# Patient Record
Sex: Female | Born: 1970 | Race: Black or African American | Hispanic: No | Smoking: Never smoker
Health system: Southern US, Community
[De-identification: ages and names within clinical notes are randomized; demographics above are authoritative.]

## PROBLEM LIST (undated history)

## (undated) DIAGNOSIS — E611 Iron deficiency: Secondary | ICD-10-CM

## (undated) DIAGNOSIS — J189 Pneumonia, unspecified organism: Secondary | ICD-10-CM

## (undated) DIAGNOSIS — M3320 Polymyositis, organ involvement unspecified: Secondary | ICD-10-CM

## (undated) DIAGNOSIS — R7989 Other specified abnormal findings of blood chemistry: Secondary | ICD-10-CM

## (undated) DIAGNOSIS — D649 Anemia, unspecified: Secondary | ICD-10-CM

## (undated) DIAGNOSIS — Z8719 Personal history of other diseases of the digestive system: Secondary | ICD-10-CM

## (undated) DIAGNOSIS — M1990 Unspecified osteoarthritis, unspecified site: Secondary | ICD-10-CM

## (undated) DIAGNOSIS — K219 Gastro-esophageal reflux disease without esophagitis: Secondary | ICD-10-CM

## (undated) DIAGNOSIS — N189 Chronic kidney disease, unspecified: Secondary | ICD-10-CM

## (undated) DIAGNOSIS — M3500 Sicca syndrome, unspecified: Secondary | ICD-10-CM

## (undated) HISTORY — DX: Anemia, unspecified: D64.9

## (undated) HISTORY — DX: Other specified abnormal findings of blood chemistry: R79.89

## (undated) HISTORY — PX: KNEE SURGERY: SHX244

## (undated) HISTORY — PX: BUNIONECTOMY: SHX129

## (undated) HISTORY — PX: MUSCLE BIOPSY: SHX716

## (undated) HISTORY — DX: Sjogren syndrome, unspecified: M35.00

## (undated) HISTORY — PX: PLANTAR FASCIA SURGERY: SHX746

## (undated) HISTORY — PX: SHOULDER ARTHROSCOPY: SHX128

## (undated) HISTORY — DX: Iron deficiency: E61.1

---

## 1997-09-06 ENCOUNTER — Emergency Department (HOSPITAL_COMMUNITY): Admission: EM | Admit: 1997-09-06 | Discharge: 1997-09-06 | Payer: Self-pay | Admitting: Emergency Medicine

## 1997-12-22 ENCOUNTER — Inpatient Hospital Stay (HOSPITAL_COMMUNITY): Admission: AD | Admit: 1997-12-22 | Discharge: 1997-12-22 | Payer: Self-pay | Admitting: Obstetrics and Gynecology

## 1997-12-23 ENCOUNTER — Encounter: Payer: Self-pay | Admitting: Obstetrics and Gynecology

## 1997-12-24 ENCOUNTER — Ambulatory Visit (HOSPITAL_COMMUNITY): Admission: RE | Admit: 1997-12-24 | Discharge: 1997-12-24 | Payer: Self-pay | Admitting: Obstetrics and Gynecology

## 1997-12-31 ENCOUNTER — Ambulatory Visit (HOSPITAL_COMMUNITY): Admission: RE | Admit: 1997-12-31 | Discharge: 1997-12-31 | Payer: Self-pay | Admitting: Obstetrics and Gynecology

## 1998-07-02 ENCOUNTER — Other Ambulatory Visit: Admission: RE | Admit: 1998-07-02 | Discharge: 1998-07-02 | Payer: Self-pay | Admitting: Obstetrics and Gynecology

## 1998-08-21 ENCOUNTER — Encounter: Payer: Self-pay | Admitting: Obstetrics and Gynecology

## 1998-08-21 ENCOUNTER — Ambulatory Visit (HOSPITAL_COMMUNITY): Admission: RE | Admit: 1998-08-21 | Discharge: 1998-08-21 | Payer: Self-pay | Admitting: Obstetrics and Gynecology

## 1998-11-11 ENCOUNTER — Encounter: Payer: Self-pay | Admitting: Obstetrics and Gynecology

## 1998-11-11 ENCOUNTER — Ambulatory Visit (HOSPITAL_COMMUNITY): Admission: RE | Admit: 1998-11-11 | Discharge: 1998-11-11 | Payer: Self-pay | Admitting: Obstetrics and Gynecology

## 1999-01-07 ENCOUNTER — Inpatient Hospital Stay (HOSPITAL_COMMUNITY): Admission: AD | Admit: 1999-01-07 | Discharge: 1999-01-10 | Payer: Self-pay | Admitting: Obstetrics and Gynecology

## 1999-01-07 ENCOUNTER — Encounter (INDEPENDENT_AMBULATORY_CARE_PROVIDER_SITE_OTHER): Payer: Self-pay | Admitting: Specialist

## 1999-06-23 ENCOUNTER — Other Ambulatory Visit: Admission: RE | Admit: 1999-06-23 | Discharge: 1999-06-23 | Payer: Self-pay | Admitting: Obstetrics and Gynecology

## 1999-07-01 ENCOUNTER — Encounter: Payer: Self-pay | Admitting: Gastroenterology

## 1999-07-01 ENCOUNTER — Ambulatory Visit (HOSPITAL_COMMUNITY): Admission: RE | Admit: 1999-07-01 | Discharge: 1999-07-01 | Payer: Self-pay | Admitting: Gastroenterology

## 1999-10-13 ENCOUNTER — Encounter: Payer: Self-pay | Admitting: Gastroenterology

## 1999-10-13 ENCOUNTER — Ambulatory Visit (HOSPITAL_COMMUNITY): Admission: RE | Admit: 1999-10-13 | Discharge: 1999-10-13 | Payer: Self-pay | Admitting: Gastroenterology

## 1999-10-23 ENCOUNTER — Ambulatory Visit (HOSPITAL_COMMUNITY): Admission: RE | Admit: 1999-10-23 | Discharge: 1999-10-23 | Payer: Self-pay | Admitting: Gastroenterology

## 2000-11-25 ENCOUNTER — Encounter: Payer: Self-pay | Admitting: Emergency Medicine

## 2000-11-25 ENCOUNTER — Emergency Department (HOSPITAL_COMMUNITY): Admission: EM | Admit: 2000-11-25 | Discharge: 2000-11-25 | Payer: Self-pay | Admitting: Emergency Medicine

## 2000-12-27 ENCOUNTER — Other Ambulatory Visit: Admission: RE | Admit: 2000-12-27 | Discharge: 2000-12-27 | Payer: Self-pay | Admitting: Obstetrics and Gynecology

## 2001-06-16 ENCOUNTER — Ambulatory Visit (HOSPITAL_COMMUNITY): Admission: RE | Admit: 2001-06-16 | Discharge: 2001-06-16 | Payer: Self-pay | Admitting: Obstetrics and Gynecology

## 2001-06-16 ENCOUNTER — Encounter: Payer: Self-pay | Admitting: Obstetrics and Gynecology

## 2001-07-08 ENCOUNTER — Ambulatory Visit (HOSPITAL_COMMUNITY): Admission: RE | Admit: 2001-07-08 | Discharge: 2001-07-08 | Payer: Self-pay | Admitting: Obstetrics and Gynecology

## 2002-01-23 ENCOUNTER — Other Ambulatory Visit: Admission: RE | Admit: 2002-01-23 | Discharge: 2002-01-23 | Payer: Self-pay | Admitting: Obstetrics and Gynecology

## 2003-01-29 ENCOUNTER — Other Ambulatory Visit: Admission: RE | Admit: 2003-01-29 | Discharge: 2003-01-29 | Payer: Self-pay | Admitting: Obstetrics and Gynecology

## 2005-05-29 ENCOUNTER — Emergency Department (HOSPITAL_COMMUNITY): Admission: EM | Admit: 2005-05-29 | Discharge: 2005-05-29 | Payer: Self-pay | Admitting: Emergency Medicine

## 2007-04-22 ENCOUNTER — Emergency Department (HOSPITAL_COMMUNITY): Admission: EM | Admit: 2007-04-22 | Discharge: 2007-04-22 | Payer: Self-pay | Admitting: Emergency Medicine

## 2007-05-10 ENCOUNTER — Emergency Department (HOSPITAL_COMMUNITY): Admission: EM | Admit: 2007-05-10 | Discharge: 2007-05-11 | Payer: Self-pay

## 2009-10-27 ENCOUNTER — Emergency Department (HOSPITAL_COMMUNITY): Admission: EM | Admit: 2009-10-27 | Discharge: 2009-10-27 | Payer: Self-pay | Admitting: Emergency Medicine

## 2009-11-04 ENCOUNTER — Encounter: Admission: RE | Admit: 2009-11-04 | Discharge: 2009-11-04 | Payer: Self-pay | Admitting: Family Medicine

## 2010-07-04 NOTE — Op Note (Signed)
[facility]  Patient:    Connie Brady, Connie Brady Visit Number: [id number] MRN: [id number]          Service Type: DSU Location: DAY Attending Physician:  Michaele Offer Dictated by:   Zenaida Niece, M.D. Proc. Date: 07/08/01 Admit Date:  07/08/2001 Discharge Date: 07/08/2001                             Operative Report  PREOPERATIVE DIAGNOSIS:  Pelvic pain.  POSTOPERATIVE DIAGNOSIS:  Pelvic adhesions.  PROCEDURE:  Laparoscopy with lysis of adhesions.  SURGEON:  Zenaida Niece, M.D.  ANESTHESIA:  General endotracheal tube.  ESTIMATED BLOOD LOSS:  Less than 50 cc.  FINDINGS:  Slightly enlarged uterus with evidence of prior tubal ligation. There was a small adhesion on the right tube to the ovary.  The lower portion of the anterior uterus on the left was adherent to the anterior abdominal wall.  There were normal ovaries and a normal appendix, liver edge, and gallbladder.  DESCRIPTION OF PROCEDURE:  The patient was taken to the operating room and placed in the dorsal supine position.  General anesthesia was induced and she was placed in mobile stirrups.  The abdomen was prepped and draped in the usual sterile fashion.  The bladder drained with a red rubber catheter and a Hulka tenaculum applied to her cervix for uterine manipulation.  Upon pulling the uterus inferiorly, it did appear that there was an adhesion of the uterus to the anterior abdominal wall.  Her infraumbilical skin was then infiltrated with 0.25% Marcaine.  The Veress needle was inserted to the peritoneal cavity and placement confirmed by the water drop test and an opening pressure of 5 mmHg.  Approximately 3 L of CO2 gas were insufflated and the Veress needle was removed.  The tail of the disposable trocar was then introduced and placement confirmed by the laparoscope.  A 5 mm port was then placed under direct visualization in the low midline using 0.25% Marcaine.   Inspection revealed the above mentioned findings.  The uterus is enlarged and slightly globular, possibly consistent with adenomyosis.  The only adhesions on the right side were one small adhesion of the right tube to the right ovary and this was taken down sharply.  The more significant adhesion was below anterior left uterus adherent to the anterior abdominal wall.  I attempted to use monopolar scissors, but could not get these to coagulate.  I then used bipolar cautery for hemostasis and used scissors to cut this adhesion.  This was taken down to free the uterus anteriorly.  A small adhesion around the left round ligament was also taken down sharply and bleeding controlled with bipolar cautery.  I did not lyse the entire adhesion of the lower left anterior uterus due to the fact that I could not 100% guarantee that the bladder was not very close by.  I got down as low as I could.  Again, bipolar cautery was used to maintain hemostasis.  This did achieve adequate mobility of the uterus.  All sites were inspected and found to be hemostatic.  The 5 mm trocar was then removed under direct visualization.  All gas was allowed to deflate from the abdomen and the 10-11 disposable trocar was removed.  The skin incisions were closed with interrupted subcuticular sutures of 4-0 Vicryl followed by Steri-Strips and band-aids.  The Hulka tenaculum was then removed.  The patient was awakened in the  operating room, tolerated the procedure well, and was taken to the recovery room in stable condition. Dictated by:   Zenaida Niece, M.D. Attending Physician:  Michaele Offer DD:  07/08/01 TD:  07/11/01 Job: [phone] [id number]

## 2010-07-04 NOTE — Op Note (Signed)
[facility]  Patient:    Connie Brady                       MRN: [id number] Proc. Date: 01/08/99 Adm. Date:  [id number] Attending:  Oliver Pila                           Operative Report  PREOPERATIVE DIAGNOSIS:  Intrauterine pregnancy at 38 weeks, previous cesarean section, desires surgical sterility.  POSTOPERATIVE DIAGNOSIS:  Intrauterine pregnancy at 38 weeks, previous cesarean  section, desires surgical sterility.  Meconium stained amniotic fluid.  OPERATION:  Repeat low transverse cesarean section with bilateral partial salpingectomy.  SURGEON:  Zenaida Niece, M.D.  ANESTHESIA:  Spinal.  ESTIMATED BLOOD LOSS:  800 cc.  ____________ PROPHYLAXIS:  Ancef 1 gm after cord clamp.  FINDINGS:  Normal female anatomy and delivery of a viable female infant with Apgars of 8 and 9 that weighed 8 pounds 4 ounces.  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  COUNTS:  Correct.  CONDITION:  Stable.  DESCRIPTION OF PROCEDURE:  After appropriate informed consent was obtained, the  patient was taken to the operating room and placed in a sitting position. Ellison Hughs., M.D. instilled spinal anesthesia and she was placed n dorsal supine position with left lateral tilt.  Her abdomen was prepped and draped in the usual sterile fashion and a Foley catheter inserted.  The level of her anesthesia was found to be adequate and her abdomen was entered via her previous Pfannenstiel scar.  The vesicouterine peritoneum was incised and the bladder flap c created digitally and the bladder blade was placed to retract the bladder.  A 4 cm transverse incision was made in the lower uterine segment and the incision was extended bilaterally digitally.  An Allis clamp was used to rupture membranes which revealed moderate to thick meconium stained fluid.  The fetal vertex was grasped and delivered through  the incision atraumatically.  The mouth and nares were suctioned with DeLee suction with return of a fair amount of moderate meconium.  The remainder of the infant then delivered atraumatically.  The cord was doubly  clamped and cut and the infant handed to the awaiting pediatric team.  Cord blood and a segment of cord for gas were obtained and the placenta delivered spontaneously.  The uterus was then wiped dry with a clean lap pad and all clots and debris removed and the uterus palpated normal.  The incision was inspected nd found to be free of ____________ .  It was then closed in one layer being running locking layer of #1 chromic.  Bleeding from serosal edges was controlled with electrocautery to achieve adequate hemostasis.  Both fallopian tubes were then identified and traced to their fimbriated ends.  A knuckle of tube on each side was ligated with 0 and plain gut suture and the knuckle of tube removed sharply. On both sides, the tubal ostia were identified and the stumps were hemostatic. The uterine incision was then again inspected and found to be hemostatic.  The subfascial space was then irrigated and remained hemostatic with electrocautery. The fascia was closed in a running fashion starting at both ends and meeting in the middle with 0 Vicryl.  The subcutaneous tissues were then irrigated and made hemostatic with electrocautery and the skin was closed with staples followed by a sterile  dressing.  The patient tolerated the procedure well and was taken to the recovery room in stable condition. DD:  01/08/99 TD:  01/09/99 Job: [id number]

## 2010-07-04 NOTE — Procedures (Signed)
[facility]  Patient:    Connie Brady, Connie Brady                      MRN: [id number] Proc. Date: 10/23/99 Adm. Date:  [id number] Attending:  Nelda Marseille CC:         Zenaida Niece, M.D.   Procedure Report  PROCEDURE:  Colonoscopy.  INDICATION:  Chronic lower abdominal pain requesting GI work-up prior to laparoscopy with some occasional periodic bleeding.  INFORMED CONSENT:  Consent was signed after risk, benefits, methods and options were thoroughly discussed in the office.  MEDICINES USED:  Demerol 70 mg, Versed 7 mg.  DESCRIPTION OF PROCEDURE:  Rectal inspection is pertinent for small external hemorrhoids.  Digital examination was negative.  The video colonoscope was inserted, easily advanced around the colon to the cecum.  This did require some left lower quadrant pressure but no position changes.  The cecum was identified by the appendiceal orifice and the ileocecal valve.  No significant abnormality was seen on insertion.  The scope was inserted short ways in the ways in the terminal ileus which was normal.  Further augmentation was obtained.  The scope was slowly withdrawn.  The prep was adequate.  There was some liquid stool and that required washing and suctioning, but on slow withdrawal through the colon no abnormalities were seen.  Once back in the rectum the scope was in retroflex, pertinent for some internal hemorrhoids. Anal and rectal pull-through confirmed the above.  The scope was reinserted a short way up the sigmoid.  Air was suctioned and scope removed.  The patient tolerated the procedure well.  There was no obvious immediate complications.  ENDOSCOPIC DIAGNOSIS: 1. Internal and external hemorrhoids. 2. Otherwise negative exam to the terminal ileum.  PLAN:  GI followup p.r.n. or in three months, recheck symptoms.  Otherwise followup with Dr. Jackelyn Knife to continue work-up with either a laparoscope or CT scan versus just  following clinically for now. DD:  10/23/99 TD:  10/25/99 Job: [id number]

## 2010-07-04 NOTE — H&P (Signed)
[facility]  Patient:    Connie Brady, Connie Brady Visit Number: [id number] MRN: [id number]          Service Type: Attending:  Zenaida Niece, M.D. Dictated by:   Zenaida Niece, M.D. Adm. Date:  07/08/01                           History and Physical  CHIEF COMPLAINT:  Pelvic pain.  HISTORY OF PRESENT ILLNESS:  This is a 40 year old black female, para 2-0-1-2, whom I saw for an annual exam in November of 2002.  She is having regular menses.  At that time she had no other complaints.  However, she called in April of 2003 complaining of mid pelvic pain for two years off and on.  The pain is low in the midline, unpredictable, and not associated with menses, urination, bowel movements, or coitus.  The pain is at least once a week and lasts minutes to hours and rates 4-5/10 on a pain scale.  No associated nausea, vomiting, fever, or chills.  She has normal bowel movements and normal urination.  Sometimes she feels like it is getting worse and other times she feels like it is not getting worse.  She has rarely missed work due to this pain.  She has had no relief with over-the-counter medications, including Aleve, ibuprofen, and Tylenol.  Exam at that time revealed slightly tender uterus, slight right adnexal tenderness, and suprapubic tenderness.  She had a pelvic ultrasound performed which was normal.  The patient wishes to proceed with diagnostic/operative laparoscopy for evaluation of this pain.  PAST OBSTETRICAL HISTORY:  Significant for two low transverse cesarean sections and a tubal ligation with her last cesarean section.  She has had one spontaneous abortion.  PAST GYNECOLOGICAL HISTORY:  History of ascus Pap smear with spontaneous resolution.  PAST MEDICAL HISTORY:  Significant for abdominal pain with a normal GI work-up.  PAST SURGICAL HISTORY:  Significant for the two cesarean sections and tubal ligation.  ALLERGIES:  None known.  CURRENT  MEDICATIONS:  None.  SOCIAL HISTORY:  The patient is married and denies alcohol, tobacco, or drug use.  REVIEW OF SYSTEMS:  Otherwise negative.  FAMILY HISTORY:  No GYN or colon cancer.  PHYSICAL EXAMINATION:  GENERAL APPEARANCE:  This is a well-developed, well-nourished, black female who is in no acute distress.  WEIGHT:  The last weight was 177 pounds.  LUNGS:  Clear to auscultation.  HEART:  Regular rate and rhythm without murmur.  NECK:  Supple without lymphadenopathy or thyromegaly.  ABDOMEN:  Soft and nondistended without a palpable mass.  She does have a transverse scar and mild suprapubic tenderness.  PELVIC:  External genitalia normal.  On bimanual exam, she has an anteverted uterus which is upper limits of normal size, slightly tenderness, and mild right adnexal tenderness without palpable mass.  ASSESSMENT:  Pelvic pain.  This pain is in the midline and I feel is probably due to adhesions from her prior cesarean section.  The patient wishes to proceed with surgical evaluation and treatment.  The risks of laparoscopy have been discussed with the patient, including bleeding, infection, and damage to the surrounding organs.  PLAN:  Admit the patient for diagnostic laparoscopy with possible adhesiolysis. Dictated by:   Zenaida Niece, M.D. Attending:  Zenaida Niece, M.D. DD:  07/05/01 TD:  07/05/01 Job: [id number]

## 2011-04-22 ENCOUNTER — Emergency Department (HOSPITAL_COMMUNITY)
Admission: EM | Admit: 2011-04-22 | Discharge: 2011-04-22 | Disposition: A | Discharge status: Home / Self Care | Payer: BC Managed Care – PPO | Attending: Emergency Medicine | Admitting: Emergency Medicine

## 2011-04-22 ENCOUNTER — Emergency Department (HOSPITAL_COMMUNITY): Payer: BC Managed Care – PPO

## 2011-04-22 ENCOUNTER — Other Ambulatory Visit: Payer: Self-pay

## 2011-04-22 ENCOUNTER — Encounter (HOSPITAL_COMMUNITY): Payer: Self-pay | Admitting: *Deleted

## 2011-04-22 DIAGNOSIS — R079 Chest pain, unspecified: Secondary | ICD-10-CM | POA: Insufficient documentation

## 2011-04-22 DIAGNOSIS — R51 Headache: Secondary | ICD-10-CM | POA: Insufficient documentation

## 2011-04-22 MED ORDER — IBUPROFEN 800 MG PO TABS: 800.0000 mg | ORAL_TABLET | Freq: Three times a day (TID) | ORAL | Status: AC

## 2011-04-22 MED ORDER — IBUPROFEN 800 MG PO TABS
800.0000 mg | ORAL_TABLET | Freq: Once | ORAL | Status: AC
  Administered 2011-04-22: 800 mg via ORAL
  Filled 2011-04-22: qty 1

## 2011-04-22 NOTE — Discharge Instructions (Signed)
A specific cause of your chest pain is demonstrated through today's emergency department evaluation.  Your presentation, her vital signs, your electrocardiogram, and your chest x-ray are all most consistent with either muscle strain or costochondritis. If he develops any new, or concerning changes in your condition, please return the emergency department immediately for additional evaluation.

## 2011-04-22 NOTE — ED Notes (Signed)
Pt states "the chest pain started about 0730 this morning, the headache started after, sometimes the pain feels like it goes into my back, feel like pressure"

## 2011-04-22 NOTE — ED Provider Notes (Signed)
History     CSN: [id number]  Arrival date & time 04/22/11  1028   First MD Initiated Contact with Patient 04/22/11 1036      Chief Complaint  Patient presents with  . Headache  . Chest Pain     HPI Patient presents with chest pain and headache.  She notes one similar prior episode approximately 3 weeks ago, the last several days.  She has otherwise been generally in her usual state of health.  Today, approximately 5 hours ago, she noticed pain in her right chest.  The patient is focally about her right superior chest with mild radiation towards midline.  The pain is no clear alleviating or exacerbating factors.  The pain is sharp, pressure-like.  The patient has not attempted any relief with analgesics.  She denies any pleuritic or exertional worsening.  She denies any confusion, visual changes, nausea, vomiting, fevers, chills. She is a nonsmoker, she does not take birth control, last menstrual period was one week ago, no recent travel. History reviewed. No pertinent past medical history.  Past Surgical History  Procedure Date  . Knee surgery     right    No family history on file.  History  Substance Use Topics  . Smoking status: Never Smoker   . Smokeless tobacco: Not on file  . Alcohol Use: No    OB History    Grav Para Term Preterm Abortions TAB SAB Ect Mult Living                  Review of Systems  Constitutional:       HPI  HENT:       HPI otherwise negative  Eyes: Negative.   Respiratory:       HPI, otherwise negative  Cardiovascular:       HPI, otherwise nmegative  Gastrointestinal: Negative for vomiting.  Genitourinary:       HPI, otherwise negative  Musculoskeletal:       HPI, otherwise negative  Skin: Negative.   Neurological: Negative for syncope.    Allergies  Review of patient's allergies indicates not on file.  Home Medications  No current outpatient prescriptions on file.  BP 114/78  Pulse 64  Temp(Src) 97.7 F (36.5 C) (Oral)   Resp 13  Wt 210 lb (95.255 kg)  SpO2 100%  LMP 04/14/2011  Physical Exam  Nursing note and vitals reviewed. Constitutional: She is oriented to person, place, and time. She appears well-developed and well-nourished. No distress.  HENT:  Head: Normocephalic and atraumatic.  Eyes: Conjunctivae and EOM are normal.  Cardiovascular: Normal rate and regular rhythm.   Pulmonary/Chest: Effort normal and breath sounds normal. No stridor. No respiratory distress.    Abdominal: She exhibits no distension.  Musculoskeletal: She exhibits no edema.  Neurological: She is alert and oriented to person, place, and time. No cranial nerve deficit.  Skin: Skin is warm and dry.  Psychiatric: She has a normal mood and affect.    ED Course  Procedures (including critical care time)  Labs Reviewed - No data to display No results found.   No diagnosis found.  Cardiac monitor 61 sinus rhythm normal Pulse oximetry 100% room air normal Chest x-ray reviewed by me   Date: 04/22/2011  Rate: 63  Rhythm: normal sinus rhythm  QRS Axis: normal  Intervals: normal  ST/T Wave abnormalities: normal  Conduction Disutrbances:none  Narrative Interpretation:   Old EKG Reviewed: unchanged from prior EARLY R/S Progression Borderline ECG   MDM  This generally well female presents with several hours of right chest pain.  Patient's unremarkable vital signs, her tenderness to palpation of the area of complaint, her chest x-ray and ECG are all reassuring, and suggestive of costochondritis or muscle strain is the etiology of her complaint.  The patient has no notable risk factors for ACS, nor does she have any risk factors for PE.  The patient was discharged in stable condition with instructions to continue a course of anti-inflammatories and follow up with her primary care physician.        Gerhard Munch, MD 04/22/11 1130

## 2011-11-26 ENCOUNTER — Encounter (INDEPENDENT_AMBULATORY_CARE_PROVIDER_SITE_OTHER): Payer: Self-pay

## 2011-12-01 ENCOUNTER — Ambulatory Visit (INDEPENDENT_AMBULATORY_CARE_PROVIDER_SITE_OTHER): Payer: BC Managed Care – PPO | Admitting: General Surgery

## 2011-12-01 ENCOUNTER — Encounter (INDEPENDENT_AMBULATORY_CARE_PROVIDER_SITE_OTHER): Payer: Self-pay | Admitting: General Surgery

## 2011-12-01 ENCOUNTER — Telehealth (INDEPENDENT_AMBULATORY_CARE_PROVIDER_SITE_OTHER): Payer: Self-pay

## 2011-12-01 VITALS — BP 124/68 | HR 72 | Temp 97.4°F | Resp 16 | Ht 68.0 in | Wt 216.0 lb

## 2011-12-01 DIAGNOSIS — M791 Myalgia, unspecified site: Secondary | ICD-10-CM

## 2011-12-01 DIAGNOSIS — IMO0001 Reserved for inherently not codable concepts without codable children: Secondary | ICD-10-CM

## 2011-12-01 NOTE — Telephone Encounter (Signed)
Called [place] [facility] [phone] (Dr. Corliss Skains) medical records request.

## 2011-12-01 NOTE — Telephone Encounter (Signed)
Medical records rec'd from [facility] (Dr. Corliss Skains)

## 2011-12-01 NOTE — Progress Notes (Signed)
Patient ID: Connie Brady, female   DOB: 1970/11/27, 41 y.o.   MRN: [id number]  Chief Complaint  Patient presents with  . Pre-op Exam    eval for muscle Bx    HPI Connie Brady is a 41 y.o. female Referred by Dr. Drusilla Brady for muscle biopsy. 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 Patient appears normal myositis. Chills were complains of a history of plantar fasciitis as well as extended muscle healing. 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 HPI  Past Medical History  Diagnosis Date  . Anemia     Past Surgical History  Procedure Date  . Knee surgery     right    History reviewed. No pertinent family history.  Social History History  Substance Use Topics  . Smoking status: Never Smoker   . Smokeless tobacco: Not on file  . Alcohol Use: No    Allergies  Allergen Reactions  . Percocet (Oxycodone-Acetaminophen) Rash and Other (See Comments)    Hallucinations (does not know if it is hydrocodone or apap)    Current Outpatient Prescriptions  Medication Sig Dispense Refill  . AMRIX 15 MG 24 hr capsule       . gabapentin (NEURONTIN) 300 MG capsule         Review of Systems Review of Systems  Blood pressure 124/68, pulse 72, temperature 97.4 F (36.3 C), temperature source Temporal, resp. rate 16, height 5\' 8"  (1.727 m), weight 216 lb (97.977 kg).  Physical Exam Physical Exam  Data Reviewed CCK 777  Assessment    The patient is a 41 year old female with elevated muscle enzymes. Patient is referred for muscle biopsy by Dr. Corliss Brady.      Plan    1. With the patient for a right quadriceps muscle biopsy.  2. All risks benefits first point of the patient to include infection bleeding and damage to surrounding structures. This was understood by the patient and wishes to proceed.       Connie Brady., Connie Brady 12/01/2011, 2:34 PM

## 2011-12-08 ENCOUNTER — Other Ambulatory Visit (INDEPENDENT_AMBULATORY_CARE_PROVIDER_SITE_OTHER): Payer: Self-pay | Admitting: General Surgery

## 2011-12-08 DIAGNOSIS — M6281 Muscle weakness (generalized): Secondary | ICD-10-CM

## 2011-12-23 ENCOUNTER — Encounter (INDEPENDENT_AMBULATORY_CARE_PROVIDER_SITE_OTHER): Payer: Self-pay | Admitting: General Surgery

## 2011-12-23 ENCOUNTER — Ambulatory Visit (INDEPENDENT_AMBULATORY_CARE_PROVIDER_SITE_OTHER): Payer: BC Managed Care – PPO | Admitting: General Surgery

## 2011-12-23 VITALS — BP 116/74 | HR 72 | Temp 98.0°F | Resp 16 | Ht 68.0 in | Wt 213.0 lb

## 2011-12-23 DIAGNOSIS — Z9889 Other specified postprocedural states: Secondary | ICD-10-CM

## 2011-12-23 NOTE — Progress Notes (Signed)
Patient ID: Connie Brady, female   DOB: [birth year], 41 y.o.   MRN: [id number] The patient is a 41 year old female status post right quadricep muscle biopsy for elevated CKs. The patient was doing well aside from some soreness. He has had no other problems at this time  On exam:  Wounds clean dry intact approximately percent healed there is a small amount of scar tissue below the skin was fractured towards the next several weeks  Assessment and plan:  Status post muscle biopsy patient's follow up with Dr. Drusilla Kanner for discussion of pathology results. Patient following a when necessary

## 2012-01-27 ENCOUNTER — Encounter (HOSPITAL_COMMUNITY): Payer: Self-pay | Admitting: Emergency Medicine

## 2012-01-27 ENCOUNTER — Emergency Department (HOSPITAL_COMMUNITY)
Admission: EM | Admit: 2012-01-27 | Discharge: 2012-01-27 | Disposition: A | Discharge status: Home / Self Care | Payer: No Typology Code available for payment source | Attending: Emergency Medicine | Admitting: Emergency Medicine

## 2012-01-27 ENCOUNTER — Emergency Department (HOSPITAL_COMMUNITY): Payer: No Typology Code available for payment source

## 2012-01-27 DIAGNOSIS — M25519 Pain in unspecified shoulder: Secondary | ICD-10-CM | POA: Insufficient documentation

## 2012-01-27 DIAGNOSIS — S161XXA Strain of muscle, fascia and tendon at neck level, initial encounter: Secondary | ICD-10-CM

## 2012-01-27 DIAGNOSIS — D649 Anemia, unspecified: Secondary | ICD-10-CM | POA: Insufficient documentation

## 2012-01-27 DIAGNOSIS — Y92410 Unspecified street and highway as the place of occurrence of the external cause: Secondary | ICD-10-CM | POA: Insufficient documentation

## 2012-01-27 DIAGNOSIS — Y939 Activity, unspecified: Secondary | ICD-10-CM | POA: Insufficient documentation

## 2012-01-27 DIAGNOSIS — Z79899 Other long term (current) drug therapy: Secondary | ICD-10-CM | POA: Insufficient documentation

## 2012-01-27 DIAGNOSIS — M542 Cervicalgia: Secondary | ICD-10-CM | POA: Insufficient documentation

## 2012-01-27 HISTORY — DX: Polymyositis, organ involvement unspecified: M33.20

## 2012-01-27 MED ORDER — HYDROCODONE-ACETAMINOPHEN 5-325 MG PO TABS
1.0000 | ORAL_TABLET | Freq: Once | ORAL | Status: AC
  Administered 2012-01-27: 1 via ORAL
  Filled 2012-01-27: qty 1

## 2012-01-27 MED ORDER — IBUPROFEN 800 MG PO TABS: 800.0000 mg | ORAL_TABLET | Freq: Three times a day (TID) | ORAL | Status: DC | PRN

## 2012-01-27 MED ORDER — HYDROCODONE-ACETAMINOPHEN 5-325 MG PO TABS: 1.0000 | ORAL_TABLET | Freq: Four times a day (QID) | ORAL | Status: DC | PRN

## 2012-01-27 NOTE — ED Notes (Signed)
Patient transported to X-ray 
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
## 2012-02-04 NOTE — ED Provider Notes (Deleted)
Medical screening examination/treatment/procedure(s) were performed by non-physician practitioner and as supervising physician I was immediately available for consultation/collaboration.   Joya Gaskins, MD 02/04/12 [phone]

## 2012-03-15 ENCOUNTER — Encounter (INDEPENDENT_AMBULATORY_CARE_PROVIDER_SITE_OTHER): Payer: Self-pay

## 2012-05-24 ENCOUNTER — Encounter (INDEPENDENT_AMBULATORY_CARE_PROVIDER_SITE_OTHER): Payer: Self-pay

## 2012-11-30 ENCOUNTER — Ambulatory Visit (INDEPENDENT_AMBULATORY_CARE_PROVIDER_SITE_OTHER): Payer: BC Managed Care – PPO | Admitting: Podiatry

## 2012-11-30 ENCOUNTER — Encounter: Payer: Self-pay | Admitting: Podiatry

## 2012-11-30 ENCOUNTER — Ambulatory Visit (INDEPENDENT_AMBULATORY_CARE_PROVIDER_SITE_OTHER): Payer: BC Managed Care – PPO

## 2012-11-30 VITALS — BP 130/80 | HR 88 | Resp 17 | Wt 230.0 lb

## 2012-11-30 DIAGNOSIS — M79609 Pain in unspecified limb: Secondary | ICD-10-CM

## 2012-11-30 DIAGNOSIS — S93609A Unspecified sprain of unspecified foot, initial encounter: Secondary | ICD-10-CM

## 2012-11-30 DIAGNOSIS — G8929 Other chronic pain: Secondary | ICD-10-CM

## 2012-11-30 DIAGNOSIS — M722 Plantar fascial fibromatosis: Secondary | ICD-10-CM

## 2012-11-30 DIAGNOSIS — M3500 Sicca syndrome, unspecified: Secondary | ICD-10-CM | POA: Insufficient documentation

## 2012-11-30 MED ORDER — MELOXICAM 15 MG PO TABS: 15.0000 mg | ORAL_TABLET | Freq: Every day | ORAL | Status: DC

## 2012-11-30 NOTE — Progress Notes (Signed)
  Subjective:    Patient ID: Connie Brady, female    DOB: [birth year], 42 y.o.   MRN: [id number]  HPI N FOOT INJURY        L L MEDIAL PLANTAR ARCH        D 11/29/2012         O SUDDEN         C THROBBING PAIN THAT RADIATES UP SHIN        A NO STIMULUS NEEDED        T ICE    Review of Systems  Constitutional: Negative.   Eyes:       DRY EYES AND MOUTH  Respiratory: Negative.   Cardiovascular: Positive for chest pain.  Genitourinary: Negative.   Musculoskeletal: Positive for myalgias.  Skin: Negative.   Allergic/Immunologic: Negative.   Neurological: Negative.   Hematological: Negative.   Psychiatric/Behavioral: Negative.        Objective:   Physical Exam        Assessment & Plan:
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
## 2012-12-06 ENCOUNTER — Ambulatory Visit (INDEPENDENT_AMBULATORY_CARE_PROVIDER_SITE_OTHER): Payer: BC Managed Care – PPO | Admitting: Podiatry

## 2012-12-06 ENCOUNTER — Encounter: Payer: Self-pay | Admitting: Podiatry

## 2012-12-06 VITALS — BP 107/64 | HR 96 | Resp 12

## 2012-12-06 DIAGNOSIS — M722 Plantar fascial fibromatosis: Secondary | ICD-10-CM
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
## 2012-12-06 NOTE — Patient Instructions (Signed)
Plantar Fasciitis (Heel Spur Syndrome) with Rehab The plantar fascia is a fibrous, ligament-like, soft-tissue structure that spans the bottom of the foot. Plantar fasciitis is a condition that causes pain in the foot due to inflammation of the tissue. SYMPTOMS   Pain and tenderness on the underneath side of the foot.  Pain that worsens with standing or walking. CAUSES  Plantar fasciitis is caused by irritation and injury to the plantar fascia on the underneath side of the foot. Common mechanisms of injury include:  Direct trauma to bottom of the foot.  Damage to a small nerve that runs under the foot where the main fascia attaches to the heel bone.  Stress placed on the plantar fascia due to bone spurs. RISK INCREASES WITH:   Activities that place stress on the plantar fascia (running, jumping, pivoting, or cutting).  Poor strength and flexibility.  Improperly fitted shoes.  Tight calf muscles.  Flat feet.  Failure to warm-up properly before activity.  Obesity. PREVENTION  Warm up and stretch properly before activity.  Allow for adequate recovery between workouts.  Maintain physical fitness:  Strength, flexibility, and endurance.  Cardiovascular fitness.  Maintain a health body weight.  Avoid stress on the plantar fascia.  Wear properly fitted shoes, including arch supports for individuals who have flat feet. PROGNOSIS  If treated properly, then the symptoms of plantar fasciitis usually resolve without surgery. However, occasionally surgery is necessary. RELATED COMPLICATIONS   Recurrent symptoms that may result in a chronic condition.  Problems of the lower back that are caused by compensating for the injury, such as limping.  Pain or weakness of the foot during push-off following surgery.  Chronic inflammation, scarring, and partial or complete fascia tear, occurring more often from repeated injections. TREATMENT  Treatment initially involves the use of  ice and medication to help reduce pain and inflammation. The use of strengthening and stretching exercises may help reduce pain with activity, especially stretches of the Achilles tendon. These exercises may be performed at home or with a therapist. Your caregiver may recommend that you use heel cups of arch supports to help reduce stress on the plantar fascia. Occasionally, corticosteroid injections are given to reduce inflammation. If symptoms persist for greater than 6 months despite non-surgical (conservative), then surgery may be recommended.  MEDICATION   If pain medication is necessary, then nonsteroidal anti-inflammatory medications, such as aspirin and ibuprofen, or other minor pain relievers, such as acetaminophen, are often recommended.  Do not take pain medication within 7 days before surgery.  Prescription pain relievers may be given if deemed necessary by your caregiver. Use only as directed and only as much as you need.  Corticosteroid injections may be given by your caregiver. These injections should be reserved for the most serious cases, because they may only be given a certain number of times. HEAT AND COLD  Cold treatment (icing) relieves pain and reduces inflammation. Cold treatment should be applied for 10 to 15 minutes every 2 to 3 hours for inflammation and pain and immediately after any activity that aggravates your symptoms. Use ice packs or massage the area with a piece of ice (ice massage).  Heat treatment may be used prior to performing the stretching and strengthening activities prescribed by your caregiver, physical therapist, or athletic trainer. Use a heat pack or soak the injury in warm water. SEEK IMMEDIATE MEDICAL CARE IF:  Treatment seems to offer no benefit, or the condition worsens.  Any medications produce adverse side effects. EXERCISES RANGE   OF MOTION (ROM) AND STRETCHING EXERCISES - Plantar Fasciitis (Heel Spur Syndrome) These exercises may help you  when beginning to rehabilitate your injury. Your symptoms may resolve with or without further involvement from your physician, physical therapist or athletic trainer. While completing these exercises, remember:   Restoring tissue flexibility helps normal motion to return to the joints. This allows healthier, less painful movement and activity.  An effective stretch should be held for at least 30 seconds.  A stretch should never be painful. You should only feel a gentle lengthening or release in the stretched tissue. RANGE OF MOTION - Toe Extension, Flexion  Sit with your right / left leg crossed over your opposite knee.  Grasp your toes and gently pull them back toward the top of your foot. You should feel a stretch on the bottom of your toes and/or foot.  Hold this stretch for __________ seconds.  Now, gently pull your toes toward the bottom of your foot. You should feel a stretch on the top of your toes and or foot.  Hold this stretch for __________ seconds. Repeat __________ times. Complete this stretch __________ times per day.  RANGE OF MOTION - Ankle Dorsiflexion, Active Assisted  Remove shoes and sit on a chair that is preferably not on a carpeted surface.  Place right / left foot under knee. Extend your opposite leg for support.  Keeping your heel down, slide your right / left foot back toward the chair until you feel a stretch at your ankle or calf. If you do not feel a stretch, slide your bottom forward to the edge of the chair, while still keeping your heel down.  Hold this stretch for __________ seconds. Repeat __________ times. Complete this stretch __________ times per day.  STRETCH  Gastroc, Standing  Place hands on wall.  Extend right / left leg, keeping the front knee somewhat bent.  Slightly point your toes inward on your back foot.  Keeping your right / left heel on the floor and your knee straight, shift your weight toward the wall, not allowing your back to  arch.  You should feel a gentle stretch in the right / left calf. Hold this position for __________ seconds. Repeat __________ times. Complete this stretch __________ times per day. STRETCH  Soleus, Standing  Place hands on wall.  Extend right / left leg, keeping the other knee somewhat bent.  Slightly point your toes inward on your back foot.  Keep your right / left heel on the floor, bend your back knee, and slightly shift your weight over the back leg so that you feel a gentle stretch deep in your back calf.  Hold this position for __________ seconds. Repeat __________ times. Complete this stretch __________ times per day. STRETCH  Gastrocsoleus, Standing  Note: This exercise can place a lot of stress on your foot and ankle. Please complete this exercise only if specifically instructed by your caregiver.   Place the ball of your right / left foot on a step, keeping your other foot firmly on the same step.  Hold on to the wall or a rail for balance.  Slowly lift your other foot, allowing your body weight to press your heel down over the edge of the step.  You should feel a stretch in your right / left calf.  Hold this position for __________ seconds.  Repeat this exercise with a slight bend in your right / left knee. Repeat __________ times. Complete this stretch __________ times per day.    STRENGTHENING EXERCISES - Plantar Fasciitis (Heel Spur Syndrome)  These exercises may help you when beginning to rehabilitate your injury. They may resolve your symptoms with or without further involvement from your physician, physical therapist or athletic trainer. While completing these exercises, remember:   Muscles can gain both the endurance and the strength needed for everyday activities through controlled exercises.  Complete these exercises as instructed by your physician, physical therapist or athletic trainer. Progress the resistance and repetitions only as guided. STRENGTH - Towel  Curls  Sit in a chair positioned on a non-carpeted surface.  Place your foot on a towel, keeping your heel on the floor.  Pull the towel toward your heel by only curling your toes. Keep your heel on the floor.  If instructed by your physician, physical therapist or athletic trainer, add ____________________ at the end of the towel. Repeat __________ times. Complete this exercise __________ times per day. STRENGTH - Ankle Inversion  Secure one end of a rubber exercise band/tubing to a fixed object (table, pole). Loop the other end around your foot just before your toes.  Place your fists between your knees. This will focus your strengthening at your ankle.  Slowly, pull your big toe up and in, making sure the band/tubing is positioned to resist the entire motion.  Hold this position for __________ seconds.  Have your muscles resist the band/tubing as it slowly pulls your foot back to the starting position. Repeat __________ times. Complete this exercises __________ times per day.  Document Released: 02/02/2005 Document Revised: 04/27/2011 Document Reviewed: 05/17/2008 [employer] Patient Information 2014 [employer]. Plantar Fasciitis Plantar fasciitis is a common condition that causes foot pain. It is soreness (inflammation) of the band of tough fibrous tissue on the bottom of the foot that runs from the heel bone (calcaneus) to the ball of the foot. The cause of this soreness may be from excessive standing, poor fitting shoes, running on hard surfaces, being overweight, having an abnormal walk, or overuse (this is common in runners) of the painful foot or feet. It is also common in aerobic exercise dancers and ballet dancers. SYMPTOMS  Most people with plantar fasciitis complain of:  Severe pain in the morning on the bottom of their foot especially when taking the first steps out of bed. This pain recedes after a few minutes of walking.  Severe pain is experienced also during walking  following a long period of inactivity.  Pain is worse when walking barefoot or up stairs DIAGNOSIS   Your caregiver will diagnose this condition by examining and feeling your foot.  Special tests such as X-rays of your foot, are usually not needed. PREVENTION   Consult a sports medicine professional before beginning a new exercise program.  Walking programs offer a good workout. With walking there is a lower chance of overuse injuries common to runners. There is less impact and less jarring of the joints.  Begin all new exercise programs slowly. If problems or pain develop, decrease the amount of time or distance until you are at a comfortable level.  Wear good shoes and replace them regularly.  Stretch your foot and the heel cords at the back of the ankle (Achilles tendon) both before and after exercise.  Run or exercise on even surfaces that are not hard. For example, asphalt is better than pavement.  Do not run barefoot on hard surfaces.  If using a treadmill, vary the incline.  Do not continue to workout if you have foot or joint   problems. Seek professional help if they do not improve. HOME CARE INSTRUCTIONS   Avoid activities that cause you pain until you recover.  Use ice or cold packs on the problem or painful areas after working out.  Only take over-the-counter or prescription medicines for pain, discomfort, or fever as directed by your caregiver.  Soft shoe inserts or athletic shoes with air or gel sole cushions may be helpful.  If problems continue or become more severe, consult a sports medicine caregiver or your own health care provider. Cortisone is a potent anti-inflammatory medication that may be injected into the painful area. You can discuss this treatment with your caregiver. MAKE SURE YOU:   Understand these instructions.  Will watch your condition.  Will get help right away if you are not doing well or get worse. Document Released: 10/28/2000 Document  Revised: 04/27/2011 Document Reviewed: 12/28/2007 [employer] Patient Information 2014 [employer].  

## 2013-01-05 ENCOUNTER — Ambulatory Visit (INDEPENDENT_AMBULATORY_CARE_PROVIDER_SITE_OTHER): Payer: BC Managed Care – PPO | Admitting: Podiatry

## 2013-01-05 ENCOUNTER — Encounter: Payer: Self-pay | Admitting: Podiatry

## 2013-01-05 VITALS — BP 110/73 | HR 76 | Resp 16 | Ht 68.0 in | Wt 227.0 lb

## 2013-01-05 DIAGNOSIS — M722 Plantar fascial fibromatosis: Secondary | ICD-10-CM

## 2013-01-05 NOTE — Progress Notes (Signed)
Connie Brady presents today for followup of her plantar fasciitis left heel. She states this proximally 70% better. She continues conservative therapies.  Objective: Vital signs are stable she is alert and oriented x3. She is no calf pain. She has some tenderness on palpation of the medial calcaneal tubercle at the point of maximal tenderness where the plantar fascia me to the calcaneus. There is no erythema edema associated with this. No ecchymosis.  Assessment: Resolving plantar fasciitis. Continue all conservative therapies.  Plan: Continue all conservative therapies. Followup with her on a when necessary basis.

## 2013-01-26 ENCOUNTER — Encounter: Payer: Self-pay | Admitting: *Deleted

## 2013-01-26 ENCOUNTER — Ambulatory Visit (INDEPENDENT_AMBULATORY_CARE_PROVIDER_SITE_OTHER): Payer: BC Managed Care – PPO | Admitting: Cardiology

## 2013-01-26 ENCOUNTER — Encounter: Payer: Self-pay | Admitting: Cardiology

## 2013-01-26 VITALS — BP 128/64 | HR 72 | Ht 68.0 in | Wt 220.0 lb

## 2013-01-26 DIAGNOSIS — M3320 Polymyositis, organ involvement unspecified: Secondary | ICD-10-CM

## 2013-01-26 DIAGNOSIS — M3500 Sicca syndrome, unspecified: Secondary | ICD-10-CM

## 2013-01-26 DIAGNOSIS — R079 Chest pain, unspecified: Secondary | ICD-10-CM | POA: Insufficient documentation

## 2013-01-26 MED ORDER — NITROGLYCERIN 0.4 MG SL SUBL: 0.4000 mg | SUBLINGUAL_TABLET | SUBLINGUAL | Status: DC | PRN

## 2013-01-26 NOTE — Addendum Note (Signed)
**Note De-Identified Talyssa Gibas Obfuscation** Addended by: Demetrios Loll on: 01/26/2013 11:58 AM   Modules accepted: Orders

## 2013-01-26 NOTE — Patient Instructions (Signed)
**Note De-Identified  Obfuscation** Your physician has requested that you have cardiac CT. Cardiac computed tomography (CT) is a painless test that uses an x-ray machine to take clear, detailed pictures of your heart. For further information please visit [email or web address] Please follow instruction sheet as given. Please schedule for next week with Dr Delton See or Dr Eden Emms.  Your physician recommends that you schedule a follow-up appointment in: after test

## 2013-01-26 NOTE — Progress Notes (Signed)
Patient ID: Connie Brady, female   DOB: 12-27-1970, 42 y.o.   MRN: [id number]     Patient Name: Connie Brady Date of Encounter: 01/26/2013  Primary Care Provider:  Astrid Divine, MD Primary Cardiologist:  Tobias Alexander, H   Problem List   Past Medical History  Diagnosis Date  . Anemia   . Polymyositis   . Sjoegren syndrome    Past Surgical History  Procedure Laterality Date  . Knee surgery      right  . Muscle biopsy      r/thigh    Allergies  Allergies  Allergen Reactions  . Percocet [Oxycodone-Acetaminophen] Rash and Other (See Comments)    Hallucinations (does not know if it is hydrocodone or apap)    HPI  42 year old female with history of polymyositis, Sjogren's syndrome, GERD, who is coming for concerns of dyspnea on exertion and chest pain. First encompassed heart rate couple years ago but are increasing in frequency. She was seen in the ER a year ago ACS was ruled out and it was attributed it to musculoskeletal pain. The patient describes her pain in the upper retrosternal and right chest area, it is not related to exertion, and can last up to hours. Her last episode of chest pain started last night and 10 PM while she was ironing clothes still persistent. She doesn't have any associated symptoms like palpitations or shortness of breath. She works as a Mudlogger and states that lately she hasn't been very active as she gets short of breath with minimal exertion. The patient has been on chronic therapy with immunosuppressants and on prednisone for approximately a year.  Home Medications  Prior to Admission medications   Medication Sig Start Date End Date Taking? Authorizing Provider  calcium carbonate (TUMS - DOSED IN MG ELEMENTAL CALCIUM) 500 MG chewable tablet Chew 1 tablet by mouth daily.   Yes Historical Provider, MD  esomeprazole (NEXIUM) 20 MG capsule Take 20 mg by mouth daily at 12 noon.   Yes Historical Provider, MD  FLUoxetine  (PROZAC) 10 MG capsule Take 10 mg by mouth daily.   Yes Historical Provider, MD  gabapentin (NEURONTIN) 300 MG capsule Take 300 mg by mouth at bedtime.  11/19/11  Yes Historical Provider, MD  Multiple Vitamins-Minerals (MULTIVITAMIN WITH MINERALS) tablet Take 1 tablet by mouth daily.   Yes Historical Provider, MD  pilocarpine (SALAGEN) 5 MG tablet Take 5 mg by mouth as needed.   Yes Historical Provider, MD  predniSONE (DELTASONE) 5 MG tablet Take 20 mg by mouth daily. For 1 week then taper to 30 mg for 1 week   Yes Historical Provider, MD    Family History  Family History  Problem Relation Age of Onset  . Diabetes Mother   . Hypertension Mother   . Diabetes Father   . Hypertension Father     Social History  History   Social History  . Marital Status: Legally Separated    Spouse Name: N/A    Number of Children: N/A  . Years of Education: N/A   Occupational History  . Not on file.   Social History Main Topics  . Smoking status: Never Smoker   . Smokeless tobacco: Not on file  . Alcohol Use: No  . Drug Use: No  . Sexual Activity: Not on file   Other Topics Concern  . Not on file   Social History Narrative  . No narrative on file     Review of  Systems, as per HPI, otherwise negative General:  No chills, fever, night sweats or weight changes.  Cardiovascular:  No chest pain, dyspnea on exertion, edema, orthopnea, palpitations, paroxysmal nocturnal dyspnea. Dermatological: No rash, lesions/masses Respiratory: No cough, dyspnea Urologic: No hematuria, dysuria Abdominal:   No nausea, vomiting, diarrhea, bright red blood per rectum, melena, or hematemesis Neurologic:  No visual changes, wkns, changes in mental status. All other systems reviewed and are otherwise negative except as noted above.  Physical Exam  Blood pressure 128/64, pulse 72, height 5\' 8"  (1.727 m), weight 220 lb (99.791 kg).  General: Pleasant, NAD Psych: Normal affect. Neuro: Alert and oriented X 3.  Moves all extremities spontaneously. HEENT: Normal  Neck: Supple without bruits or JVD. Lungs:  Resp regular and unlabored, CTA. Heart: RRR no s3, s4, or murmurs. Abdomen: Soft, non-tender, non-distended, BS + x 4.  Extremities: No clubbing, cyanosis or edema. DP/PT/Radials 2+ and equal bilaterally.  Labs:  No results found for this basename: CKTOTAL, CKMB, TROPONINI,  in the last 72 hours No results found for this basename: WBC, HGB, HCT, MCV, PLT   No results found for this basename: NA, K, CL, CO2, BUN, CREATININE, CALCIUM, LABALBU, PROT, BILITOT, ALKPHOS, ALT, AST, GLUCOSE,  in the last 168 hours No results found for this basename: CHOL, HDL, LDLCALC, TRIG   No results found for this basename: DDIMER   No components found with this basename: POCBNP,   Accessory Clinical Findings  echocardiogram  ECG - sinus rhythm, 60 beats per minutes, normal EKG   Assessment & Plan  42 year old female  1. chest pain, it is rather atypical however she also has significant dyspnea on exertion and had streaks factors that include chronic steroid therapy, autoimmune diseases, and obesity. A coronary CT to evaluate for degree of atherosclerotic burden and her prognosis.  2. blood pressure - well controlled despite prednisone therapy we'll monitor her.  3. lipid profile - followed by her primary care physician the last values from October 2014 showed triglycerides 137, HDL 47, LDL 84, HDL should be higher, ideally by increasing exercise level, we will encourage once she we rule out significant stenosis.  Coronary CT the next week, followed by office visit. Meanwhile give sl NTG 0.4 mg PRN.    Tobias Alexander, Rexene Edison, MD, [facility] 01/26/2013, 11:22 AM

## 2013-01-31 ENCOUNTER — Ambulatory Visit (HOSPITAL_COMMUNITY): Payer: BC Managed Care – PPO

## 2013-01-31 NOTE — Progress Notes (Signed)
Patient ID: Connie Brady, female   DOB: [birth year], 41 y.o.   MRN: [id number]  42 year old female with several risk factors for CAD including hyperlipidemia and most concerning is h/o autoimmune disease that are known to cause premature atherosclerosis. In addition she is on chronic steroid therapy. I am almost certain that this patient has some degree of CAD. The question is how severe it is and how aggressively we have to approach it in the future. This study is not only for the diagnosis of flow limiting stenosis but also for risk stratification that will help [place] to guide her further therapy.  Thank you,  Tobias Alexander, H 01/31/2013   1. chest pain, it is rather atypical however she also has significant dyspnea on exertion and had streaks factors that include chronic steroid therapy, autoimmune diseases, and obesity. A coronary CT to evaluate for degree of atherosclerotic burden and her prognosis.  2. blood pressure - well controlled despite prednisone therapy we'll monitor her.  3. lipid profile - followed by her primary care physician the last values from October 2014 showed triglycerides 137, HDL 47, LDL 84, HDL should be higher, ideally by increasing exercise level, we will encourage once she we rule out significant stenosis.  Coronary CT the next week, followed by office visit. Meanwhile give sl NTG 0.4 mg PRN.

## 2013-02-02 ENCOUNTER — Ambulatory Visit: Payer: BC Managed Care – PPO | Admitting: Cardiology

## 2013-02-14 ENCOUNTER — Encounter: Payer: Self-pay | Admitting: Cardiology

## 2013-02-15 ENCOUNTER — Telehealth: Payer: Self-pay

## 2013-02-15 DIAGNOSIS — R079 Chest pain, unspecified: Secondary | ICD-10-CM

## 2013-02-15 NOTE — Telephone Encounter (Signed)
Patient called received message from Dr.Nelson to schedule exercise myoview.Schedulers will call back to schedule.

## 2013-03-13 ENCOUNTER — Encounter (HOSPITAL_COMMUNITY): Payer: BC Managed Care – PPO

## 2013-03-14 ENCOUNTER — Other Ambulatory Visit (HOSPITAL_COMMUNITY): Payer: BC Managed Care – PPO

## 2013-03-21 ENCOUNTER — Other Ambulatory Visit (HOSPITAL_COMMUNITY): Payer: BC Managed Care – PPO

## 2013-03-22 ENCOUNTER — Telehealth: Payer: Self-pay

## 2013-03-22 DIAGNOSIS — R079 Chest pain, unspecified: Secondary | ICD-10-CM

## 2013-03-22 NOTE — Telephone Encounter (Signed)
**Note De-Identified  Obfuscation** The pt is advised that her insurance will not cover a Nuclear Myoview so Per Dr Meda Coffee this test will be changed to a GXT. The pt verbalized understanding and is in agreement. She is aware that someone from this office will call her to schedule the GXT, she verbalized understanding.

## 2013-04-04 ENCOUNTER — Encounter: Payer: BC Managed Care – PPO | Admitting: Cardiology

## 2013-04-11 ENCOUNTER — Other Ambulatory Visit (HOSPITAL_COMMUNITY): Payer: BC Managed Care – PPO

## 2013-04-14 ENCOUNTER — Ambulatory Visit (INDEPENDENT_AMBULATORY_CARE_PROVIDER_SITE_OTHER): Payer: BC Managed Care – PPO | Admitting: Physician Assistant

## 2013-04-14 DIAGNOSIS — R079 Chest pain, unspecified: Secondary | ICD-10-CM

## 2013-04-14 NOTE — Progress Notes (Signed)
Exercise Treadmill Test  Pre-Exercise Testing Evaluation Rhythm: normal sinus  Rate: 79     Test  Exercise Tolerance Test Ordering MD: Ena Dawley, MD  Interpreting MD: Richardson Dopp, PA-C  Unique Test No: 1  Treadmill:  1  Indication for ETT: chest pain - rule out ischemia  Contraindication to ETT: No   Stress Modality: exercise - treadmill  Cardiac Imaging Performed: non   Protocol: standard Bruce - maximal  Max BP:  172/64  Max MPHR (bpm): 176 85% MPR (bpm):  150  MPHR obtained (bpm):  160 % MPHR obtained:  91  Reached 85% MPHR (min:sec):  4:11 Total Exercise Time (min-sec):  7:00  Workload in METS:  8.4 Borg Scale: 17  Reason ETT Terminated:  desired heart rate attained    ST Segment Analysis At Rest: normal ST segments - no evidence of significant ST depression With Exercise: non-specific ST changes  Other Information Arrhythmia:  No Angina during ETT:  absent (0) Quality of ETT:  diagnostic  ETT Interpretation:  normal - no evidence of ischemia by ST analysis  Comments: Good exercise capacity. No chest pain. Normal BP response to exercise. No significant ST changes to suggest ischemia.   Recommendations: F/u with Dr. Ena Dawley as directed. Signed,  Richardson Dopp, PA-C   04/14/2013 10:45 AM

## 2013-04-26 ENCOUNTER — Other Ambulatory Visit (HOSPITAL_COMMUNITY): Payer: BC Managed Care – PPO

## 2013-05-03 ENCOUNTER — Ambulatory Visit (INDEPENDENT_AMBULATORY_CARE_PROVIDER_SITE_OTHER): Payer: BC Managed Care – PPO

## 2013-05-03 ENCOUNTER — Encounter: Payer: Self-pay | Admitting: Podiatry

## 2013-05-03 ENCOUNTER — Ambulatory Visit (INDEPENDENT_AMBULATORY_CARE_PROVIDER_SITE_OTHER): Payer: BC Managed Care – PPO | Admitting: Podiatry

## 2013-05-03 VITALS — BP 110/72 | HR 94 | Resp 16

## 2013-05-03 DIAGNOSIS — M79609 Pain in unspecified limb: Secondary | ICD-10-CM

## 2013-05-03 DIAGNOSIS — M7750 Other enthesopathy of unspecified foot: Secondary | ICD-10-CM

## 2013-05-03 DIAGNOSIS — M79673 Pain in unspecified foot: Secondary | ICD-10-CM

## 2013-05-04 NOTE — Progress Notes (Signed)
Subjective:     Patient ID: Connie Brady, female   DOB: 15-Jun-1970, 44 y.o.   MRN: [id number]  HPI patient presents stating that her left foot is bothering her no longer the heel but the top and side of the foot has become tender over the last month   Review of Systems     Objective:   Physical Exam Neurovascular status intact with normal muscle strength range of motion with discomfort of the dorsal and lateral aspect of the left foot with no appearance trigger point. Heel is mildly tender but improved    Assessment:     Appears to be tendinitis of the left foot with inflammation noted    Plan:     H&P and x-ray reviewed. Placed in air fracture walker to immobilize and advised on ice therapy and anti-inflammatories. Reappoint her recheck in 4 weeks

## 2013-05-09 ENCOUNTER — Other Ambulatory Visit (HOSPITAL_COMMUNITY): Payer: Self-pay | Admitting: Cardiology

## 2013-05-09 ENCOUNTER — Other Ambulatory Visit: Payer: Self-pay

## 2013-05-09 ENCOUNTER — Ambulatory Visit (HOSPITAL_COMMUNITY): Payer: BC Managed Care – PPO | Attending: Cardiology | Admitting: Cardiology

## 2013-05-09 DIAGNOSIS — R079 Chest pain, unspecified: Secondary | ICD-10-CM | POA: Insufficient documentation

## 2013-05-09 NOTE — Progress Notes (Signed)
Echo performed. 

## 2013-05-12 NOTE — Progress Notes (Signed)
Quick Note:  Pt notified. Recall in system for 1 yr as pt would like to continue follow up. ______

## 2013-08-10 ENCOUNTER — Other Ambulatory Visit (HOSPITAL_COMMUNITY): Payer: Self-pay | Admitting: Rheumatology

## 2013-08-10 DIAGNOSIS — M25569 Pain in unspecified knee: Secondary | ICD-10-CM

## 2013-08-11 ENCOUNTER — Ambulatory Visit (HOSPITAL_COMMUNITY)
Admission: RE | Admit: 2013-08-11 | Discharge: 2013-08-11 | Disposition: A | Discharge status: Home / Self Care | Payer: BC Managed Care – PPO | Source: Ambulatory Visit | Attending: Interventional Radiology | Admitting: Interventional Radiology

## 2013-08-11 DIAGNOSIS — M25569 Pain in unspecified knee: Secondary | ICD-10-CM

## 2013-08-11 DIAGNOSIS — M79609 Pain in unspecified limb: Secondary | ICD-10-CM

## 2013-08-11 DIAGNOSIS — R079 Chest pain, unspecified: Secondary | ICD-10-CM

## 2013-08-11 NOTE — Progress Notes (Signed)
Bilateral lower extremity venous duplex:  No evidence of DVT, superficial thrombosis, or Baker's Cyst.   

## 2015-10-31 ENCOUNTER — Telehealth: Payer: Self-pay | Admitting: Cardiology

## 2015-10-31 NOTE — Telephone Encounter (Signed)
Received call transferred directly from operator and spoke with pt. Pt reports she is currently having chest pain.  Pt was last seen here for GXT in 2/15 and last office visit was with Dr. Meda Coffee in 12/14.  I instructed pt she should go to ED as she was currently experiencing chest pain.

## 2015-10-31 NOTE — Telephone Encounter (Signed)
Pt c/o of Chest Pain: 1. Are you having CP right now? Yes   2. Are you experiencing any other symptoms (ex. SOB, nausea, vomiting, sweating)? no 3. How long have you been experiencing CP?2wks 4. Is your CP continuous or coming and going?continuous 5. Have you taken Nitroglycerin? no

## 2015-12-09 ENCOUNTER — Other Ambulatory Visit: Payer: Self-pay | Admitting: Radiology

## 2015-12-09 MED ORDER — PILOCARPINE HCL 5 MG PO TABS: 5.0000 mg | ORAL_TABLET | Freq: Three times a day (TID) | ORAL | 5 refills | Status: DC | PRN

## 2015-12-09 NOTE — Telephone Encounter (Signed)
Last visit 08/13/15 Next visit 01/13/16 Ok to refill Pilocarpine per SD

## 2016-01-10 DIAGNOSIS — M170 Bilateral primary osteoarthritis of knee: Secondary | ICD-10-CM | POA: Insufficient documentation

## 2016-01-10 DIAGNOSIS — M722 Plantar fascial fibromatosis: Secondary | ICD-10-CM | POA: Insufficient documentation

## 2016-01-10 DIAGNOSIS — Z79899 Other long term (current) drug therapy: Secondary | ICD-10-CM | POA: Insufficient documentation

## 2016-01-10 DIAGNOSIS — M19072 Primary osteoarthritis, left ankle and foot: Secondary | ICD-10-CM | POA: Insufficient documentation

## 2016-01-10 DIAGNOSIS — M19071 Primary osteoarthritis, right ankle and foot: Secondary | ICD-10-CM | POA: Insufficient documentation

## 2016-01-10 DIAGNOSIS — M160 Bilateral primary osteoarthritis of hip: Secondary | ICD-10-CM | POA: Insufficient documentation

## 2016-01-10 NOTE — Progress Notes (Signed)
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 female with history of Sjogren's and polymyositis. She states she started having pain in her right hamstring and right trochanteric area in the last 1 month. She was seen by her PCP and was referred to a physical therapist. She states she has sneezing and some stretching in that area. The right trochanteric pain improved with the hamstring pain persist. Recently the trochanteric pain has recurred also. She does have chronic lower back pain and thoracic pain. She states she went to [facility] where she had x-rays and was told that they were normal and was given prescription for Lidoderm patches which she's been using. She continues to have sicca symptoms.  Activities of Daily Living:  Patient reports morning stiffness for 30 minutes.   Patient Reports nocturnal pain.  Difficulty dressing/grooming: Denies Difficulty climbing stairs: Denies Difficulty getting out of chair: Denies Difficulty using hands for taps, buttons, cutlery, and/or writing: Denies   Review of Systems  Constitutional: Positive for fatigue. Negative for night sweats, weight gain, weight loss and weakness.  HENT: Positive for mouth dryness. Negative for mouth sores, trouble swallowing, trouble swallowing and nose dryness.   Eyes: Positive for dryness. Negative for pain, redness and visual disturbance.  Respiratory: Negative for cough, shortness of breath and difficulty breathing.   Cardiovascular: Negative for chest pain, palpitations, hypertension, irregular heartbeat and swelling in legs/feet.  Gastrointestinal: Negative for blood in stool, constipation and  diarrhea.  Endocrine: Negative for increased urination.  Genitourinary: Negative for vaginal dryness.  Musculoskeletal: Positive for arthralgias, joint pain, morning stiffness and muscle tenderness. Negative for joint swelling, myalgias, muscle weakness and myalgias.  Skin: Negative for color change, rash, hair loss, skin tightness, ulcers and sensitivity to sunlight.  Allergic/Immunologic: Negative for susceptible to infections.  Neurological: Negative for dizziness, memory loss and night sweats.  Hematological: Negative for swollen glands.  Psychiatric/Behavioral: Negative for depressed mood and sleep disturbance. The patient is not nervous/anxious.     PMFS History:  Patient Active Problem List   Diagnosis Date Noted  . High risk medication use 01/10/2016  . Primary osteoarthritis of both hips 01/10/2016  . Primary osteoarthritis of both knees 01/10/2016  . Primary osteoarthritis of both feet 01/10/2016  . Bilateral plantar fasciitis 01/10/2016  . Chest pain 01/26/2013  . Sjogren's syndrome ([place]) 01/26/2013  . Polymyositis ([place]) 01/26/2013  . Sjoegren syndrome [facility])     Past Medical History:  Diagnosis Date  . Anemia   . Polymyositis ([place])   . Sjoegren syndrome ([place])     Family History  Problem Relation Age of Onset  . Hypertension Sister   . Hypertension Brother   . Diabetes Brother   . Hypertension Sister   . Hypertension Sister   . Hypertension Brother    Past Surgical History:  Procedure Laterality Date  . BUNIONECTOMY Bilateral   . KNEE SURGERY     right  . MUSCLE BIOPSY     r/thigh  . PLANTAR FASCIA SURGERY Right    Social History   Social  History Narrative  . No narrative on file     Objective: Vital Signs: BP (!) 103/58 (BP Location: Right Arm, Patient Position: Sitting, Cuff Size: Large)   Pulse (!) 103   Resp 12   Ht 5\' 8"  (1.727 m)   Wt 209 lb (94.8 kg)   LMP 12/23/2015   BMI 31.78 kg/m    Physical Exam  Constitutional: She is oriented  to person, place, and time. She appears well-developed and well-nourished.  HENT:  Head: Normocephalic and atraumatic.  Eyes: Conjunctivae and EOM are normal.  Neck: Normal range of motion.  Cardiovascular: Normal rate, regular rhythm, normal heart sounds and intact distal pulses.   Pulmonary/Chest: Effort normal and breath sounds normal.  Abdominal: Soft. Bowel sounds are normal.  Lymphadenopathy:    She has no cervical adenopathy.  Neurological: She is alert and oriented to person, place, and time.  Skin: Skin is warm and dry. Capillary refill takes less than 2 seconds.  Psychiatric: She has a normal mood and affect. Her behavior is normal.  Nursing note and vitals reviewed.    Musculoskeletal Exam: C-spine, thoracic spine, lumbar spine good range of motion. She has good range of motion of her shoulders elbows wrists joint MCPs PIPs DIPs with no synovitis.She has limited range of motion in her hip joints She has good range of motion of her joints knee joints ankles MTPs PIPs with no swelling. She has tenderness over right piriformis area and also over the right hamstring area she has mild tenderness over right trochanteric bursa area.  CDAI Exam: No CDAI exam completed.    Investigation: Findings:  December 2013 PPD negative, pneumococcal vaccine 2014, 11/25/2015 CMP creatinine 1.33 GFR 55    Imaging: No results found.  Speciality Comments: No specialty comments available.    Procedures:  Trigger Point Inj Date/Time: 01/14/2016 11:18 AM Performed by: Bo Merino Authorized by: Bo Merino   Consent Given by:  Patient Site marked: the procedure site was marked   Timeout: prior to procedure the correct patient, procedure, and site was verified   Indications:  Muscle spasm Total # of Trigger Points:  1 Location: hip   Needle Size:  27 G Approach:  Dorsal Medications #1:  1 mL lidocaine 1 %; 40 mg triamcinolone acetonide 40 MG/ML Patient tolerance:  Patient  tolerated the procedure well with no immediate complications   Allergies: Lyrica [pregabalin] and Percocet [oxycodone-acetaminophen]   Assessment / Plan:     Visit Diagnoses: Polymyositis ([place]) - Elevated CK, normal aldolase, normal myositis panel. Muscle biopsy October 2013 consistent with polymyositis, Initial CK January 2012 777 -she does not appear to have any muscle weakness or tenderness on examination. Her last CK in June was within normal limits. She is on Imuran and prednisone and we will continue to monitor her labs and CK. Plan: CK in December and every 3 months.  Sjogren's syndrome - ANA 1:40 centromere, positive Ro, on pilocarpine. She continues to have sicca symptoms for which over-the-counter products were discussed.  High risk medication use - Imuran 50 mg daily, prednisone 7 mg daily - Plan: CBC with Differential/Platelet, COMPLETE METABOLIC PANEL WITH GFR in December and every 3 months.    Low back pain - she appears to have some leg length discrepancy. She has tenderness over her right piriformis area and right trochanteric area. After different treatment options were discussed as she is not responding to physical therapy I decided to give her a trigger point injection in the right piriformis  area. We'll see response to that. If she continues to have problems have advised her to see her orthopedic surgeon. Plan: XR Lumbar Spine 2-3 Views, XR Pelvis 1-2 Views today were unremarkable except for mild facet joint arthropathy and mild osteoarthritis in the hip joints.  Primary osteoarthritis of both hips - Bilateral mild with some limitation of motion  Primary osteoarthritis of both knees - A status post right knee joint arthroscopic surgery Dr. Theda Sers  Primary osteoarthritis of both feet - Bilateral bunionectomy: Doing well  Bilateral plantar fasciitis - Is status post right plantar fascial release June 2016 Dr. Su Hoff: No recent flare  Chronic kidney disease - Followed up by  Dr. Moshe Cipro Baseline creatinine 1.12    Orders: Orders Placed This Encounter  Procedures  . Trigger Point Injection  . XR Lumbar Spine 2-3 Views  . XR Pelvis 1-2 Views  . CBC with Differential/Platelet  . COMPLETE METABOLIC PANEL WITH GFR  . CK   No orders of the defined types were placed in this encounter.   Face-to-face time spent with patient was 68minutes. 50% of time was spent in counseling and coordination of care.  Follow-Up Instructions: Return in about 4 months (around 05/13/2016) for Polymyositis.   Bo Merino, MD

## 2016-01-14 ENCOUNTER — Ambulatory Visit (INDEPENDENT_AMBULATORY_CARE_PROVIDER_SITE_OTHER): Payer: Self-pay

## 2016-01-14 ENCOUNTER — Encounter: Payer: Self-pay | Admitting: Rheumatology

## 2016-01-14 ENCOUNTER — Ambulatory Visit (INDEPENDENT_AMBULATORY_CARE_PROVIDER_SITE_OTHER): Payer: BC Managed Care – PPO | Admitting: Rheumatology

## 2016-01-14 VITALS — BP 103/58 | HR 103 | Resp 12 | Ht 68.0 in | Wt 209.0 lb

## 2016-01-14 DIAGNOSIS — M3501 Sicca syndrome with keratoconjunctivitis: Secondary | ICD-10-CM | POA: Diagnosis not present

## 2016-01-14 DIAGNOSIS — N189 Chronic kidney disease, unspecified: Secondary | ICD-10-CM

## 2016-01-14 DIAGNOSIS — M19071 Primary osteoarthritis, right ankle and foot: Secondary | ICD-10-CM | POA: Diagnosis not present

## 2016-01-14 DIAGNOSIS — M722 Plantar fascial fibromatosis: Secondary | ICD-10-CM

## 2016-01-14 DIAGNOSIS — G8929 Other chronic pain: Secondary | ICD-10-CM

## 2016-01-14 DIAGNOSIS — M19072 Primary osteoarthritis, left ankle and foot: Secondary | ICD-10-CM | POA: Diagnosis not present

## 2016-01-14 DIAGNOSIS — M545 Low back pain: Secondary | ICD-10-CM | POA: Diagnosis not present

## 2016-01-14 DIAGNOSIS — M170 Bilateral primary osteoarthritis of knee: Secondary | ICD-10-CM | POA: Diagnosis not present

## 2016-01-14 DIAGNOSIS — Z79899 Other long term (current) drug therapy: Secondary | ICD-10-CM

## 2016-01-14 DIAGNOSIS — M160 Bilateral primary osteoarthritis of hip: Secondary | ICD-10-CM

## 2016-01-14 DIAGNOSIS — M3320 Polymyositis, organ involvement unspecified: Secondary | ICD-10-CM | POA: Diagnosis not present

## 2016-01-14 LAB — CK: CK TOTAL: 462 U/L — AB (ref 7–177)

## 2016-01-14 MED ORDER — TRIAMCINOLONE ACETONIDE 40 MG/ML IJ SUSP
40.0000 mg | INTRAMUSCULAR | Status: AC | PRN
  Administered 2016-01-14: 40 mg via INTRAMUSCULAR

## 2016-01-14 MED ORDER — LIDOCAINE HCL 1 % IJ SOLN
1.0000 mL | INTRAMUSCULAR | Status: AC | PRN
  Administered 2016-01-14: 1 mL

## 2016-01-14 NOTE — Patient Instructions (Signed)
Standing Labs We placed an order today for your standing lab work.    Please come back and get your standing labs in December and every 3 months which include CBC, CMP with GFR and CK  We have open lab Monday through Friday from 8:30-11:30 AM and 1-4 PM at the office of Dr. Tresa Moore, PA.   The office is located at [address] [place], [place], [place] [address] No appointment is necessary.   Labs are drawn by [facility].  You may receive a bill from [facility] for your lab work.     If right hip pain and lower back pain symptoms persist please follow-up with the orthopedic surgeon

## 2016-01-16 NOTE — Progress Notes (Signed)
She had a cortisone injection the same day. Her CKs is higher than usual. If her symptoms do not improve we should repeat her CK and aldolase in one month.

## 2016-01-17 ENCOUNTER — Telehealth: Payer: Self-pay | Admitting: Radiology

## 2016-01-17 DIAGNOSIS — R748 Abnormal levels of other serum enzymes: Secondary | ICD-10-CM

## 2016-01-17 NOTE — Telephone Encounter (Signed)
Thank you I have discussed with her, and she will return for labs in one month.

## 2016-01-17 NOTE — Telephone Encounter (Signed)
-----   Message from Bo Merino, MD sent at 01/16/2016  4:18 PM EST ----- She had a cortisone injection the same day. Her CKs is higher than usual. If her symptoms do not improve we should repeat her CK and aldolase in one month.

## 2016-01-27 ENCOUNTER — Telehealth: Payer: Self-pay | Admitting: Rheumatology

## 2016-01-27 NOTE — Telephone Encounter (Signed)
Patient would like to speak to someone about the right leg pain she is having and she is also requesting that an MRI be ordered for her right leg

## 2016-01-27 NOTE — Telephone Encounter (Signed)
Attempted to contact the patient and left message for patient to call the office.  

## 2016-01-31 NOTE — Telephone Encounter (Signed)
Patient advised of recommendations. Patient advised to increase Prednisone to 15 mg per day for 1 month and then to come in for repeat blood work. Patient verbalized understanding.

## 2016-01-31 NOTE — Telephone Encounter (Signed)
Her CK was elevated lately. We will increase her prednisone to 15 mg a day for 1 month then repeat CBC, CMP and CK and sedimentation rate as to that

## 2016-01-31 NOTE — Telephone Encounter (Signed)
Patient is having trouble with her hamstring and she is receiving PT. Patient states she is also having knee pain. Patient wants to know how she can tell if the polymyositis is getting worse or not.

## 2016-02-06 ENCOUNTER — Other Ambulatory Visit: Payer: Self-pay | Admitting: Rheumatology

## 2016-02-07 NOTE — Telephone Encounter (Signed)
ok 

## 2016-02-07 NOTE — Telephone Encounter (Signed)
Last Visit: 01/14/16 Next Visit: 05/13/16 Labs: 11/26/15 decreased kidney function  Okay to refill Imuran?

## 2016-02-17 HISTORY — PX: KNEE ARTHROPLASTY: SHX992

## 2016-02-24 ENCOUNTER — Telehealth: Payer: Self-pay | Admitting: Rheumatology

## 2016-02-24 NOTE — Telephone Encounter (Signed)
Patient is requesting refill of prednisone but would like the dosage decreased back to 7mg . Patient uses [facility] on [address]. Patient says if she doesn't answer to please leave a detailed message.

## 2016-02-24 NOTE — Telephone Encounter (Signed)
Attempted to contact the patient and left message for patient to call the office.  

## 2016-02-25 MED ORDER — PREDNISONE 1 MG PO TABS: 2.0000 mg | ORAL_TABLET | Freq: Every day | ORAL | 1 refills | Status: DC

## 2016-02-25 MED ORDER — PREDNISONE 5 MG PO TABS: 5.0000 mg | ORAL_TABLET | Freq: Every day | ORAL | 1 refills | Status: DC

## 2016-02-25 NOTE — Telephone Encounter (Addendum)
Last Visit: 01/14/16 Next visit: 05/13/16  Okay to refill Prednisone?

## 2016-02-25 NOTE — Telephone Encounter (Signed)
Left message to advise prescription has been sent to the pharmacy.

## 2016-02-25 NOTE — Telephone Encounter (Signed)
Okay 

## 2016-03-13 ENCOUNTER — Other Ambulatory Visit: Payer: Self-pay | Admitting: *Deleted

## 2016-03-13 DIAGNOSIS — Z79899 Other long term (current) drug therapy: Secondary | ICD-10-CM

## 2016-03-13 DIAGNOSIS — R748 Abnormal levels of other serum enzymes: Secondary | ICD-10-CM

## 2016-03-13 LAB — CBC WITH DIFFERENTIAL/PLATELET
BASOS ABS: 0 {cells}/uL (ref 0–200)
Basophils Relative: 0 %
Eosinophils Absolute: 0 cells/uL — ABNORMAL LOW (ref 15–500)
Eosinophils Relative: 0 %
HEMATOCRIT: 40.4 % (ref 35.0–45.0)
HEMOGLOBIN: 13.4 g/dL (ref 11.7–15.5)
LYMPHS PCT: 14 %
Lymphs Abs: 812 cells/uL — ABNORMAL LOW (ref 850–3900)
MCH: 28.3 pg (ref 27.0–33.0)
MCHC: 33.2 g/dL (ref 32.0–36.0)
MCV: 85.4 fL (ref 80.0–100.0)
MONO ABS: 290 {cells}/uL (ref 200–950)
MPV: 9.6 fL (ref 7.5–12.5)
Monocytes Relative: 5 %
NEUTROS PCT: 81 %
Neutro Abs: 4698 cells/uL (ref 1500–7800)
Platelets: 222 10*3/uL (ref 140–400)
RBC: 4.73 MIL/uL (ref 3.80–5.10)
RDW: 14 % (ref 11.0–15.0)
WBC: 5.8 10*3/uL (ref 3.8–10.8)

## 2016-03-14 LAB — COMPLETE METABOLIC PANEL WITH GFR
ALBUMIN: 3.8 g/dL (ref 3.6–5.1)
ALK PHOS: 53 U/L (ref 33–115)
ALT: 9 U/L (ref 6–29)
AST: 13 U/L (ref 10–35)
BUN: 14 mg/dL (ref 7–25)
CALCIUM: 9.2 mg/dL (ref 8.6–10.2)
CHLORIDE: 107 mmol/L (ref 98–110)
CO2: 24 mmol/L (ref 20–31)
Creat: 1.36 mg/dL — ABNORMAL HIGH (ref 0.50–1.10)
GFR, EST AFRICAN AMERICAN: 53 mL/min — AB (ref >=60)
GFR, EST NON AFRICAN AMERICAN: 46 mL/min — AB (ref >=60)
Glucose, Bld: 112 mg/dL — ABNORMAL HIGH (ref 65–99)
POTASSIUM: 4 mmol/L (ref 3.5–5.3)
Sodium: 142 mmol/L (ref 135–146)
Total Bilirubin: 0.4 mg/dL (ref 0.2–1.2)
Total Protein: 6.7 g/dL (ref 6.1–8.1)

## 2016-03-14 LAB — CK: CK TOTAL: 75 U/L (ref 7–177)

## 2016-03-16 LAB — ALDOLASE: Aldolase: 2.8 U/L (ref <=8.1)

## 2016-03-17 ENCOUNTER — Telehealth: Payer: Self-pay | Admitting: Radiology

## 2016-03-17 NOTE — Progress Notes (Signed)
Please compare to prior creatinine values

## 2016-03-17 NOTE — Telephone Encounter (Signed)
-----   Message from Bo Merino, MD sent at 03/17/2016  1:22 PM EST ----- Please compare to prior creatinine values

## 2016-03-30 ENCOUNTER — Telehealth: Payer: Self-pay | Admitting: *Deleted

## 2016-03-30 DIAGNOSIS — N183 Chronic kidney disease, stage 3 unspecified: Secondary | ICD-10-CM | POA: Insufficient documentation

## 2016-03-30 NOTE — Telephone Encounter (Signed)
Patient was seen by [facility]. Patient had CKD stage 2 now it's CKD stage 3. Patient to follow up in 6 months.

## 2016-04-23 ENCOUNTER — Other Ambulatory Visit: Payer: Self-pay | Admitting: *Deleted

## 2016-04-23 DIAGNOSIS — Z79899 Other long term (current) drug therapy: Secondary | ICD-10-CM

## 2016-04-23 LAB — CBC WITH DIFFERENTIAL/PLATELET
BASOS PCT: 0 %
Basophils Absolute: 0 cells/uL (ref 0–200)
EOS ABS: 0 {cells}/uL — AB (ref 15–500)
EOS PCT: 0 %
HCT: 38.6 % (ref 35.0–45.0)
Hemoglobin: 12.5 g/dL (ref 11.7–15.5)
LYMPHS PCT: 25 %
Lymphs Abs: 2025 cells/uL (ref 850–3900)
MCH: 27.7 pg (ref 27.0–33.0)
MCHC: 32.4 g/dL (ref 32.0–36.0)
MCV: 85.4 fL (ref 80.0–100.0)
MONO ABS: 729 {cells}/uL (ref 200–950)
MPV: 9.4 fL (ref 7.5–12.5)
Monocytes Relative: 9 %
Neutro Abs: 5346 cells/uL (ref 1500–7800)
Neutrophils Relative %: 66 %
PLATELETS: 231 10*3/uL (ref 140–400)
RBC: 4.52 MIL/uL (ref 3.80–5.10)
RDW: 13.8 % (ref 11.0–15.0)
WBC: 8.1 10*3/uL (ref 3.8–10.8)

## 2016-04-24 LAB — COMPLETE METABOLIC PANEL WITH GFR
ALT: 10 U/L (ref 6–29)
AST: 16 U/L (ref 10–35)
Albumin: 3.6 g/dL (ref 3.6–5.1)
Alkaline Phosphatase: 51 U/L (ref 33–115)
BUN: 12 mg/dL (ref 7–25)
CO2: 25 mmol/L (ref 20–31)
Calcium: 8.9 mg/dL (ref 8.6–10.2)
Chloride: 103 mmol/L (ref 98–110)
Creat: 1.37 mg/dL — ABNORMAL HIGH (ref 0.50–1.10)
GFR, Est African American: 53 mL/min — ABNORMAL LOW (ref >=60)
GFR, Est Non African American: 46 mL/min — ABNORMAL LOW (ref >=60)
GLUCOSE: 54 mg/dL — AB (ref 65–99)
POTASSIUM: 5.1 mmol/L (ref 3.5–5.3)
SODIUM: 138 mmol/L (ref 135–146)
TOTAL PROTEIN: 6.3 g/dL (ref 6.1–8.1)
Total Bilirubin: 0.5 mg/dL (ref 0.2–1.2)

## 2016-04-24 NOTE — Progress Notes (Signed)
Labs stable

## 2016-05-07 DIAGNOSIS — Z87448 Personal history of other diseases of urinary system: Secondary | ICD-10-CM | POA: Insufficient documentation

## 2016-05-07 NOTE — Progress Notes (Signed)
Office Visit Note  Patient: Connie Brady             Date of Birth: August 30, 1970           MRN: [id number]             PCP: Osborne Casco, MD Referring: Kelton Pillar, MD Visit Date: 05/19/2016 Occupation: '@GUAROCC' @    Subjective:  Right hip pain   History of Present Illness: Connie Brady is a 46 y.o. female with history of polymyositis and Sjogren's. She states she's been having pain and discomfort in the right trochanteric area. She has been going to physical therapy and had dry needling without much results. She feels some tightness in her calves bilaterally area . She's experiencing muscle weakness in the right lower extremity. She feels that her bilateral lower extremities are swollen. She continues to have pain and discomfort in her bilateral knee joints and feet. She continues to have discomfort in plantar fascia.  Activities of Daily Living:  Patient reports morning stiffness for 1 hour.   Patient Denies nocturnal pain.  Difficulty dressing/grooming: Denies Difficulty climbing stairs: Reports Difficulty getting out of chair: Reports Difficulty using hands for taps, buttons, cutlery, and/or writing: Denies   Review of Systems  Constitutional: Positive for fatigue. Negative for night sweats, weight gain, weight loss and weakness.  HENT: Negative for mouth sores, trouble swallowing, trouble swallowing, mouth dryness and nose dryness.   Eyes: Negative for pain, redness, visual disturbance and dryness.  Respiratory: Negative for cough, shortness of breath and difficulty breathing.   Cardiovascular: Negative for chest pain, palpitations, hypertension, irregular heartbeat and swelling in legs/feet.  Gastrointestinal: Negative for blood in stool, constipation and diarrhea.  Endocrine: Negative for increased urination.  Genitourinary: Negative for vaginal dryness.  Musculoskeletal: Positive for arthralgias, joint pain, myalgias, muscle weakness and myalgias.  Negative for joint swelling, morning stiffness and muscle tenderness.  Skin: Negative for color change, rash, hair loss, skin tightness, ulcers and sensitivity to sunlight.  Allergic/Immunologic: Negative for susceptible to infections.  Neurological: Negative for dizziness, memory loss and night sweats.  Hematological: Negative for swollen glands.  Psychiatric/Behavioral: Positive for depressed mood and sleep disturbance. The patient is nervous/anxious.     PMFS History:  Patient Active Problem List   Diagnosis Date Noted  . History of chronic kidney disease 05/07/2016  . Chronic kidney disease (CKD), stage III (moderate) 03/30/2016  . High risk medication use 01/10/2016  . Primary osteoarthritis of both hips 01/10/2016  . Primary osteoarthritis of both knees 01/10/2016  . Primary osteoarthritis of both feet 01/10/2016  . Bilateral plantar fasciitis 01/10/2016  . Chest pain 01/26/2013  . Polymyositis ([place]) 01/26/2013  . Sjoegren syndrome [facility])     Past Medical History:  Diagnosis Date  . Anemia   . Polymyositis ([place])   . Sjoegren syndrome ([place])     Family History  Problem Relation Age of Onset  . Hypertension Sister   . Hypertension Brother   . Diabetes Brother   . Hypertension Sister   . Hypertension Sister   . Hypertension Brother    Past Surgical History:  Procedure Laterality Date  . BUNIONECTOMY Bilateral   . KNEE SURGERY     right  . MUSCLE BIOPSY     r/thigh  . PLANTAR FASCIA SURGERY Right    Social History   Social History Narrative  . No narrative on file     Objective: Vital Signs: BP 113/74   Pulse (!) 101  Resp 16   Ht '5\' 8"'  (1.727 m)   Wt 193 lb (87.5 kg)   BMI 29.35 kg/m    Physical Exam  Constitutional: She is oriented to person, place, and time. She appears well-developed and well-nourished.  HENT:  Head: Normocephalic and atraumatic.  Eyes: Conjunctivae and EOM are normal.  Neck: Normal range of motion.  Cardiovascular: Normal  rate, regular rhythm, normal heart sounds and intact distal pulses.   Pulmonary/Chest: Effort normal and breath sounds normal.  Abdominal: Soft. Bowel sounds are normal.  Lymphadenopathy:    She has no cervical adenopathy.  Neurological: She is alert and oriented to person, place, and time.  Skin: Skin is warm and dry. Capillary refill takes less than 2 seconds.  Psychiatric: She has a normal mood and affect. Her behavior is normal.  Nursing note and vitals reviewed.    Musculoskeletal Exam: C-spine and thoracic lumbar spine good range of motion. Shoulder joints elbow joints wrist joint MCPs PIPs DIPs with good range of motion with no synovitis hip joints knee joints ankles MTPs PIPs DIPs with good range of motion with no synovitis she has tenderness on palpation over right trochanteric bursa. She also has tenderness across the mid shaft of her right femur. Her muscle strength was 4+ over 5 in all extremities.  CDAI Exam: No CDAI exam completed.    Investigation: No additional findings.  Admission on 05/14/2016, Discharged on 05/14/2016  Component Date Value Ref Range Status  . Total hemoglobin 05/14/2016 11.5* 12.0 - 16.0 g/dL Final  . O2 Saturation 05/14/2016 97.3  % Final  . Carboxyhemoglobin 05/14/2016 0.7  0.5 - 1.5 % Final  . Methemoglobin 05/14/2016 1.0  0.0 - 1.5 % Final  Orders Only on 04/23/2016  Component Date Value Ref Range Status  . WBC 04/23/2016 8.1  3.8 - 10.8 K/uL Final  . RBC 04/23/2016 4.52  3.80 - 5.10 MIL/uL Final  . Hemoglobin 04/23/2016 12.5  11.7 - 15.5 g/dL Final  . HCT 04/23/2016 38.6  35.0 - 45.0 % Final  . MCV 04/23/2016 85.4  80.0 - 100.0 fL Final  . MCH 04/23/2016 27.7  27.0 - 33.0 pg Final  . MCHC 04/23/2016 32.4  32.0 - 36.0 g/dL Final  . RDW 04/23/2016 13.8  11.0 - 15.0 % Final  . Platelets 04/23/2016 231  140 - 400 K/uL Final  . MPV 04/23/2016 9.4  7.5 - 12.5 fL Final  . Neutro Abs 04/23/2016 5346  1,500 - 7,800 cells/uL Final  . Lymphs Abs  04/23/2016 2025  850 - 3,900 cells/uL Final  . Monocytes Absolute 04/23/2016 729  200 - 950 cells/uL Final  . Eosinophils Absolute 04/23/2016 0* 15 - 500 cells/uL Final  . Basophils Absolute 04/23/2016 0  0 - 200 cells/uL Final  . Neutrophils Relative % 04/23/2016 66  % Final  . Lymphocytes Relative 04/23/2016 25  % Final  . Monocytes Relative 04/23/2016 9  % Final  . Eosinophils Relative 04/23/2016 0  % Final  . Basophils Relative 04/23/2016 0  % Final  . Smear Review 04/23/2016 Criteria for review not met   Final  . Sodium 04/23/2016 138  135 - 146 mmol/L Final   Comment: Patient serum had prolonged contact with red cells. Integrity of results may be affected.   . Potassium 04/23/2016 5.1  3.5 - 5.3 mmol/L Final  . Chloride 04/23/2016 103  98 - 110 mmol/L Final  . CO2 04/23/2016 25  20 - 31 mmol/L Final  . Glucose,  Bld 04/23/2016 54* 65 - 99 mg/dL Final  . BUN 04/23/2016 12  7 - 25 mg/dL Final  . Creat 04/23/2016 1.37* 0.50 - 1.10 mg/dL Final  . Total Bilirubin 04/23/2016 0.5  0.2 - 1.2 mg/dL Final  . Alkaline Phosphatase 04/23/2016 51  33 - 115 U/L Final  . AST 04/23/2016 16  10 - 35 U/L Final  . ALT 04/23/2016 10  6 - 29 U/L Final  . Total Protein 04/23/2016 6.3  6.1 - 8.1 g/dL Final  . Albumin 04/23/2016 3.6  3.6 - 5.1 g/dL Final  . Calcium 04/23/2016 8.9  8.6 - 10.2 mg/dL Final  . GFR, Est African American 04/23/2016 53* >=60 mL/min Final  . GFR, Est Non African American 04/23/2016 46* >=60 mL/min Final  Orders Only on 03/13/2016  Component Date Value Ref Range Status  . WBC 03/13/2016 5.8  3.8 - 10.8 K/uL Final  . RBC 03/13/2016 4.73  3.80 - 5.10 MIL/uL Final  . Hemoglobin 03/13/2016 13.4  11.7 - 15.5 g/dL Final  . HCT 03/13/2016 40.4  35.0 - 45.0 % Final  . MCV 03/13/2016 85.4  80.0 - 100.0 fL Final  . MCH 03/13/2016 28.3  27.0 - 33.0 pg Final  . MCHC 03/13/2016 33.2  32.0 - 36.0 g/dL Final  . RDW 03/13/2016 14.0  11.0 - 15.0 % Final  . Platelets 03/13/2016 222  140 -  400 K/uL Final  . MPV 03/13/2016 9.6  7.5 - 12.5 fL Final  . Neutro Abs 03/13/2016 4698  1,500 - 7,800 cells/uL Final  . Lymphs Abs 03/13/2016 812* 850 - 3,900 cells/uL Final  . Monocytes Absolute 03/13/2016 290  200 - 950 cells/uL Final  . Eosinophils Absolute 03/13/2016 0* 15 - 500 cells/uL Final  . Basophils Absolute 03/13/2016 0  0 - 200 cells/uL Final  . Neutrophils Relative % 03/13/2016 81  % Final  . Lymphocytes Relative 03/13/2016 14  % Final  . Monocytes Relative 03/13/2016 5  % Final  . Eosinophils Relative 03/13/2016 0  % Final  . Basophils Relative 03/13/2016 0  % Final  . Smear Review 03/13/2016 Criteria for review not met   Final  . Sodium 03/13/2016 142  135 - 146 mmol/L Final  . Potassium 03/13/2016 4.0  3.5 - 5.3 mmol/L Final  . Chloride 03/13/2016 107  98 - 110 mmol/L Final  . CO2 03/13/2016 24  20 - 31 mmol/L Final  . Glucose, Bld 03/13/2016 112* 65 - 99 mg/dL Final  . BUN 03/13/2016 14  7 - 25 mg/dL Final  . Creat 03/13/2016 1.36* 0.50 - 1.10 mg/dL Final  . Total Bilirubin 03/13/2016 0.4  0.2 - 1.2 mg/dL Final  . Alkaline Phosphatase 03/13/2016 53  33 - 115 U/L Final  . AST 03/13/2016 13  10 - 35 U/L Final  . ALT 03/13/2016 9  6 - 29 U/L Final  . Total Protein 03/13/2016 6.7  6.1 - 8.1 g/dL Final  . Albumin 03/13/2016 3.8  3.6 - 5.1 g/dL Final  . Calcium 03/13/2016 9.2  8.6 - 10.2 mg/dL Final  . GFR, Est African American 03/13/2016 53* >=60 mL/min Final  . GFR, Est Non African American 03/13/2016 46* >=60 mL/min Final  . Total CK 03/13/2016 75  7 - 177 U/L Final  . Aldolase 03/13/2016 2.8  <=8.1 U/L Final    Imaging: Xr Femur, Min 2 Views Right  Result Date: 05/19/2016 2 views of proximal and distal femur were obtained which did not show any  obvious fracture or lesion. There is a possible feeder vessel on the medial aspect of the proximal femur.   Speciality Comments: No specialty comments available.    Procedures:  No procedures performed Allergies:  Lyrica [pregabalin] and Percocet [oxycodone-acetaminophen]   Assessment / Plan:     Visit Diagnoses: Sjogren's syndrome with keratoconjunctivitis sicca (HCC) - ANA 1:40 centromere, on Pilocarpine. Her sicca symptoms are tolerable.  Polymyositis ([place]) - History of elevated CK, positive muscle biopsy, myositis panel negative, initial CK January 2777 -she complains of increased muscle pain and weakness. I'll check her CK level today her CKs have been stable so far. I believe some of her symptoms are coming from trochanteric bursitis and chronic pain. We will obtain following labs today and will refer to physical therapy. Plan: Sedimentation rate, CK, Serum protein electrophoresis with reflex, Ambulatory referral to Physical Therapy  High risk medication use -she is on low-dose Imuran and has not tapered her prednisone yet. I would like to taper her prednisone by 1 mg every 2 months if need be the Imuran dose could be increased. Imuran 50 mg by mouth daily, prednisone 7 mg by mouth daily - Plan: CBC with Differential/Platelet, COMPLETE METABOLIC PANEL WITH GFR, CK  Pain in right femur - Plan: XR FEMUR, MIN 2 VIEWS RIGHT. The right femur x-ray was unremarkable. She's been having severe discomfort and difficulty walking and describes pain in proximal femur. We will schedule CT scan of her femur to evaluate this further.  Trochanteric bursitis, right hip - severe pain and discomfort walking. She sits for prolonged periods at her job I believe that can't trapezius some of her symptoms. Plan: Ambulatory referral to Physical Therapy  Primary osteoarthritis of both hips: Her old x-rays were reviewed which showed mild osteoarthritic changes.  Primary osteoarthritis of both knees: She continues to have some chronic discomfort in her knee joints. She had some intentional weight loss which should be beneficial.  Primary osteoarthritis of both feet: Proper fitting shoes were discussed.  Bilateral plantar  fasciitis  History of chronic kidney disease - Followed up by Dr. Moshe Cipro    Orders: Orders Placed This Encounter  Procedures  . XR FEMUR, MIN 2 VIEWS RIGHT  . CT FEMUR RIGHT WO CONTRAST  . Sedimentation rate  . CK  . Serum protein electrophoresis with reflex  . CBC with Differential/Platelet  . COMPLETE METABOLIC PANEL WITH GFR  . CK  . Ambulatory referral to Physical Therapy   No orders of the defined types were placed in this encounter.   Face-to-face time spent with patient was 30 minutes. 50% of time was spent in counseling and coordination of care.  Follow-Up Instructions: Return in about 3 months (around 08/18/2016) for Polymyositis, Osteoarthritis.   Bo Merino, MD  Note - This record has been created using Editor, commissioning.  Chart creation errors have been sought, but may not always  have been located. Such creation errors do not reflect on  the standard of medical care.

## 2016-05-13 ENCOUNTER — Ambulatory Visit: Payer: BC Managed Care – PPO | Admitting: Rheumatology

## 2016-05-14 ENCOUNTER — Emergency Department (HOSPITAL_COMMUNITY)
Admission: EM | Admit: 2016-05-14 | Discharge: 2016-05-14 | Disposition: A | Discharge status: Home / Self Care | Payer: BC Managed Care – PPO | Attending: Emergency Medicine | Admitting: Emergency Medicine

## 2016-05-14 ENCOUNTER — Encounter (HOSPITAL_COMMUNITY): Payer: Self-pay | Admitting: *Deleted

## 2016-05-14 DIAGNOSIS — N183 Chronic kidney disease, stage 3 (moderate): Secondary | ICD-10-CM | POA: Insufficient documentation

## 2016-05-14 DIAGNOSIS — H578 Other specified disorders of eye and adnexa: Secondary | ICD-10-CM | POA: Diagnosis present

## 2016-05-14 DIAGNOSIS — H5789 Other specified disorders of eye and adnexa: Secondary | ICD-10-CM

## 2016-05-14 LAB — COOXEMETRY PANEL
Carboxyhemoglobin: 0.7 % (ref 0.5–1.5)
Methemoglobin: 1 % (ref 0.0–1.5)
O2 Saturation: 97.3 %
TOTAL HEMOGLOBIN: 11.5 g/dL — AB (ref 12.0–16.0)

## 2016-05-14 NOTE — ED Triage Notes (Signed)
Pt complains of burning in her eyes for the past 3 weeks, which has gotten worse. Pt states fumes are coming from her car that are irritating her eyes.

## 2016-05-14 NOTE — ED Provider Notes (Signed)
[facility] DEPT Provider Note   CSN: [id number] Arrival date & time: 05/14/16  1939     History   Chief Complaint Chief Complaint  Patient presents with  . Eye Problem    HPI Connie Brady is a 46 y.o. female who presents with 3 weeks of bilateral eye irritation. She reports no aggravating or alleviating factors. She has not tried any medications.  Patient states that she has been having some fumes come out of her car which is causing the irritation. She reports she came to the ED tonight because she is very concerned about getting carbon monoxide poisoning from the fumes. She denies any blurry vision or double vision but does note that she has had to focus more to be able to read things up close. She does not wear glasses or contacts. She denies any itching, foreign body sensation, discharge, redness. She does report some difficulty breathing since this began. She denies any exposure to chemicals, new detergents, soaps, or perfumes.   The history is provided by the patient.    Past Medical History:  Diagnosis Date  . Anemia   . Polymyositis ([place])   . Sjoegren syndrome [facility])     Patient Active Problem List   Diagnosis Date Noted  . History of chronic kidney disease 05/07/2016  . Chronic kidney disease (CKD), stage III (moderate) 03/30/2016  . High risk medication use 01/10/2016  . Primary osteoarthritis of both hips 01/10/2016  . Primary osteoarthritis of both knees 01/10/2016  . Primary osteoarthritis of both feet 01/10/2016  . Bilateral plantar fasciitis 01/10/2016  . Chest pain 01/26/2013  . Polymyositis ([place]) 01/26/2013  . Sjoegren syndrome [facility])     Past Surgical History:  Procedure Laterality Date  . BUNIONECTOMY Bilateral   . KNEE SURGERY     right  . MUSCLE BIOPSY     r/thigh  . PLANTAR FASCIA SURGERY Right     OB History    No data available       Home Medications    Prior to Admission medications   Medication Sig Start Date End Date  Taking? Authorizing Provider  azaTHIOprine (IMURAN) 50 MG tablet TAKE 1 TABLET BY MOUTH DAILY 02/07/16   Bo Merino, MD  calcium carbonate (TUMS - DOSED IN MG ELEMENTAL CALCIUM) 500 MG chewable tablet Chew 1 tablet by mouth daily.    Historical Provider, MD  DULoxetine (CYMBALTA) 20 MG capsule Take 40 mg by mouth daily.  01/06/16   Historical Provider, MD  esomeprazole (NEXIUM) 20 MG capsule Take 20 mg by mouth daily at 12 noon.    Historical Provider, MD  FLUoxetine (PROZAC) 10 MG capsule Take 10 mg by mouth daily.    Historical Provider, MD  gabapentin (NEURONTIN) 300 MG capsule Take 300 mg by mouth at bedtime.  11/19/11   Historical Provider, MD  lidocaine (XYLOCAINE) 5 % ointment  12/11/15   Historical Provider, MD  Multiple Vitamins-Minerals (MULTIVITAMIN WITH MINERALS) tablet Take 1 tablet by mouth daily.    Historical Provider, MD  nitroGLYCERIN (NITROSTAT) 0.4 MG SL tablet Place 1 tablet (0.4 mg total) under the tongue every 5 (five) minutes as needed for chest pain. 01/26/13   Dorothy Spark, MD  phentermine 37.5 MG capsule Take 37.5 mg by mouth every morning.    Historical Provider, MD  pilocarpine (SALAGEN) 5 MG tablet Take 1 tablet (5 mg total) by mouth 3 (three) times daily as needed. 12/09/15   Bo Merino, MD  predniSONE (DELTASONE) 1 MG  tablet Take 2 tablets (2 mg total) by mouth daily with breakfast. 02/25/16   Bo Merino, MD  predniSONE (DELTASONE) 5 MG tablet Take 1 tablet (5 mg total) by mouth daily. For 1 week then taper to 30 mg for 1 week 02/25/16   Bo Merino, MD  traZODone (DESYREL) 100 MG tablet Take 200 mg by mouth at bedtime.  12/01/15   Historical Provider, MD  Vitamin D, Ergocalciferol, (DRISDOL) 50000 units CAPS capsule Take 50,000 Units by mouth every 7 (seven) days.  11/28/15   Historical Provider, MD  zolpidem (AMBIEN) 10 MG tablet Take 10 mg by mouth at bedtime as needed.  12/02/15   Historical Provider, MD    Family History Family History    Problem Relation Age of Onset  . Hypertension Sister   . Hypertension Brother   . Diabetes Brother   . Hypertension Sister   . Hypertension Sister   . Hypertension Brother     Social History Social History  Substance Use Topics  . Smoking status: Never Smoker  . Smokeless tobacco: Never Used  . Alcohol use No     Allergies   Lyrica [pregabalin] and Percocet [oxycodone-acetaminophen]   Review of Systems Review of Systems  All other systems reviewed and are negative.    Physical Exam Updated Vital Signs BP 120/72 (BP Location: Right Arm)   Pulse 80   Temp 97.8 F (36.6 C) (Oral)   Resp 18   LMP 04/11/2016   SpO2 97%   Physical Exam  Constitutional: She appears well-developed and well-nourished.  HENT:  Head: Normocephalic and atraumatic.  Eyes: Conjunctivae, EOM and lids are normal. Pupils are equal, round, and reactive to light. Right eye exhibits no discharge. Left eye exhibits no discharge. Right conjunctiva is not injected. Left conjunctiva is not injected. No scleral icterus.  Cardiovascular: Normal rate, regular rhythm and intact distal pulses.   Pulmonary/Chest: Effort normal and breath sounds normal.  Musculoskeletal: She exhibits no deformity.  Neurological: She is alert.  Skin: Skin is warm and dry.  Psychiatric: She has a normal mood and affect. Her speech is normal and behavior is normal.     ED Treatments / Results  Labs (all labs ordered are listed, but only abnormal results are displayed) Labs Reviewed  COOXEMETRY PANEL - Abnormal; Notable for the following:       Result Value   Total hemoglobin 11.5 (*)    All other components within normal limits    EKG  EKG Interpretation None       Radiology No results found.  Procedures Procedures (including critical care time)  Medications Ordered in ED Medications - No data to display   Initial Impression / Assessment and Plan / ED Course  I have reviewed the triage vital signs and  the nursing notes.  Pertinent labs & imaging results that were available during my care of the patient were reviewed by me and considered in my medical decision making (see chart for details).    46 yo F with h/o Sjogrens with 3 weeks of persistent eye irritation. No redness, blurry vision, pain. Exposed to car fumes daily while driving and is very concerned about carbon monoxide poisoning. Physical exam with no conjunctival injection, no discharge. No pain with EOMI. No foreign body sensation. Low concern for any infectious pathology. Consider worsening of Sjogrens or allergic irritation. Patient is on Pilocarpine and Imuran and is compliant. Discussed with patient that fluorescein staining and [place] lamp examination is not warranted at  this time given history/physical. Patient agrees.   Discussed at length with patient that carbon monoxide poisoning is unlikely given mechanism, but patient is concerned because she has been feeling off. Will eval carbon monoxide level via ABG. Respiratory contacted and will collect.   Carboxy hemoglobin levels evaluated and within normal limits. Discussed with patient. Will plan to discharge her with some eye drops to help with symptomatic relief. Instructed her to follow-up with an eye doctor in the next week. Return precautions discussed. Patient expresses understanding and agreement to plan.   Final Clinical Impressions(s) / ED Diagnoses   Final diagnoses:  Eye irritation    New Prescriptions Discharge Medication List as of 05/14/2016  9:44 PM       Orson Aloe, [place] 05/14/16 [place], MD 05/15/16 [phone]

## 2016-05-14 NOTE — Discharge Instructions (Addendum)
Make an appointment with an eye doctor to have your eyesight evaluated.   You can use over the counter eye drops for relief of symptoms. Consider using artificial tears or Systane.   Return to the Emergency Dept if you have any vision changes, eye redness, eye pain or any other worsening or concerning symptoms.

## 2016-05-19 ENCOUNTER — Ambulatory Visit (INDEPENDENT_AMBULATORY_CARE_PROVIDER_SITE_OTHER): Payer: Self-pay

## 2016-05-19 ENCOUNTER — Encounter: Payer: Self-pay | Admitting: Rheumatology

## 2016-05-19 ENCOUNTER — Ambulatory Visit (INDEPENDENT_AMBULATORY_CARE_PROVIDER_SITE_OTHER): Payer: BC Managed Care – PPO | Admitting: Rheumatology

## 2016-05-19 VITALS — BP 113/74 | HR 101 | Resp 16 | Ht 68.0 in | Wt 193.0 lb

## 2016-05-19 DIAGNOSIS — M7061 Trochanteric bursitis, right hip: Secondary | ICD-10-CM | POA: Diagnosis not present

## 2016-05-19 DIAGNOSIS — M160 Bilateral primary osteoarthritis of hip: Secondary | ICD-10-CM

## 2016-05-19 DIAGNOSIS — Z79899 Other long term (current) drug therapy: Secondary | ICD-10-CM | POA: Diagnosis not present

## 2016-05-19 DIAGNOSIS — M722 Plantar fascial fibromatosis: Secondary | ICD-10-CM

## 2016-05-19 DIAGNOSIS — Z87448 Personal history of other diseases of urinary system: Secondary | ICD-10-CM

## 2016-05-19 DIAGNOSIS — M19071 Primary osteoarthritis, right ankle and foot: Secondary | ICD-10-CM

## 2016-05-19 DIAGNOSIS — M898X5 Other specified disorders of bone, thigh: Secondary | ICD-10-CM

## 2016-05-19 DIAGNOSIS — M3501 Sicca syndrome with keratoconjunctivitis: Secondary | ICD-10-CM

## 2016-05-19 DIAGNOSIS — M3320 Polymyositis, organ involvement unspecified: Secondary | ICD-10-CM | POA: Diagnosis not present

## 2016-05-19 DIAGNOSIS — M19072 Primary osteoarthritis, left ankle and foot: Secondary | ICD-10-CM

## 2016-05-19 DIAGNOSIS — M170 Bilateral primary osteoarthritis of knee: Secondary | ICD-10-CM

## 2016-05-19 NOTE — Patient Instructions (Signed)
Labs due July CBC,CMP, CK Decrease prednisone by 1 mg every 2 months

## 2016-05-20 LAB — SEDIMENTATION RATE: Sed Rate: 16 mm/hr (ref 0–20)

## 2016-05-20 LAB — CK: CK TOTAL: 74 U/L (ref 7–177)

## 2016-05-22 LAB — PROTEIN ELECTROPHORESIS, SERUM, WITH REFLEX
Albumin ELP: 3.6 g/dL — ABNORMAL LOW (ref 3.8–4.8)
Alpha-1-Globulin: 0.3 g/dL (ref 0.2–0.3)
Alpha-2-Globulin: 0.8 g/dL (ref 0.5–0.9)
Beta 2: 0.5 g/dL (ref 0.2–0.5)
Beta Globulin: 0.4 g/dL (ref 0.4–0.6)
Gamma Globulin: 1.2 g/dL (ref 0.8–1.7)
Total Protein, Serum Electrophoresis: 6.8 g/dL (ref 6.1–8.1)

## 2016-05-22 NOTE — Progress Notes (Signed)
Labs are stable. CK normal

## 2016-05-26 ENCOUNTER — Ambulatory Visit (HOSPITAL_COMMUNITY)
Admission: RE | Admit: 2016-05-26 | Discharge: 2016-05-26 | Disposition: A | Discharge status: Home / Self Care | Payer: BC Managed Care – PPO | Source: Ambulatory Visit | Attending: Rheumatology | Admitting: Rheumatology

## 2016-05-26 DIAGNOSIS — M898X5 Other specified disorders of bone, thigh: Secondary | ICD-10-CM | POA: Diagnosis present

## 2016-05-26 NOTE — Progress Notes (Signed)
CT scan of femur normal. Please notify patient.

## 2016-05-27 ENCOUNTER — Telehealth: Payer: Self-pay | Admitting: Rheumatology

## 2016-05-27 NOTE — Telephone Encounter (Signed)
Patient called this morning in regards to her PT.  She stated that Dr. Estanislado Pandy gave her a card to one facility and now she is receiving calls from [facility].  She is confused as to which one she wants her to go to.  I told her that the referral shows [facility].  CB#[phone].  Thank you.

## 2016-05-27 NOTE — Telephone Encounter (Signed)
Patient states [facility] called her this morning stating they received a referral for her and wanted to set up an appointment with her. Patient states when she left the office at her last appointment she received the referral paper and a card to [facility] to see Ileana Roup. Patient states she already has an appointment to see Ileana Roup set up. Patient advised what may have happen is that the referral was placed as an internal referral verus an external referral which would land the referral in the workqueue for the PT office to call and schedule her. Patient advise she may keep her appointment with Ileana Roup and there is not a need to change offices.

## 2016-06-04 ENCOUNTER — Other Ambulatory Visit: Payer: Self-pay | Admitting: Rheumatology

## 2016-06-04 MED ORDER — AZATHIOPRINE 50 MG PO TABS: 50.0000 mg | ORAL_TABLET | Freq: Every day | ORAL | 0 refills | Status: DC

## 2016-06-04 NOTE — Telephone Encounter (Signed)
Last Visit: 05/19/16 Next Visit: 08/26/16 Labs: 04/23/16 Stable  Okay to refill Imuran?   She also stated that the physical therapy center we referred to can do everything on the order with the exception of the tens unit and the cortisone injection.  She is wanting to know if she needs a referral to get those done

## 2016-06-04 NOTE — Telephone Encounter (Signed)
Patient called requesting a refill on hr Azathioprine.  She uses [facility] on [address].  She also stated that the physical therapy center we referred to can do everything on the order with the exception of the tens unit and the cortisone injection.  She is wanting to know if she needs a referral to get those done.  CB#[phone].  Thank you.

## 2016-06-04 NOTE — Telephone Encounter (Signed)
Ok, and ok to send rx

## 2016-06-04 NOTE — Telephone Encounter (Signed)
Did you really intend for this patient to have a TENS unit?

## 2016-07-27 ENCOUNTER — Telehealth: Payer: Self-pay

## 2016-07-27 NOTE — Telephone Encounter (Signed)
Patient stated that she is still having pain in multiple places, especially her right leg.  Went to [facility] for physical therapy, but therapy was discontinued due to therapist not being able to do anything for patient.  Patient would like to know what is the next step?  CB# [phone].

## 2016-07-27 NOTE — Telephone Encounter (Signed)
Patient states she is still having pain in her right hips, her buttocks, right hamstring, and in her right leg below her calf. Patient states she has seen the physical therapist. Patient states the physical therapist felt like she wasn't helping the pain so she discontinued any further therapy. Patient would like to to know what know what the next step is.

## 2016-07-28 NOTE — Telephone Encounter (Signed)
Patient scheduled for 08/11/16 at 9:45 am

## 2016-07-28 NOTE — Telephone Encounter (Signed)
Patient states she is still having pain in her right hips, her buttocks, right hamstring, and in her right leg below her calf. Patient states she has seen the physical therapist. Patient states the physical therapist felt like she wasn't helping the pain so she discontinued any further therapy. Patient would like to to know what know what the next step is.

## 2016-07-28 NOTE — Telephone Encounter (Signed)
Sch appt

## 2016-07-29 ENCOUNTER — Telehealth: Payer: Self-pay | Admitting: Rheumatology

## 2016-07-29 NOTE — Telephone Encounter (Signed)
  Patient continues to complain of pain lower extremities. She would like sooner appt, the soonest we could find was June 26th. Please review and let us know if anything else is recommended for her. I have put notes from your last visit below. She states physical therapy d/c her visits due to the amount of pain she was having  Can we discuss?    Pain in right femur - Plan: XR FEMUR, MIN 2 VIEWS RIGHT. The right femur x-ray was unremarkable. She's been having severe discomfort and difficulty walking and describes pain in proximal femur. We will schedule CT scan of her femur to evaluate this further.  Trochanteric bursitis, right hip - severe pain and discomfort walking. She sits for prolonged periods at her job I believe that can't trapezius some of her symptoms. Plan: Ambulatory referral to Physical Therapy  Primary osteoarthritis of both hips: Her old x-rays were reviewed which showed mild osteoarthritic changes.  Primary osteoarthritis of both knees: She continues to have some chronic discomfort in her knee joints. She had some intentional weight loss which should be beneficial.  Primary osteoarthritis of both feet: Proper fitting shoes were discussed.  Bilateral plantar fasciitis

## 2016-07-29 NOTE — Telephone Encounter (Signed)
I would recommend her to see ortho as I do not have anything further to add.

## 2016-07-29 NOTE — Telephone Encounter (Signed)
Patient called stating that she needs to get in sooner than her June 26 appointment with Dr. Estanislado Pandy, she is in a lot of pain.  CB#[phone].  Thank you.

## 2016-07-30 ENCOUNTER — Encounter (INDEPENDENT_AMBULATORY_CARE_PROVIDER_SITE_OTHER): Payer: Self-pay | Admitting: Orthopaedic Surgery

## 2016-07-30 ENCOUNTER — Ambulatory Visit (INDEPENDENT_AMBULATORY_CARE_PROVIDER_SITE_OTHER): Payer: BC Managed Care – PPO | Admitting: Orthopaedic Surgery

## 2016-07-30 VITALS — BP 92/57 | HR 79 | Resp 14 | Ht 68.0 in | Wt 190.0 lb

## 2016-07-30 DIAGNOSIS — M5440 Lumbago with sciatica, unspecified side: Secondary | ICD-10-CM | POA: Diagnosis not present

## 2016-07-30 DIAGNOSIS — M79604 Pain in right leg: Secondary | ICD-10-CM

## 2016-07-30 NOTE — Progress Notes (Signed)
Office Visit Note   Patient: Connie Brady           Date of Birth: [birth year]           MRN: [id number] Visit Date: 07/30/2016              Requested by: Kelton Pillar, MD [address] [place], [place] [address] PCP: Kelton Pillar, MD   Assessment & Plan: Visit Diagnoses:  1. Low back pain with sciatica, sciatica laterality unspecified, unspecified back pain laterality, unspecified chronicity   2. Pain in right leg   Progressive pain in right leg since October with a negative CT scan of the right femur.. I believe that the pain could originate in the lumbar spine and we'll order an MRI scan  Plan: MRI scan without contrast lumbar spine  Follow-Up Instructions: Return after MRI.   Orders:  Orders Placed This Encounter  Procedures  . MR Lumbar Spine w/o contrast   No orders of the defined types were placed in this encounter.     Procedures: No procedures performed   Clinical Data: No additional findings.   Subjective: Chief Complaint  Patient presents with  . Right Hip - Pain    Connie Brady is a 46 y.o HX  of polymyositis and Sjogren's. She states having pain and discomfort in the right trochanteric area. She has been going toPT  and  dry needling without much results. She feels some tightness in her calves bilaterally and  muscl  . Right Foot - Pain, Weakness     She feels that her bilateral lower extremities are swollen. She continues to have pain and discomfort in her bilateral knee joints and feet. She continues to have discomfort in plantar fascia.  Connie Brady has seen Dr. Estanislado Pandy for evaluation of her right lower extremity pain. CT scan of the right femur was negative. Connie Brady related initial insidious onset of pain in about October without injury or trauma. Pain was first localized in the area of her right hemipelvis and lateral aspect of her hip. Over time it seems to progress to along the thigh and even into the calf and her right foot.  She denies any numbness or tingling. She has had a problem with her back in the past and in fact had an MRI scan performed and 11 at 2014 of the lumbar spine. The report demonstrated a mild left paracentral disc bulge and bilateral facet arthropathy at L4-5 right greater than left. She's never had prior surgery. She's just recently began to develop some discomfort in her left lower extremity. She denies any bowel or bladder dysfunction. She's not had any groin pain or specific knee discomfort. I did review the films of her pelvis from November 17 didn't see any obvious bony abnormality. I thought it joints are relatively clear films of the lumbar spine were also evaluated. There was some mild degenerative changes at L4-5 and L5-S1 with no evidence of listhesis.  HPI  Review of Systems   Objective: Vital Signs: BP (!) 92/57   Pulse 79   Resp 14   Ht 5\' 8"  (1.727 m)   Wt 190 lb (86.2 kg)   BMI 28.89 kg/m   Physical Exam  Ortho Exam painless range of motion of both hips with internal/external rotation. Straight leg raise was positive at about 90 on the right side of her posterior calf and thigh pain. Negative on the left. Painless range of motion of both hips and knees. No percussible  tenderness lumbar spine. No masses palpated. Neurovascular exam intact. No calf pain.  Specialty Comments:  No specialty comments available.  Imaging: No results found.   PMFS History: Patient Active Problem List   Diagnosis Date Noted  . History of chronic kidney disease 05/07/2016  . Chronic kidney disease (CKD), stage III (moderate) 03/30/2016  . High risk medication use 01/10/2016  . Primary osteoarthritis of both hips 01/10/2016  . Primary osteoarthritis of both knees 01/10/2016  . Primary osteoarthritis of both feet 01/10/2016  . Bilateral plantar fasciitis 01/10/2016  . Chest pain 01/26/2013  . Polymyositis ([place]) 01/26/2013  . Sjoegren syndrome [facility])    Past Medical History:  Diagnosis  Date  . Anemia   . Polymyositis ([place])   . Sjoegren syndrome ([place])     Family History  Problem Relation Age of Onset  . Hypertension Sister   . Hypertension Brother   . Diabetes Brother   . Hypertension Sister   . Hypertension Sister   . Hypertension Brother     Past Surgical History:  Procedure Laterality Date  . BUNIONECTOMY Bilateral   . KNEE SURGERY     right  . MUSCLE BIOPSY     r/thigh  . PLANTAR FASCIA SURGERY Right    Social History   Occupational History  . Not on file.   Social History Main Topics  . Smoking status: Never Smoker  . Smokeless tobacco: Never Used  . Alcohol use No  . Drug use: No  . Sexual activity: Not on file     Garald Balding, MD   Note - This record has been created using [employer].  Chart creation errors have been sought, but may not always  have been located. Such creation errors do not reflect on  the standard of medical care.

## 2016-07-30 NOTE — Telephone Encounter (Signed)
Patient has been advised of recommendation and has been in to see Dr. Durward Fortes.

## 2016-08-11 ENCOUNTER — Ambulatory Visit: Payer: Self-pay | Admitting: Rheumatology

## 2016-08-12 ENCOUNTER — Ambulatory Visit
Admission: RE | Admit: 2016-08-12 | Discharge: 2016-08-12 | Disposition: A | Discharge status: Home / Self Care | Payer: BC Managed Care – PPO | Source: Ambulatory Visit | Attending: Orthopaedic Surgery | Admitting: Orthopaedic Surgery

## 2016-08-12 DIAGNOSIS — M5440 Lumbago with sciatica, unspecified side: Secondary | ICD-10-CM

## 2016-08-18 ENCOUNTER — Encounter (INDEPENDENT_AMBULATORY_CARE_PROVIDER_SITE_OTHER): Payer: Self-pay | Admitting: Orthopaedic Surgery

## 2016-08-18 ENCOUNTER — Ambulatory Visit (INDEPENDENT_AMBULATORY_CARE_PROVIDER_SITE_OTHER): Payer: BC Managed Care – PPO | Admitting: Orthopaedic Surgery

## 2016-08-18 ENCOUNTER — Other Ambulatory Visit (INDEPENDENT_AMBULATORY_CARE_PROVIDER_SITE_OTHER): Payer: Self-pay

## 2016-08-18 VITALS — BP 101/61 | HR 99 | Resp 14 | Ht 68.0 in | Wt 190.0 lb

## 2016-08-18 DIAGNOSIS — M5441 Lumbago with sciatica, right side: Secondary | ICD-10-CM | POA: Diagnosis not present

## 2016-08-18 DIAGNOSIS — M47817 Spondylosis without myelopathy or radiculopathy, lumbosacral region: Secondary | ICD-10-CM

## 2016-08-18 DIAGNOSIS — G8929 Other chronic pain: Secondary | ICD-10-CM | POA: Diagnosis not present

## 2016-08-18 DIAGNOSIS — M5442 Lumbago with sciatica, left side: Secondary | ICD-10-CM

## 2016-08-18 DIAGNOSIS — M519 Unspecified thoracic, thoracolumbar and lumbosacral intervertebral disc disorder: Secondary | ICD-10-CM

## 2016-08-18 DIAGNOSIS — M4697 Unspecified inflammatory spondylopathy, lumbosacral region: Secondary | ICD-10-CM | POA: Diagnosis not present

## 2016-08-18 NOTE — Progress Notes (Signed)
Office Visit Note   Patient: Connie Brady           Date of Birth: 08/18/1970           MRN: [id number] Visit Date: 08/18/2016              Requested by: Kelton Pillar, MD [address] [place], [place] [address] PCP: Kelton Pillar, MD   Assessment & Plan: Visit Diagnoses:  1. Chronic bilateral low back pain with bilateral sciatica   2. Facet arthritis of lumbosacral region [facility])     Plan:  #1: At this time our plan is for them to be seen at [facility] for her LS spine injections for the worsening facet arthropathy as well as the left posterior lateral disc herniation with the recent change.  Follow-Up Instructions: Return in about 1 month (around 09/18/2016).   Office visit face-to-face was partially 45 minutes between Dr. Durward Fortes and myself. 50% of the time was in counseling.  Orders:  No orders of the defined types were placed in this encounter.  No orders of the defined types were placed in this encounter.     Procedures: No procedures performed   Clinical Data: No additional findings.   Subjective: Bilateral leg pain   Connie Brady is 46 year old white female who is seen today for follow-up evaluation of bilateral lower extremity pain. Connie Brady had seen Dr. Estanislado Pandy for evaluation of her right lower extremity pain. CT scan of the right femur was negative. [name] related initial insidious onset of pain in about October without injury or trauma. Pain was first localized in the area of her right hemipelvis and lateral aspect of her hip. Over time it seems to progress to along the thigh and even into the calf and her right foot. Connie Brady denies any numbness or tingling. Connie Brady has had a problem with her back in the past and in fact had an MRI scan performed and 11 at 2014 of the lumbar spine. The report demonstrated a mild left paracentral disc bulge and bilateral facet arthropathy at L4-5 right greater than left. Connie Brady's never had prior surgery. Connie Brady's  just recently began to develop some discomfort in her left lower extremity. Connie Brady denies any bowel or bladder dysfunction. Connie Brady's not had any groin pain or specific knee discomfort. Connie Brady had an MRI scan performed and is seen today for review of the MRI scan.    Review of Systems  All other systems reviewed and are negative.    Objective: Vital Signs: BP 101/61   Pulse 99   Resp 14   Ht 5\' 8"  (1.727 m)   Wt 190 lb (86.2 kg)   BMI 28.89 kg/m   Physical Exam  Constitutional: Connie Brady is oriented to person, place, and time. Connie Brady appears well-developed and well-nourished.  HENT:  Head: Normocephalic and atraumatic.  Eyes: EOM are normal. Pupils are equal, round, and reactive to light.  Pulmonary/Chest: Effort normal.  Neurological: Connie Brady is alert and oriented to person, place, and time.  Skin: Skin is warm and dry.  Psychiatric: Connie Brady has a normal mood and affect. Her behavior is normal. Judgment and thought content normal.    Ortho Exam  Ortho Exam painless range of motion of both hips with internal/external rotation. Straight leg raise was positive at about 90 on the right side of her posterior calf and thigh pain. Negative on the left. Painless range of motion of both hips and knees. No percussible tenderness lumbar spine. No masses palpated.  Neurovascular exam intact  Specialty Comments:  No specialty comments available.  Imaging: Mr Lumbar Spine W/o Contrast  Result Date: 08/13/2016 CLINICAL DATA:  Right buttock and posterior thigh pain, 9 months duration. Right leg weakness. EXAM: MRI LUMBAR SPINE WITHOUT CONTRAST TECHNIQUE: Multiplanar, multisequence MR imaging of the lumbar spine was performed. No intravenous contrast was administered. COMPARISON:  Radiography 01/14/2016.  MRI 12/31/2012. FINDINGS: Segmentation:  5 lumbar type vertebral bodies. Alignment: Mild curvature convex to the left with the apex at L3. 2 mm anterolisthesis L4-5. Vertebrae:  No fracture or primary bone lesion. Conus  medullaris: Extends to the L2 level and appears normal. Paraspinal and other soft tissues: Normal Disc levels: No abnormality at L3-4 or above. L4-5: Bilateral facet arthropathy with mild edema. Anterolisthesis of 2 mm. Shallow disc herniation centrally and towards the left with slight upward turning. This indents the left side of the thecal sac slightly and encroaches mildly upon the proximal foramen. This could cause left-sided symptoms. No right-sided neural compression is seen. The facet arthropathy could be associated with low back pain or referred facet syndrome pain. L5-S1: Mild bulging of the disc. Mild facet osteoarthritis. No neural compression. Compared to the previous study of 2014, the L4-5 disc herniation is slightly larger. The facet arthropathy has worsened. IMPRESSION: L4-5: Shallow central to left posterolateral disc herniation with slight upward turning. Slight enlargement since 2014. Mild stenosis of the left lateral recess and proximal foramen on the left could possibly cause left-sided neural irritation. No right-sided neural compression is demonstrated. The patient does have worsening facet arthropathy at this level with edematous change, right worse than left. There is 2 mm anterolisthesis not seen previously. This could result in low back pain or referred facet syndrome pain. L5-S1 minimal disc degeneration and bulge and mild facet osteoarthritis. No stenosis. Electronically Signed   By: Nelson Chimes M.D.   On: 08/13/2016 07:27   PMFS History: Patient Active Problem List   Diagnosis Date Noted  . History of chronic kidney disease 05/07/2016  . Chronic kidney disease (CKD), stage III (moderate) 03/30/2016  . High risk medication use 01/10/2016  . Primary osteoarthritis of both hips 01/10/2016  . Primary osteoarthritis of both knees 01/10/2016  . Primary osteoarthritis of both feet 01/10/2016  . Bilateral plantar fasciitis 01/10/2016  . Chest pain 01/26/2013  . Polymyositis ([place])  01/26/2013  . Sjoegren syndrome [facility])    Past Medical History:  Diagnosis Date  . Anemia   . Polymyositis ([place])   . Sjoegren syndrome ([place])     Family History  Problem Relation Age of Onset  . Hypertension Sister   . Hypertension Brother   . Diabetes Brother   . Hypertension Sister   . Hypertension Sister   . Hypertension Brother     Past Surgical History:  Procedure Laterality Date  . BUNIONECTOMY Bilateral   . KNEE SURGERY     right  . MUSCLE BIOPSY     r/thigh  . PLANTAR FASCIA SURGERY Right    Social History   Occupational History  . Not on file.   Social History Main Topics  . Smoking status: Never Smoker  . Smokeless tobacco: Never Used  . Alcohol use No  . Drug use: No  . Sexual activity: Not on file

## 2016-08-20 ENCOUNTER — Other Ambulatory Visit: Payer: Self-pay

## 2016-08-20 DIAGNOSIS — Z79899 Other long term (current) drug therapy: Secondary | ICD-10-CM

## 2016-08-20 LAB — COMPLETE METABOLIC PANEL WITH GFR
ALT: 7 U/L (ref 6–29)
AST: 12 U/L (ref 10–35)
Albumin: 3.6 g/dL (ref 3.6–5.1)
Alkaline Phosphatase: 57 U/L (ref 33–115)
BILIRUBIN TOTAL: 0.3 mg/dL (ref 0.2–1.2)
BUN: 13 mg/dL (ref 7–25)
CALCIUM: 8.7 mg/dL (ref 8.6–10.2)
CHLORIDE: 106 mmol/L (ref 98–110)
CO2: 22 mmol/L (ref 20–31)
CREATININE: 1.26 mg/dL — AB (ref 0.50–1.10)
GFR, EST AFRICAN AMERICAN: 59 mL/min — AB (ref >=60)
GFR, Est Non African American: 51 mL/min — ABNORMAL LOW (ref >=60)
Glucose, Bld: 95 mg/dL (ref 65–99)
Potassium: 4.4 mmol/L (ref 3.5–5.3)
Sodium: 138 mmol/L (ref 135–146)
TOTAL PROTEIN: 6.3 g/dL (ref 6.1–8.1)

## 2016-08-20 LAB — CBC WITH DIFFERENTIAL/PLATELET
BASOS PCT: 0 %
Basophils Absolute: 0 cells/uL (ref 0–200)
EOS ABS: 0 {cells}/uL — AB (ref 15–500)
Eosinophils Relative: 0 %
HEMATOCRIT: 36.9 % (ref 35.0–45.0)
Hemoglobin: 12 g/dL (ref 11.7–15.5)
LYMPHS PCT: 23 %
Lymphs Abs: 1518 cells/uL (ref 850–3900)
MCH: 27.9 pg (ref 27.0–33.0)
MCHC: 32.5 g/dL (ref 32.0–36.0)
MCV: 85.8 fL (ref 80.0–100.0)
MONO ABS: 330 {cells}/uL (ref 200–950)
MPV: 9.2 fL (ref 7.5–12.5)
Monocytes Relative: 5 %
NEUTROS ABS: 4752 {cells}/uL (ref 1500–7800)
Neutrophils Relative %: 72 %
PLATELETS: 219 10*3/uL (ref 140–400)
RBC: 4.3 MIL/uL (ref 3.80–5.10)
RDW: 14 % (ref 11.0–15.0)
WBC: 6.6 10*3/uL (ref 3.8–10.8)

## 2016-08-20 NOTE — Progress Notes (Signed)
Office Visit Note  Patient: Connie Brady             Date of Birth: 14-May-1970           MRN: [id number]             PCP: Kelton Pillar, MD Referring: Kelton Pillar, MD Visit Date: 08/26/2016 Occupation: @GUAROCC @    Subjective:  Bilateral lower extremity pain   History of Present Illness: Connie Brady is a 46 y.o. female with history of polymyositis Sjogren's and osteoarthritis. She continues to have pain and discomfort in her bilateral lower extremities. She had recent MRI of her lumbar spine which showed degenerative disc disease and possible radiculopathy related to her lumbar region. She is scheduled to have injection in the lumbar region at the [facility] imaging. She continues to have some pain and discomfort in her bilateral hips knees and feet. Plantar fasciitis is doing better. She does have some sicca symptoms. She denies any increased muscle weakness.  Activities of Daily Living:  Patient reports morning stiffness for all day hours.   Patient Denies nocturnal pain.  Difficulty dressing/grooming: Denies Difficulty climbing stairs: Reports Difficulty getting out of chair: Reports Difficulty using hands for taps, buttons, cutlery, and/or writing: Denies   Review of Systems  Constitutional: Positive for fatigue. Negative for night sweats, weight gain, weight loss and weakness.  HENT: Positive for mouth dryness. Negative for mouth sores, trouble swallowing, trouble swallowing and nose dryness.   Eyes: Positive for dryness. Negative for pain, redness and visual disturbance.  Respiratory: Negative for cough, shortness of breath and difficulty breathing.   Cardiovascular: Negative for chest pain, palpitations, hypertension, irregular heartbeat and swelling in legs/feet.  Gastrointestinal: Negative for blood in stool, constipation and diarrhea.  Endocrine: Negative for increased urination.  Genitourinary: Negative for vaginal dryness.  Musculoskeletal: Positive  for arthralgias, joint pain, myalgias, morning stiffness and myalgias. Negative for joint swelling, muscle weakness and muscle tenderness.  Skin: Negative for color change, rash, hair loss, skin tightness, ulcers and sensitivity to sunlight.  Allergic/Immunologic: Negative for susceptible to infections.  Neurological: Negative for dizziness, memory loss and night sweats.  Hematological: Negative for swollen glands.  Psychiatric/Behavioral: Positive for depressed mood and sleep disturbance. The patient is nervous/anxious.     PMFS History:  Patient Active Problem List   Diagnosis Date Noted  . Vitamin D deficiency 08/25/2016  . History of chronic kidney disease 05/07/2016  . Chronic kidney disease (CKD), stage III (moderate) 03/30/2016  . High risk medication use 01/10/2016  . Primary osteoarthritis of both hips 01/10/2016  . Primary osteoarthritis of both knees 01/10/2016  . Primary osteoarthritis of both feet 01/10/2016  . Bilateral plantar fasciitis 01/10/2016  . Chest pain 01/26/2013  . Polymyositis ([place]) 01/26/2013  . Sjoegren syndrome [facility])     Past Medical History:  Diagnosis Date  . Anemia   . Polymyositis ([place])   . Sjoegren syndrome ([place])     Family History  Problem Relation Age of Onset  . Hypertension Sister   . Hypertension Brother   . Diabetes Brother   . Hypertension Sister   . Hypertension Sister   . Hypertension Brother    Past Surgical History:  Procedure Laterality Date  . BUNIONECTOMY Bilateral   . KNEE SURGERY     right  . MUSCLE BIOPSY     r/thigh  . PLANTAR FASCIA SURGERY Right    Social History   Social History Narrative  . No narrative on file  Objective: Vital Signs: BP 130/68   Pulse 72   Resp 14   Ht 5\' 8"  (1.727 m)   Wt 195 lb (88.5 kg)   BMI 29.65 kg/m    Physical Exam  Constitutional: She is oriented to person, place, and time. She appears well-developed and well-nourished.  HENT:  Head: Normocephalic and atraumatic.    Eyes: Conjunctivae and EOM are normal.  Neck: Normal range of motion.  Cardiovascular: Normal rate, regular rhythm, normal heart sounds and intact distal pulses.   Pulmonary/Chest: Effort normal and breath sounds normal.  Abdominal: Soft. Bowel sounds are normal.  Lymphadenopathy:    She has no cervical adenopathy.  Neurological: She is alert and oriented to person, place, and time.  Skin: Skin is warm and dry. Capillary refill takes less than 2 seconds.  Psychiatric: She has a normal mood and affect. Her behavior is normal.  Nursing note and vitals reviewed.    Musculoskeletal Exam: C-spine and thoracic spine good range of motion. Her lumbar spine has discomfort with range of motion. Shoulder joints elbow joints wrist joint MCPs PIPs DIPs with good range of motion. Hip joints knee joints ankles MTPs PIPs DIPs with good range of motion with no synovitis.  CDAI Exam: No CDAI exam completed.    Investigation: No additional findings. CBC Latest Ref Rng & Units 08/20/2016 04/23/2016 03/13/2016  WBC 3.8 - 10.8 K/uL 6.6 8.1 5.8  Hemoglobin 11.7 - 15.5 g/dL 12.0 12.5 13.4  Hematocrit 35.0 - 45.0 % 36.9 38.6 40.4  Platelets 140 - 400 K/uL 219 231 222   CMP Latest Ref Rng & Units 08/20/2016 04/23/2016 03/13/2016  Glucose 65 - 99 mg/dL 95 54(L) 112(H)  BUN 7 - 25 mg/dL 13 12 14   Creatinine 0.50 - 1.10 mg/dL 1.26(H) 1.37(H) 1.36(H)  Sodium 135 - 146 mmol/L 138 138 142  Potassium 3.5 - 5.3 mmol/L 4.4 5.1 4.0  Chloride 98 - 110 mmol/L 106 103 107  CO2 20 - 31 mmol/L 22 25 24   Calcium 8.6 - 10.2 mg/dL 8.7 8.9 9.2  Total Protein 6.1 - 8.1 g/dL 6.3 6.3 6.7  Total Bilirubin 0.2 - 1.2 mg/dL 0.3 0.5 0.4  Alkaline Phos 33 - 115 U/L 57 51 53  AST 10 - 35 U/L 12 16 13   ALT 6 - 29 U/L 7 10 9    CK 98 Imaging: Mr Lumbar Spine W/o Contrast  Result Date: 08/13/2016 CLINICAL DATA:  Right buttock and posterior thigh pain, 9 months duration. Right leg weakness. EXAM: MRI LUMBAR SPINE WITHOUT CONTRAST  TECHNIQUE: Multiplanar, multisequence MR imaging of the lumbar spine was performed. No intravenous contrast was administered. COMPARISON:  Radiography 01/14/2016.  MRI 12/31/2012. FINDINGS: Segmentation:  5 lumbar type vertebral bodies. Alignment: Mild curvature convex to the left with the apex at L3. 2 mm anterolisthesis L4-5. Vertebrae:  No fracture or primary bone lesion. Conus medullaris: Extends to the L2 level and appears normal. Paraspinal and other soft tissues: Normal Disc levels: No abnormality at L3-4 or above. L4-5: Bilateral facet arthropathy with mild edema. Anterolisthesis of 2 mm. Shallow disc herniation centrally and towards the left with slight upward turning. This indents the left side of the thecal sac slightly and encroaches mildly upon the proximal foramen. This could cause left-sided symptoms. No right-sided neural compression is seen. The facet arthropathy could be associated with low back pain or referred facet syndrome pain. L5-S1: Mild bulging of the disc. Mild facet osteoarthritis. No neural compression. Compared to the previous study of 2014,  the L4-5 disc herniation is slightly larger. The facet arthropathy has worsened. IMPRESSION: L4-5: Shallow central to left posterolateral disc herniation with slight upward turning. Slight enlargement since 2014. Mild stenosis of the left lateral recess and proximal foramen on the left could possibly cause left-sided neural irritation. No right-sided neural compression is demonstrated. The patient does have worsening facet arthropathy at this level with edematous change, right worse than left. There is 2 mm anterolisthesis not seen previously. This could result in low back pain or referred facet syndrome pain. L5-S1 minimal disc degeneration and bulge and mild facet osteoarthritis. No stenosis. Electronically Signed   By: Nelson Chimes M.D.   On: 08/13/2016 07:27    Speciality Comments: No specialty comments available.    Procedures:  No  procedures performed Allergies: Lyrica [pregabalin] and Percocet [oxycodone-acetaminophen]   Assessment / Plan:     Visit Diagnoses: Polymyositis ([place]) - History of elevated CK, positive muscle biopsy, myositis panel negative, initial CK 522 -her most recent CK is within normal limits.. Detailed discussion regarding tapering prednisone. I've advised her to reduce prednisone by 1 mg every 2 months until she reaches 5 mg by mouth daily. We will continue current dose of Imuran.  High risk medication use - Imuran 50 mg by mouth daily, prednisone 7 mg by mouth every morning - Plan: CBC with Differential/Platelet, COMPLETE METABOLIC PANEL WITH GFR, CANCELED: CBC with Differential/Platelet, CANCELED: COMPLETE METABOLIC PANEL WITH GFR  Sjogren's syndrome with keratoconjunctivitis sicca (HCC) - ANA 1:40 centromere, +SSA. On pilocarpine 5 mg by mouth 3 times a day when necessary  Primary osteoarthritis of both hips chronic pain  Primary osteoarthritis of both knees: Chronic pain  Primary osteoarthritis of both feet: Proper fitting shoes were discussed  Bilateral plantar fasciitis: She is off and on flares  DDD Lumbar spine: She has some discomfort in the lower back and the pain is radiating into her lower extremities. She has cortisone injection as scheduled.  History of chronic kidney disease - Followed up by Dr. Moshe Cipro  Vitamin D deficiency - Plan: VITAMIN D 25 Hydroxy (Vit-D Deficiency, Fractures)   Chronic prednisone therapy: Patient does not recall when she had her last bone density I'll schedule a DEXA scan.   Orders: Orders Placed This Encounter  Procedures  . VITAMIN D 25 Hydroxy (Vit-D Deficiency, Fractures)  . CK  . CBC with Differential/Platelet  . COMPLETE METABOLIC PANEL WITH GFR   No orders of the defined types were placed in this encounter.   Face-to-face time spent with patient was 30 minutes. 50% of time was spent in counseling and coordination of care.  Follow-Up  Instructions: Return in about 5 months (around 01/26/2017) for Polymyositis.   Bo Merino, MD  Note - This record has been created using [employer].  Chart creation errors have been sought, but may not always  have been located. Such creation errors do not reflect on  the standard of medical care.

## 2016-08-21 ENCOUNTER — Other Ambulatory Visit (INDEPENDENT_AMBULATORY_CARE_PROVIDER_SITE_OTHER): Payer: Self-pay | Admitting: Orthopaedic Surgery

## 2016-08-21 DIAGNOSIS — M519 Unspecified thoracic, thoracolumbar and lumbosacral intervertebral disc disorder: Secondary | ICD-10-CM

## 2016-08-21 LAB — CK: Total CK: 98 U/L (ref 29–143)

## 2016-08-21 NOTE — Progress Notes (Signed)
stable °

## 2016-08-25 DIAGNOSIS — E559 Vitamin D deficiency, unspecified: Secondary | ICD-10-CM | POA: Insufficient documentation

## 2016-08-26 ENCOUNTER — Encounter: Payer: Self-pay | Admitting: Rheumatology

## 2016-08-26 ENCOUNTER — Ambulatory Visit (INDEPENDENT_AMBULATORY_CARE_PROVIDER_SITE_OTHER): Payer: BC Managed Care – PPO | Admitting: Rheumatology

## 2016-08-26 VITALS — BP 130/68 | HR 72 | Resp 14 | Ht 68.0 in | Wt 195.0 lb

## 2016-08-26 DIAGNOSIS — M19072 Primary osteoarthritis, left ankle and foot: Secondary | ICD-10-CM

## 2016-08-26 DIAGNOSIS — Z79899 Other long term (current) drug therapy: Secondary | ICD-10-CM

## 2016-08-26 DIAGNOSIS — M7061 Trochanteric bursitis, right hip: Secondary | ICD-10-CM | POA: Diagnosis not present

## 2016-08-26 DIAGNOSIS — M160 Bilateral primary osteoarthritis of hip: Secondary | ICD-10-CM

## 2016-08-26 DIAGNOSIS — E559 Vitamin D deficiency, unspecified: Secondary | ICD-10-CM

## 2016-08-26 DIAGNOSIS — M47816 Spondylosis without myelopathy or radiculopathy, lumbar region: Secondary | ICD-10-CM

## 2016-08-26 DIAGNOSIS — M3320 Polymyositis, organ involvement unspecified: Secondary | ICD-10-CM

## 2016-08-26 DIAGNOSIS — M3501 Sicca syndrome with keratoconjunctivitis: Secondary | ICD-10-CM | POA: Diagnosis not present

## 2016-08-26 DIAGNOSIS — M722 Plantar fascial fibromatosis: Secondary | ICD-10-CM

## 2016-08-26 DIAGNOSIS — Z87448 Personal history of other diseases of urinary system: Secondary | ICD-10-CM

## 2016-08-26 DIAGNOSIS — Z7952 Long term (current) use of systemic steroids: Secondary | ICD-10-CM

## 2016-08-26 DIAGNOSIS — M170 Bilateral primary osteoarthritis of knee: Secondary | ICD-10-CM

## 2016-08-26 DIAGNOSIS — M19071 Primary osteoarthritis, right ankle and foot: Secondary | ICD-10-CM

## 2016-08-26 NOTE — Addendum Note (Signed)
Addended byCandice Camp on: 08/26/2016 10:59 AM   Modules accepted: Orders

## 2016-08-26 NOTE — Patient Instructions (Addendum)
Standing Labs We placed an order today for your standing lab work.    Please come back and get your standing labs in October and every 3 months CK and vitamin D will be done with the next lab  We have open lab Monday through Friday from 8:30-11:30 AM and 1:30-4 PM at the office of Dr. Bo Merino.   The office is located at [address], [place], [place], [place] [address] No appointment is necessary.   Labs are drawn by [facility].  You may receive a bill from [facility] for your lab work. If you have any questions regarding directions or hours of operation,  please call [phone].    Decrease prednisone to 6 mg every day for the next 2 months and then stay on 5 mg every day

## 2016-08-31 ENCOUNTER — Ambulatory Visit
Admission: RE | Admit: 2016-08-31 | Discharge: 2016-08-31 | Disposition: A | Discharge status: Home / Self Care | Payer: BC Managed Care – PPO | Source: Ambulatory Visit | Attending: Orthopaedic Surgery | Admitting: Orthopaedic Surgery

## 2016-08-31 DIAGNOSIS — E669 Obesity, unspecified: Secondary | ICD-10-CM | POA: Insufficient documentation

## 2016-08-31 DIAGNOSIS — N943 Premenstrual tension syndrome: Secondary | ICD-10-CM | POA: Insufficient documentation

## 2016-08-31 DIAGNOSIS — M519 Unspecified thoracic, thoracolumbar and lumbosacral intervertebral disc disorder: Secondary | ICD-10-CM

## 2016-08-31 DIAGNOSIS — R87619 Unspecified abnormal cytological findings in specimens from cervix uteri: Secondary | ICD-10-CM | POA: Insufficient documentation

## 2016-08-31 MED ORDER — METHYLPREDNISOLONE ACETATE 40 MG/ML INJ SUSP (RADIOLOG
120.0000 mg | Freq: Once | INTRAMUSCULAR | Status: AC
  Administered 2016-08-31: 120 mg via EPIDURAL

## 2016-08-31 MED ORDER — IOPAMIDOL (ISOVUE-M 200) INJECTION 41%
1.0000 mL | Freq: Once | INTRAMUSCULAR | Status: AC
  Administered 2016-08-31: 1 mL via EPIDURAL

## 2016-08-31 NOTE — Discharge Instructions (Signed)

## 2016-09-02 ENCOUNTER — Telehealth: Payer: Self-pay | Admitting: Rheumatology

## 2016-09-02 MED ORDER — PREDNISONE 5 MG PO TABS: ORAL_TABLET | ORAL | 1 refills | Status: DC

## 2016-09-02 NOTE — Telephone Encounter (Signed)
Patient request refill on Prednisone 5mg . Patient uses [facility] on [address]. Please call patient when sent in.

## 2016-09-02 NOTE — Telephone Encounter (Signed)
Last Visit: 08/26/16 Next Visit: 01/26/17  Okay to refill per Dr. Deveshwar 

## 2016-09-07 ENCOUNTER — Encounter: Payer: Self-pay | Admitting: Rheumatology

## 2016-09-13 ENCOUNTER — Other Ambulatory Visit: Payer: Self-pay | Admitting: Rheumatology

## 2016-09-14 NOTE — Telephone Encounter (Signed)
Last Visit: 08/26/16 Next Visit: 01/26/17 Labs: 08/20/16 Stable  Okay to refill per Dr. Estanislado Pandy

## 2016-10-13 ENCOUNTER — Telehealth: Payer: Self-pay | Admitting: *Deleted

## 2016-10-13 NOTE — Telephone Encounter (Signed)
Bone Density Scan on 09/07/16 shows Osteopenia Repeat in 2 years   BMD 0.788

## 2016-11-05 ENCOUNTER — Other Ambulatory Visit: Payer: Self-pay | Admitting: *Deleted

## 2016-11-05 MED ORDER — PREDNISONE 1 MG PO TABS: 2.0000 mg | ORAL_TABLET | Freq: Every day | ORAL | 1 refills | Status: DC

## 2016-11-05 NOTE — Telephone Encounter (Signed)
Refill request received via fax for Prednisone  Last Visit: 08/26/16 Next Visit: 01/26/17  Okay to refill per Dr. Estanislado Pandy

## 2016-11-06 NOTE — Telephone Encounter (Signed)
Left message to advise patient of results. 

## 2016-11-10 ENCOUNTER — Telehealth (INDEPENDENT_AMBULATORY_CARE_PROVIDER_SITE_OTHER): Payer: Self-pay | Admitting: Orthopaedic Surgery

## 2016-11-10 ENCOUNTER — Other Ambulatory Visit (INDEPENDENT_AMBULATORY_CARE_PROVIDER_SITE_OTHER): Payer: Self-pay

## 2016-11-10 ENCOUNTER — Other Ambulatory Visit (INDEPENDENT_AMBULATORY_CARE_PROVIDER_SITE_OTHER): Payer: Self-pay | Admitting: Orthopaedic Surgery

## 2016-11-10 DIAGNOSIS — M5440 Lumbago with sciatica, unspecified side: Secondary | ICD-10-CM

## 2016-11-10 NOTE — Telephone Encounter (Signed)
Patient left a message stating she would like to speak to someone about scheduling another Epi injection in her back. Please call to discuss.

## 2016-11-10 NOTE — Telephone Encounter (Signed)
Yes, if he was the one to initially perform the injection

## 2016-11-10 NOTE — Telephone Encounter (Signed)
Can I refer her to Dr. Ernestina Patches?

## 2016-11-10 NOTE — Telephone Encounter (Signed)
Sent referral to GI and called [facility]

## 2016-11-11 ENCOUNTER — Other Ambulatory Visit: Payer: Self-pay | Admitting: *Deleted

## 2016-11-11 ENCOUNTER — Other Ambulatory Visit: Payer: Self-pay | Admitting: Radiology

## 2016-11-11 ENCOUNTER — Other Ambulatory Visit: Payer: Self-pay | Admitting: Ophthalmology

## 2016-11-11 DIAGNOSIS — M545 Low back pain, unspecified: Secondary | ICD-10-CM

## 2016-11-11 DIAGNOSIS — M1711 Unilateral primary osteoarthritis, right knee: Secondary | ICD-10-CM

## 2016-11-11 DIAGNOSIS — M1712 Unilateral primary osteoarthritis, left knee: Secondary | ICD-10-CM

## 2016-11-25 ENCOUNTER — Ambulatory Visit
Admission: RE | Admit: 2016-11-25 | Discharge: 2016-11-25 | Disposition: A | Discharge status: Home / Self Care | Payer: BC Managed Care – PPO | Source: Ambulatory Visit | Attending: Orthopaedic Surgery | Admitting: Orthopaedic Surgery

## 2016-11-25 ENCOUNTER — Other Ambulatory Visit (INDEPENDENT_AMBULATORY_CARE_PROVIDER_SITE_OTHER): Payer: Self-pay | Admitting: Orthopaedic Surgery

## 2016-11-25 DIAGNOSIS — M5440 Lumbago with sciatica, unspecified side: Secondary | ICD-10-CM

## 2016-11-25 MED ORDER — IOPAMIDOL (ISOVUE-M 200) INJECTION 41%
1.0000 mL | Freq: Once | INTRAMUSCULAR | Status: AC
  Administered 2016-11-25: 1 mL via EPIDURAL

## 2016-11-25 MED ORDER — METHYLPREDNISOLONE ACETATE 40 MG/ML INJ SUSP (RADIOLOG
120.0000 mg | Freq: Once | INTRAMUSCULAR | Status: AC
  Administered 2016-11-25: 120 mg via EPIDURAL

## 2016-11-25 NOTE — Discharge Instructions (Signed)

## 2016-11-27 ENCOUNTER — Other Ambulatory Visit (INDEPENDENT_AMBULATORY_CARE_PROVIDER_SITE_OTHER): Payer: Self-pay

## 2016-11-27 DIAGNOSIS — M5440 Lumbago with sciatica, unspecified side: Secondary | ICD-10-CM

## 2016-12-10 ENCOUNTER — Other Ambulatory Visit: Payer: Self-pay | Admitting: Rheumatology

## 2016-12-10 ENCOUNTER — Telehealth (INDEPENDENT_AMBULATORY_CARE_PROVIDER_SITE_OTHER): Payer: Self-pay | Admitting: Rheumatology

## 2016-12-10 NOTE — Telephone Encounter (Signed)
LAST OV NOTE & LABS FAXED TO [facility] @ [facility] [phone]

## 2016-12-10 NOTE — Telephone Encounter (Addendum)
Last Visit: 08/26/16 Next Visit: 01/26/17 Labs: 08/20/16 Stable  Patient will update labs on 12/14/16  Okay to refill 30 day supply per Dr. Estanislado Pandy

## 2016-12-14 ENCOUNTER — Other Ambulatory Visit: Payer: Self-pay

## 2016-12-14 DIAGNOSIS — M3320 Polymyositis, organ involvement unspecified: Secondary | ICD-10-CM

## 2016-12-14 DIAGNOSIS — E559 Vitamin D deficiency, unspecified: Secondary | ICD-10-CM

## 2016-12-14 DIAGNOSIS — Z79899 Other long term (current) drug therapy: Secondary | ICD-10-CM

## 2016-12-15 LAB — VITAMIN D 25 HYDROXY (VIT D DEFICIENCY, FRACTURES): Vit D, 25-Hydroxy: 51 ng/mL (ref 30–100)

## 2016-12-15 LAB — CBC WITH DIFFERENTIAL/PLATELET
BASOS ABS: 32 {cells}/uL (ref 0–200)
BASOS PCT: 0.5 %
EOS ABS: 13 {cells}/uL — AB (ref 15–500)
Eosinophils Relative: 0.2 %
HEMATOCRIT: 39.3 % (ref 35.0–45.0)
HEMOGLOBIN: 12.9 g/dL (ref 11.7–15.5)
Lymphs Abs: 1664 cells/uL (ref 850–3900)
MCH: 27.4 pg (ref 27.0–33.0)
MCHC: 32.8 g/dL (ref 32.0–36.0)
MCV: 83.4 fL (ref 80.0–100.0)
MPV: 9.8 fL (ref 7.5–12.5)
Monocytes Relative: 6.2 %
NEUTROS ABS: 4294 {cells}/uL (ref 1500–7800)
Neutrophils Relative %: 67.1 %
PLATELETS: 251 10*3/uL (ref 140–400)
RBC: 4.71 10*6/uL (ref 3.80–5.10)
RDW: 12.7 % (ref 11.0–15.0)
Total Lymphocyte: 26 %
WBC: 6.4 10*3/uL (ref 3.8–10.8)
WBCMIX: 397 {cells}/uL (ref 200–950)

## 2016-12-15 LAB — COMPLETE METABOLIC PANEL WITH GFR
AG RATIO: 1.2 (calc) (ref 1.0–2.5)
ALKALINE PHOSPHATASE (APISO): 71 U/L (ref 33–115)
ALT: 10 U/L (ref 6–29)
AST: 16 U/L (ref 10–35)
Albumin: 3.6 g/dL (ref 3.6–5.1)
BUN/Creatinine Ratio: 11 (calc) (ref 6–22)
BUN: 14 mg/dL (ref 7–25)
CALCIUM: 9 mg/dL (ref 8.6–10.2)
CO2: 29 mmol/L (ref 20–32)
CREATININE: 1.33 mg/dL — AB (ref 0.50–1.10)
Chloride: 104 mmol/L (ref 98–110)
GFR, EST NON AFRICAN AMERICAN: 47 mL/min/{1.73_m2} — AB (ref >=60)
GFR, Est African American: 55 mL/min/{1.73_m2} — ABNORMAL LOW (ref >=60)
GLOBULIN: 2.9 g/dL (ref 1.9–3.7)
Glucose, Bld: 95 mg/dL (ref 65–99)
POTASSIUM: 4.3 mmol/L (ref 3.5–5.3)
SODIUM: 139 mmol/L (ref 135–146)
Total Bilirubin: 0.5 mg/dL (ref 0.2–1.2)
Total Protein: 6.5 g/dL (ref 6.1–8.1)

## 2016-12-22 ENCOUNTER — Other Ambulatory Visit: Payer: Self-pay | Admitting: Rheumatology

## 2016-12-22 NOTE — Telephone Encounter (Signed)
Last Visit: 08/26/16 Next Visit: 01/26/17  Okay to refill per Dr. Estanislado Pandy

## 2017-01-12 HISTORY — PX: TOTAL SHOULDER ARTHROPLASTY: SHX126

## 2017-01-13 NOTE — Progress Notes (Deleted)
Office Visit Note  Patient: Connie Brady             Date of Birth: 03/03/1970           MRN: [id number]             PCP: Kelton Pillar, MD Referring: Kelton Pillar, MD Visit Date: 01/26/2017 Occupation: @GUAROCC @    Subjective:  No chief complaint on file.   History of Present Illness: Connie Brady is a 46 y.o. female ***   Activities of Daily Living:  Patient reports morning stiffness for *** {minute/hour:19697}.   Patient {ACTIONS;DENIES/REPORTS:21021675::"Denies"} nocturnal pain.  Difficulty dressing/grooming: {ACTIONS;DENIES/REPORTS:21021675::"Denies"} Difficulty climbing stairs: {ACTIONS;DENIES/REPORTS:21021675::"Denies"} Difficulty getting out of chair: {ACTIONS;DENIES/REPORTS:21021675::"Denies"} Difficulty using hands for taps, buttons, cutlery, and/or writing: {ACTIONS;DENIES/REPORTS:21021675::"Denies"}   No Rheumatology ROS completed.   PMFS History:  Patient Active Problem List   Diagnosis Date Noted  . Abnormal cervical Papanicolaou smear 08/31/2016  . Obesity 08/31/2016  . Premenstrual symptom 08/31/2016  . Vitamin D deficiency 08/25/2016  . History of chronic kidney disease 05/07/2016  . Chronic kidney disease (CKD), stage III (moderate) ([place]) 03/30/2016  . High risk medication use 01/10/2016  . Primary osteoarthritis of both hips 01/10/2016  . Primary osteoarthritis of both knees 01/10/2016  . Primary osteoarthritis of both feet 01/10/2016  . Bilateral plantar fasciitis 01/10/2016  . Chest pain 01/26/2013  . 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 Sjoegren syndrome [facility])     Past Medical History:  Diagnosis Date  . Anemia   . Polymyositis ([place])   . Sjoegren syndrome ([place])     Family History  Problem Relation Age of Onset  . Hypertension Sister   . Hypertension Brother   . Diabetes Brother   . Hypertension Sister   . Hypertension Sister   . Hypertension Brother    Past Surgical History:  Procedure Laterality Date  . BUNIONECTOMY  Bilateral   . KNEE SURGERY     right  . MUSCLE BIOPSY     r/thigh  . PLANTAR FASCIA SURGERY Right    Social History   Social History Narrative  . Not on file     Objective: Vital Signs: There were no vitals taken for this visit.   Physical Exam   Musculoskeletal Exam: ***  CDAI Exam: No CDAI exam completed.    Investigation: No additional findings. CBC Latest Ref Rng & Units 12/14/2016 08/20/2016 04/23/2016  WBC 3.8 - 10.8 Thousand/uL 6.4 6.6 8.1  Hemoglobin 11.7 - 15.5 g/dL 12.9 12.0 12.5  Hematocrit 35.0 - 45.0 % 39.3 36.9 38.6  Platelets 140 - 400 Thousand/uL 251 219 231   CMP Latest Ref Rng & Units 12/14/2016 08/20/2016 04/23/2016  Glucose 65 - 99 mg/dL 95 95 54(L)  BUN 7 - 25 mg/dL 14 13 12   Creatinine 0.50 - 1.10 mg/dL 1.33(H) 1.26(H) 1.37(H)  Sodium 135 - 146 mmol/L 139 138 138  Potassium 3.5 - 5.3 mmol/L 4.3 4.4 5.1  Chloride 98 - 110 mmol/L 104 106 103  CO2 20 - 32 mmol/L 29 22 25   Calcium 8.6 - 10.2 mg/dL 9.0 8.7 8.9  Total Protein 6.1 - 8.1 g/dL 6.5 6.3 6.3  Total Bilirubin 0.2 - 1.2 mg/dL 0.5 0.3 0.5  Alkaline Phos 33 - 115 U/L - 57 51  AST 10 - 35 U/L 16 12 16   ALT 6 - 29 U/L 10 7 10    Imaging: No results found.  Speciality Comments: No specialty comments available.    Procedures:  No procedures performed Allergies:  Lyrica [pregabalin] and Percocet [oxycodone-acetaminophen]   Assessment / Plan:     Visit Diagnoses: No diagnosis found.    Orders: No orders of the defined types were placed in this encounter.  No orders of the defined types were placed in this encounter.   Face-to-face time spent with patient was *** minutes. 50% of time was spent in counseling and coordination of care.  Follow-Up Instructions: No Follow-up on file.   Earnestine Mealing, CMA  Note - This record has been created using [employer].  Chart creation errors have been sought, but may not always  have been located. Such creation errors do not reflect on  the  standard of medical care.

## 2017-01-20 ENCOUNTER — Other Ambulatory Visit: Payer: Self-pay | Admitting: Rheumatology

## 2017-01-20 NOTE — Telephone Encounter (Signed)
Last Visit: 08/26/16 Next Visit: 01/26/17 Labs: 12/14/16 Stable   Okay to refill per Dr. Estanislado Pandy

## 2017-01-26 ENCOUNTER — Ambulatory Visit: Payer: BC Managed Care – PPO | Admitting: Rheumatology

## 2017-01-28 NOTE — Progress Notes (Signed)
Office Visit Note  Patient: Connie Brady             Date of Birth: [birth year]           MRN: [id number]             PCP: Kelton Pillar, MD Referring: Kelton Pillar, MD Visit Date: 01/29/2017 Occupation: @GUAROCC @    Subjective:  Left shoulder pain     History of Present Illness: Connie Brady is a 46 y.o. female with history of polymyositis, osteoarthritis, and sjogren's syndrome.  Patient denies any muscle weakness or tenderness at this time.  She continues to have pain in her right hamstring, which has been an ongoing issue for years.  She states she has also started to experience sciatica symptoms on the right side that go down the back of her right thigh. Patient states she is currently on Prednisone 5 mg and continues to taper.  She states she did not notice any difference after going from 6 mg to 5 mg. She continues to take Imuran, which she feels is managing her symptoms well.  She continues to have bilateral plantar fasciitis. She uses a roller, which helps but has not been using it often.   Patient states she continues to have symptoms of dry mouth.  She states she takes Pilocarpine once daily.  Denies any eye dryness.     Patient reports she had surgery for left knee meniscal tear about 1 month ago with Dr. Theda Sers.  She is recovering well and has full ROM back.  She also had surgery on her left shoulder for bursitis and arthritis with Dr. Theda Sers.  She is still going to physical therapy and being followed by Dr. Theda Sers.   She states that she was started on Lopreeza by her gynecologist.  She discontinued due to risk of cancer.          Activities of Daily Living:  Patient reports morning stiffness for 0 minutes.   Patient Reports nocturnal pain.  Difficulty dressing/grooming: Reports Difficulty climbing stairs: Reports Difficulty getting out of chair: Reports Difficulty using hands for taps, buttons, cutlery, and/or writing: Denies   Review of Systems    Constitutional: Negative for fatigue and weakness.  HENT: Positive for mouth dryness. Negative for mouth sores and nose dryness.   Eyes: Negative for redness and dryness.  Respiratory: Positive for cough (Recent URI). Negative for hemoptysis, shortness of breath and difficulty breathing.   Cardiovascular: Negative.  Negative for chest pain, palpitations, hypertension, irregular heartbeat and swelling in legs/feet.  Gastrointestinal: Negative.  Negative for blood in stool, constipation and diarrhea.  Endocrine: Negative for increased urination.  Genitourinary: Negative for painful urination.  Musculoskeletal: Positive for muscle tenderness (Right hamstring). Negative for arthralgias, joint pain, joint swelling, myalgias, muscle weakness, morning stiffness and myalgias.  Skin: Negative for color change, pallor, rash, hair loss, nodules/bumps, redness, skin tightness, ulcers and sensitivity to sunlight.  Neurological: Negative for dizziness, numbness and headaches.  Hematological: Negative for swollen glands.  Psychiatric/Behavioral: Positive for depressed mood and sleep disturbance. The patient is not nervous/anxious.     PMFS History:  Patient Active Problem List   Diagnosis Date Noted  . Abnormal cervical Papanicolaou smear 08/31/2016  . Obesity 08/31/2016  . Premenstrual symptom 08/31/2016  . Vitamin D deficiency 08/25/2016  . History of chronic kidney disease 05/07/2016  . Chronic kidney disease (CKD), stage III (moderate) ([place]) 03/30/2016  . High risk medication use 01/10/2016  . Primary osteoarthritis of both  hips 01/10/2016  . Primary osteoarthritis of both knees 01/10/2016  . Primary osteoarthritis of both feet 01/10/2016  . Bilateral plantar fasciitis 01/10/2016  . Chest pain 01/26/2013  . 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 Sjoegren syndrome [facility])     Past Medical History:  Diagnosis Date  . Anemia   . Polymyositis ([place])   . Sjoegren syndrome ([place])     Family History   Problem Relation Age of Onset  . Hypertension Sister   . Hypertension Brother   . Diabetes Brother   . Hypertension Sister   . Hypertension Sister   . Hypertension Brother   . Diabetes Brother    Past Surgical History:  Procedure Laterality Date  . BUNIONECTOMY Bilateral   . KNEE ARTHROPLASTY Left 2018   meniscal tear  . KNEE SURGERY     right  . MUSCLE BIOPSY     r/thigh  . PLANTAR FASCIA SURGERY Right   . TOTAL SHOULDER ARTHROPLASTY Left 01/12/2017   Social History   Social History Narrative  . Not on file     Objective: Vital Signs: BP 118/72 (BP Location: Right Arm, Patient Position: Sitting, Cuff Size: Normal)   Pulse 93   Resp 16   Ht 5\' 8"  (1.727 m)   Wt 208 lb (94.3 kg)   BMI 31.63 kg/m    Physical Exam  Constitutional: She is oriented to person, place, and time. She appears well-developed and well-nourished.  HENT:  Head: Normocephalic and atraumatic.  Eyes: Conjunctivae and EOM are normal.  Neck: Normal range of motion.  Cardiovascular: Normal rate, regular rhythm, normal heart sounds and intact distal pulses.  Pulmonary/Chest: Effort normal and breath sounds normal.  Abdominal: Soft. Bowel sounds are normal.  Lymphadenopathy:    She has no cervical adenopathy.  Neurological: She is alert and oriented to person, place, and time.  Skin: Skin is warm and dry. Capillary refill takes less than 2 seconds.  Psychiatric: She has a normal mood and affect. Her behavior is normal.  Nursing note and vitals reviewed.    Musculoskeletal Exam: C-spine, thoracic, and lumbar spine good ROM. Shoulder joints, elbow joints, wrist joints, MCPs, PIPs, and DIPs good ROM with no synovitis.  Hip joints, knee joints, and ankle joints good ROM.  MTPs, PIPs, and DIPs good ROM with no synovitis.  No trochanteric bursitis.    CDAI Exam: No CDAI exam completed.    Investigation: No additional findings. CBC Latest Ref Rng & Units 12/14/2016 08/20/2016 04/23/2016  WBC 3.8 -  10.8 Thousand/uL 6.4 6.6 8.1  Hemoglobin 11.7 - 15.5 g/dL 12.9 12.0 12.5  Hematocrit 35.0 - 45.0 % 39.3 36.9 38.6  Platelets 140 - 400 Thousand/uL 251 219 231   CMP Latest Ref Rng & Units 12/14/2016 08/20/2016 04/23/2016  Glucose 65 - 99 mg/dL 95 95 54(L)  BUN 7 - 25 mg/dL 14 13 12   Creatinine 0.50 - 1.10 mg/dL 1.33(H) 1.26(H) 1.37(H)  Sodium 135 - 146 mmol/L 139 138 138  Potassium 3.5 - 5.3 mmol/L 4.3 4.4 5.1  Chloride 98 - 110 mmol/L 104 106 103  CO2 20 - 32 mmol/L 29 22 25   Calcium 8.6 - 10.2 mg/dL 9.0 8.7 8.9  Total Protein 6.1 - 8.1 g/dL 6.5 6.3 6.3  Total Bilirubin 0.2 - 1.2 mg/dL 0.5 0.3 0.5  Alkaline Phos 33 - 115 U/L - 57 51  AST 10 - 35 U/L 16 12 16   ALT 6 - 29 U/L 10 7 10     Imaging: No results  found.  Speciality Comments: No specialty comments available.    Procedures:  No procedures performed Allergies: Lyrica [pregabalin] and Percocet [oxycodone-acetaminophen]   Assessment / Plan:     Visit Diagnoses: Polymyositis ([place]) - History of elevated CK, positive muscle biopsy, myositis panel negative, initial CK 522 -her most recent CK is within normal limits.. Patient is clinically doing very well. CK level will be checked with next labs in February.  She will continue on Imuran and Prednisone 5 mg.- Plan: CK  High risk medication use - Imuran 50 mg by mouth daily, prednisone 7 mg by mouth every morning: CBC and CMP standing orders in place for February and every 3 months following.  Creatinine elevated previously but remains stable.  Will continue to monitor.   Sjogren's syndrome with keratoconjunctivitis sicca ([facility]) - ANA 1:40 centromere, +SSA. On pilocarpine 5 mg by mouth 3 times a day when necessary.  Patient is only taking Pilocarpine 5 mg once daily.  She is going to increase to Pilocarpine 5 mg 3 times daily because she continues to have mouth dryness.  Denies any eye dryness.   Primary osteoarthritis of both hips: No pain.  Good ROM on exam.    Primary  osteoarthritis of both knees:  Doing well.  Patient had left knee meniscal tear repair with Dr. Theda Sers about 1 month ago and is doing great.  She has full ROM and no discomfort or mechanical symptoms.    Primary osteoarthritis of both feet: No discomfort or synovitis on exam.    Bilateral plantar fasciitis: Continues to cause pain.  Discussed stretching exercises and proper fitting shoes.  She is going to start using her roller more frequently.    DDD (degenerative disc disease), lumbar: No discomfort at this time.    On prednisone therapy: 5 mg daily.  She noticed no difference after decreasing from 6 mg daily.   History of vitamin D deficiency: She continues to take vitamin D supplements.    History of chronic kidney disease - Followed up by Dr. Moshe Cipro    Orders: Orders Placed This Encounter  Procedures  . CK   No orders of the defined types were placed in this encounter.    Follow-Up Instructions: Return in about 5 months (around 06/29/2017) for Polymyositis.   Note - This record has been created using [employer].  Chart creation errors have been sought, but may not always  have been located. Such creation errors do not reflect on  the standard of medical care.

## 2017-01-29 ENCOUNTER — Encounter: Payer: Self-pay | Admitting: Rheumatology

## 2017-01-29 ENCOUNTER — Ambulatory Visit: Payer: BC Managed Care – PPO | Admitting: Rheumatology

## 2017-01-29 VITALS — BP 118/72 | HR 93 | Resp 16 | Ht 68.0 in | Wt 208.0 lb

## 2017-01-29 DIAGNOSIS — Z79899 Other long term (current) drug therapy: Secondary | ICD-10-CM

## 2017-01-29 DIAGNOSIS — M19071 Primary osteoarthritis, right ankle and foot: Secondary | ICD-10-CM | POA: Diagnosis not present

## 2017-01-29 DIAGNOSIS — M3320 Polymyositis, organ involvement unspecified: Secondary | ICD-10-CM

## 2017-01-29 DIAGNOSIS — M19072 Primary osteoarthritis, left ankle and foot: Secondary | ICD-10-CM | POA: Diagnosis not present

## 2017-01-29 DIAGNOSIS — Z7952 Long term (current) use of systemic steroids: Secondary | ICD-10-CM

## 2017-01-29 DIAGNOSIS — Z87448 Personal history of other diseases of urinary system: Secondary | ICD-10-CM

## 2017-01-29 DIAGNOSIS — M170 Bilateral primary osteoarthritis of knee: Secondary | ICD-10-CM

## 2017-01-29 DIAGNOSIS — M3501 Sicca syndrome with keratoconjunctivitis: Secondary | ICD-10-CM

## 2017-01-29 DIAGNOSIS — Z8639 Personal history of other endocrine, nutritional and metabolic disease: Secondary | ICD-10-CM | POA: Diagnosis not present

## 2017-01-29 DIAGNOSIS — M722 Plantar fascial fibromatosis: Secondary | ICD-10-CM | POA: Diagnosis not present

## 2017-01-29 DIAGNOSIS — M5136 Other intervertebral disc degeneration, lumbar region: Secondary | ICD-10-CM

## 2017-01-29 DIAGNOSIS — M160 Bilateral primary osteoarthritis of hip: Secondary | ICD-10-CM | POA: Diagnosis not present

## 2017-01-29 NOTE — Patient Instructions (Signed)
Standing Labs We placed an order today for your standing lab work.    Please come back and get your standing labs in February and every 3 months following. Labs: CK, CBC, and CMP   We have open lab Monday through Friday from 8:30-11:30 AM and 1:30-4 PM at the office of Dr. Bo Merino.   The office is located at [address], [place], [place], [place] [address] No appointment is necessary.   Labs are drawn by [facility].  You may receive a bill from [facility] for your lab work. If you have any questions regarding directions or hours of operation,  please call [phone].

## 2017-02-16 ENCOUNTER — Other Ambulatory Visit: Payer: Self-pay | Admitting: Rheumatology

## 2017-02-17 NOTE — Telephone Encounter (Signed)
Last Visit: 01/29/17 Next Visit: 07/06/17 Labs: 12/14/16 stable  Okay to refill per Dr. Estanislado Pandy

## 2017-03-24 ENCOUNTER — Other Ambulatory Visit: Payer: Self-pay | Admitting: Rheumatology

## 2017-03-25 ENCOUNTER — Other Ambulatory Visit: Payer: Self-pay

## 2017-03-25 DIAGNOSIS — Z79899 Other long term (current) drug therapy: Secondary | ICD-10-CM

## 2017-03-25 DIAGNOSIS — M3320 Polymyositis, organ involvement unspecified: Secondary | ICD-10-CM

## 2017-03-25 LAB — COMPLETE METABOLIC PANEL WITH GFR
AG RATIO: 1.3 (calc) (ref 1.0–2.5)
ALT: 8 U/L (ref 6–29)
AST: 14 U/L (ref 10–35)
Albumin: 3.6 g/dL (ref 3.6–5.1)
Alkaline phosphatase (APISO): 59 U/L (ref 33–115)
BILIRUBIN TOTAL: 0.3 mg/dL (ref 0.2–1.2)
BUN/Creatinine Ratio: 10 (calc) (ref 6–22)
BUN: 12 mg/dL (ref 7–25)
CHLORIDE: 108 mmol/L (ref 98–110)
CO2: 23 mmol/L (ref 20–32)
Calcium: 8.9 mg/dL (ref 8.6–10.2)
Creat: 1.18 mg/dL — ABNORMAL HIGH (ref 0.50–1.10)
GFR, EST AFRICAN AMERICAN: 63 mL/min/{1.73_m2} (ref >=60)
GFR, Est Non African American: 54 mL/min/{1.73_m2} — ABNORMAL LOW (ref >=60)
GLOBULIN: 2.7 g/dL (ref 1.9–3.7)
Glucose, Bld: 93 mg/dL (ref 65–99)
POTASSIUM: 4.4 mmol/L (ref 3.5–5.3)
SODIUM: 139 mmol/L (ref 135–146)
TOTAL PROTEIN: 6.3 g/dL (ref 6.1–8.1)

## 2017-03-25 LAB — CBC WITH DIFFERENTIAL/PLATELET
BASOS ABS: 21 {cells}/uL (ref 0–200)
Basophils Relative: 0.3 %
EOS ABS: 0 {cells}/uL — AB (ref 15–500)
Eosinophils Relative: 0 %
HCT: 35.2 % (ref 35.0–45.0)
Hemoglobin: 11.5 g/dL — ABNORMAL LOW (ref 11.7–15.5)
Lymphs Abs: 1511 cells/uL (ref 850–3900)
MCH: 27.5 pg (ref 27.0–33.0)
MCHC: 32.7 g/dL (ref 32.0–36.0)
MCV: 84.2 fL (ref 80.0–100.0)
MONOS PCT: 6.7 %
MPV: 9.9 fL (ref 7.5–12.5)
NEUTROS PCT: 71.1 %
Neutro Abs: 4906 cells/uL (ref 1500–7800)
PLATELETS: 232 10*3/uL (ref 140–400)
RBC: 4.18 10*6/uL (ref 3.80–5.10)
RDW: 12.4 % (ref 11.0–15.0)
TOTAL LYMPHOCYTE: 21.9 %
WBC mixed population: 462 cells/uL (ref 200–950)
WBC: 6.9 10*3/uL (ref 3.8–10.8)

## 2017-03-25 LAB — CK: Total CK: 193 U/L — ABNORMAL HIGH (ref 29–143)

## 2017-03-25 NOTE — Telephone Encounter (Signed)
Last visit: 01/29/2017 Next visit: 07/06/2017 Labs: 03/25/2017  Okay to refill per Dr. Estanislado Pandy.

## 2017-03-26 NOTE — Progress Notes (Signed)
CK is mildly elevated.  Discussed with Dr. Estanislado Pandy.  She will need to return in 2 weeks to check her Ck.  Elevation is likely due related to level of activity.  She was clinically stable at her last visit.

## 2017-04-08 ENCOUNTER — Other Ambulatory Visit: Payer: Self-pay

## 2017-04-08 DIAGNOSIS — M3320 Polymyositis, organ involvement unspecified: Secondary | ICD-10-CM

## 2017-04-09 LAB — CK: Total CK: 225 U/L — ABNORMAL HIGH (ref 29–143)

## 2017-04-12 ENCOUNTER — Other Ambulatory Visit: Payer: Self-pay | Admitting: Physician Assistant

## 2017-04-12 ENCOUNTER — Telehealth: Payer: Self-pay | Admitting: Physician Assistant

## 2017-04-12 MED ORDER — AZATHIOPRINE 50 MG PO TABS: 50.0000 mg | ORAL_TABLET | Freq: Two times a day (BID) | ORAL | 0 refills | Status: DC

## 2017-04-12 MED ORDER — PREDNISONE 1 MG PO TABS: 1.0000 mg | ORAL_TABLET | Freq: Every day | ORAL | 0 refills | Status: DC

## 2017-04-12 NOTE — Telephone Encounter (Signed)
I called patient to discuss CK lab result.  She CK is trending up.  She continues to have lower extremity muscle tenderness but no weakness.  She has been working out more on a regular basis.  She was advised to take Prednisone 7 mg by mouth daily and Imuran 100 mg daily.  Refills of Prednisone 1 mg and Imuran 50 mg were sent to the pharmacy.  She will return in 2 weeks to recheck her CK.   If her CK level trends down we will decrease her Prednisone back down to 5 mg daily. All questions were addressed.

## 2017-04-22 ENCOUNTER — Other Ambulatory Visit: Payer: Self-pay

## 2017-04-22 DIAGNOSIS — M3320 Polymyositis, organ involvement unspecified: Secondary | ICD-10-CM

## 2017-04-22 LAB — CK: CK TOTAL: 184 U/L — AB (ref 29–143)

## 2017-04-23 NOTE — Progress Notes (Signed)
CK is trending down.  Please advise patient to continue on Prednisone 7 mg daily.  We will recheck CK in 3 months.

## 2017-06-15 ENCOUNTER — Telehealth: Payer: Self-pay | Admitting: Rheumatology

## 2017-06-15 ENCOUNTER — Other Ambulatory Visit: Payer: Self-pay | Admitting: Rheumatology

## 2017-06-15 MED ORDER — GABAPENTIN 300 MG PO CAPS: 300.0000 mg | ORAL_CAPSULE | Freq: Two times a day (BID) | ORAL | 0 refills | Status: DC

## 2017-06-15 MED ORDER — PILOCARPINE HCL 5 MG PO TABS: ORAL_TABLET | ORAL | 0 refills | Status: DC

## 2017-06-15 MED ORDER — PREDNISONE 5 MG PO TABS: ORAL_TABLET | ORAL | 0 refills | Status: DC

## 2017-06-15 NOTE — Telephone Encounter (Signed)
Patient called requesting prescription refills on Pilocarpine, Prednisone 5 mg, and Gabapentin.  Patient's pharmacy is  [facility] on [address].

## 2017-06-15 NOTE — Telephone Encounter (Signed)
Last Visit: 03/25/17 Next visit: 06/28/17  Okay to refill per Dr. Estanislado Pandy

## 2017-06-15 NOTE — Progress Notes (Signed)
Office Visit Note  Patient: Connie Brady             Date of Birth: August 20, 1970           MRN: [id number]             PCP: Kelton Pillar, MD Referring: Kelton Pillar, MD Visit Date: 06/28/2017 Occupation: @GUAROCC @    Subjective:  Left knee pain    History of Present Illness: Connie Brady is a 47 y.o. female with history of polymyositis, Sjogren's syndrome, osteoarthritis, and DDD.  Patient states she continues to have chronic pain in her left knee joint.  She had a cortisone injection 3 weeks ago at [facility], which provided relief until her left knee buckled last week.  She reports swelling in her left knee at this time.  She reports lower extremity weakness and difficulty getting up from a chair.  She continues to have tenderness and tension in left hamstring.  She is also having upper and lower extremity weakness.  She reports that her hip joints are doing well.  She has chronic lower back pain but denies any symptoms of sciatica.  Her plantar fasciitis bilaterally is improved.  She continues to take Imuran daily and prednisone 7 mg daily. She has chronic dry mouth and intermittent dry eyes.  She uses pilocarpine as needed.  She goes to the dentist on a regular basis and denies any recent dental caries.  Activities of Daily Living:  Patient reports morning stiffness for 24 hours.   Patient Reports nocturnal pain.  Difficulty dressing/grooming: Denies Difficulty climbing stairs: Reports Difficulty getting out of chair: Reports Difficulty using hands for taps, buttons, cutlery, and/or writing: Denies   Review of Systems  Constitutional: Positive for fatigue.  HENT: Negative for mouth sores, trouble swallowing, trouble swallowing, mouth dryness and nose dryness.   Eyes: Negative for pain, visual disturbance and dryness.  Respiratory: Negative for cough, hemoptysis, shortness of breath and difficulty breathing.   Cardiovascular: Positive for swelling in  legs/feet. Negative for chest pain, palpitations and hypertension.  Gastrointestinal: Negative for blood in stool, constipation and diarrhea.  Endocrine: Negative for increased urination.  Genitourinary: Negative for difficulty urinating and painful urination.  Musculoskeletal: Positive for arthralgias, joint pain, joint swelling, muscle weakness, morning stiffness and muscle tenderness. Negative for myalgias and myalgias.  Skin: Positive for hair loss. Negative for color change, pallor, rash, nodules/bumps, skin tightness, ulcers and sensitivity to sunlight.  Allergic/Immunologic: Negative for susceptible to infections.  Neurological: Negative for dizziness, numbness, headaches and weakness.  Hematological: Negative for swollen glands.  Psychiatric/Behavioral: Positive for sleep disturbance. Negative for depressed mood. The patient is not nervous/anxious.     PMFS History:  Patient Active Problem List   Diagnosis Date Noted  . Abnormal cervical Papanicolaou smear 08/31/2016  . Obesity 08/31/2016  . Premenstrual symptom 08/31/2016  . Vitamin D deficiency 08/25/2016  . History of chronic kidney disease 05/07/2016  . Chronic kidney disease (CKD), stage III (moderate) ([place]) 03/30/2016  . High risk medication use 01/10/2016  . Primary osteoarthritis of both hips 01/10/2016  . Primary osteoarthritis of both knees 01/10/2016  . Primary osteoarthritis of both feet 01/10/2016  . Bilateral plantar fasciitis 01/10/2016  . Chest pain 01/26/2013  . Polymyositis ([place]) 01/26/2013  . Sjoegren syndrome     Past Medical History:  Diagnosis Date  . Anemia   . Polymyositis ([place])   . Sjoegren syndrome     Family History  Problem Relation Age of Onset  .  Hypertension Sister   . Hypertension Brother   . Diabetes Brother   . Hypertension Sister   . Hypertension Sister   . Hypertension Brother   . Diabetes Brother    Past Surgical History:  Procedure Laterality Date  . BUNIONECTOMY Bilateral    . KNEE ARTHROPLASTY Left 2018   meniscal tear  . KNEE SURGERY     right  . MUSCLE BIOPSY     r/thigh  . PLANTAR FASCIA SURGERY Right   . TOTAL SHOULDER ARTHROPLASTY Left 01/12/2017   Social History   Social History Narrative  . Not on file     Objective: Vital Signs: BP 106/69 (BP Location: Left Arm, Patient Position: Sitting, Cuff Size: Normal)   Pulse 78   Resp 14   Ht 5\' 8"  (1.727 m)   Wt 213 lb (96.6 kg)   LMP 06/25/2017   BMI 32.39 kg/m    Physical Exam  Constitutional: She is oriented to person, place, and time. She appears well-developed and well-nourished.  HENT:  Head: Normocephalic and atraumatic.  Eyes: Conjunctivae and EOM are normal.  Neck: Normal range of motion.  Cardiovascular: Normal rate, regular rhythm, normal heart sounds and intact distal pulses.  Pulmonary/Chest: Effort normal and breath sounds normal.  Abdominal: Soft. Bowel sounds are normal.  Lymphadenopathy:    She has no cervical adenopathy.  Neurological: She is alert and oriented to person, place, and time.  Skin: Skin is warm and dry. Capillary refill takes less than 2 seconds.  Psychiatric: She has a normal mood and affect. Her behavior is normal.  Nursing note and vitals reviewed.    Musculoskeletal Exam: C-spine, thoracic spine, lumbar spine good ROM.  Mild midline spinal tenderness in lumbar region.  SI joint tenderness bilaterally.  Shoulder joints, elbow joints, wrist joints, MCPs, PIPs, and DIPs good ROM with no synovitis.  Hip joints good ROM with some discomfort.  Tenderness of bilateral trochanteric bursa.  Knee joints, ankle joints, MTPs, PIPs, and DIPs good ROM with no synovitis.  She has small effusion and warmth of left knee joint.   CDAI Exam: No CDAI exam completed.    Investigation: No additional findings. CBC Latest Ref Rng & Units 03/25/2017 12/14/2016 08/20/2016  WBC 3.8 - 10.8 Thousand/uL 6.9 6.4 6.6  Hemoglobin 11.7 - 15.5 g/dL 11.5(L) 12.9 12.0  Hematocrit 35.0 -  45.0 % 35.2 39.3 36.9  Platelets 140 - 400 Thousand/uL 232 251 219   CMP Latest Ref Rng & Units 03/25/2017 12/14/2016 08/20/2016  Glucose 65 - 99 mg/dL 93 95 95  BUN 7 - 25 mg/dL 12 14 13   Creatinine 0.50 - 1.10 mg/dL 1.18(H) 1.33(H) 1.26(H)  Sodium 135 - 146 mmol/L 139 139 138  Potassium 3.5 - 5.3 mmol/L 4.4 4.3 4.4  Chloride 98 - 110 mmol/L 108 104 106  CO2 20 - 32 mmol/L 23 29 22   Calcium 8.6 - 10.2 mg/dL 8.9 9.0 8.7  Total Protein 6.1 - 8.1 g/dL 6.3 6.5 6.3  Total Bilirubin 0.2 - 1.2 mg/dL 0.3 0.5 0.3  Alkaline Phos 33 - 115 U/L - - 57  AST 10 - 35 U/L 14 16 12   ALT 6 - 29 U/L 8 10 7     Imaging: No results found.  Speciality Comments: No specialty comments available.    Procedures:  No procedures performed Allergies: Lyrica [pregabalin] and Percocet [oxycodone-acetaminophen]   Assessment / Plan:     Visit Diagnoses: Polymyositis ([place]) - History of elevated CK, positive muscle biopsy, myositis panel  negative, initial CK 522.  She currently is taking Imuran 1 tablet by mouth twice daily and prednisone 7 mg daily.  She is having lower extremity muscle weakness and difficulty getting up from a chair.  She also has muscle tenderness in her upper and lower extremities.  A referral will be placed to physical therapy to work on lower extremity strengthening.  Her last CK was checked on 04/22/2017 and it was 184.  We will recheck a CK level today.  She will continue on her current treatment regimen.- Plan: CK  High risk medication use - Imuran, prednisone  -CBC and CMP will be drawn today to monitor for drug toxicity. Plan: CBC with Differential/Platelet, COMPLETE METABOLIC PANEL WITH GFR  Sjogren's syndrome with keratoconjunctivitis sicca (HCC) - ANA 1:40 centromere, +SSA.  She continues to have symptoms of dry mouth and intermittent eye dryness.  She takes pilocarpine 5 mg by mouth up to 3 times daily when necessary.  She sees her dentist on a regular basis.  She has not had any recent  dental caries.  She has no parotid swelling on exam today.  CBC, CMP, UA were ordered today.- Plan: CBC with Differential/Platelet, COMPLETE METABOLIC PANEL WITH GFR, Urinalysis, Routine w reflex microscopic  Primary osteoarthritis of both hips: She has good range of motion with some discomfort of her hip joints.  Primary osteoarthritis of both knees: She has a small left knee effusion with warmth.  No warmth or effusion of right knee.  She has good range of motion of bilateral knees.  She had a left knee cortisone injection 3 weeks ago at [place].  Primary osteoarthritis of both feet: No discomfort at this time.  She was proper fitting shoes.  Bilateral plantar fasciitis: Resolved.  DDD (degenerative disc disease), lumbar: Chronic pain.  She has midline spinal tenderness in the lumbar region.  On prednisone therapy: She is currently on prednisone 7 mg by mouth daily.  Other medical conditions are listed as follows:  History of chronic kidney disease - Followed up by Dr. Moshe Cipro   History of vitamin D deficiency    Orders: Orders Placed This Encounter  Procedures  . CBC with Differential/Platelet  . COMPLETE METABOLIC PANEL WITH GFR  . CK  . Urinalysis, Routine w reflex microscopic   No orders of the defined types were placed in this encounter.   Face-to-face time spent with patient was 30 minutes. >50% of time was spent in counseling and coordination of care.  Follow-Up Instructions: Return in about 3 months (around 09/28/2017) for Polymyositis, Sjogren's syndrome, Osteoarthritis.   Connie Neas, PA-C I examined and evaluated the patient with Connie Sams PA.  Patient continues to experience some lower extremity weakness.  We will check her CK today.  And will refer her to physical therapy for lower extremity muscle strengthening.  The plan of care was discussed as noted above.  Bo Merino, MD Note - This record has been created using Radio producer.  Chart creation errors have been sought, but may not always  have been located. Such creation errors do not reflect on  the standard of medical care.

## 2017-06-28 ENCOUNTER — Ambulatory Visit: Payer: BC Managed Care – PPO | Admitting: Rheumatology

## 2017-06-28 ENCOUNTER — Encounter: Payer: Self-pay | Admitting: Rheumatology

## 2017-06-28 ENCOUNTER — Telehealth: Payer: Self-pay | Admitting: Rheumatology

## 2017-06-28 VITALS — BP 106/69 | HR 78 | Resp 14 | Ht 68.0 in | Wt 213.0 lb

## 2017-06-28 DIAGNOSIS — Z8639 Personal history of other endocrine, nutritional and metabolic disease: Secondary | ICD-10-CM

## 2017-06-28 DIAGNOSIS — M170 Bilateral primary osteoarthritis of knee: Secondary | ICD-10-CM

## 2017-06-28 DIAGNOSIS — M3320 Polymyositis, organ involvement unspecified: Secondary | ICD-10-CM | POA: Diagnosis not present

## 2017-06-28 DIAGNOSIS — Z79899 Other long term (current) drug therapy: Secondary | ICD-10-CM

## 2017-06-28 DIAGNOSIS — M19071 Primary osteoarthritis, right ankle and foot: Secondary | ICD-10-CM | POA: Diagnosis not present

## 2017-06-28 DIAGNOSIS — M722 Plantar fascial fibromatosis: Secondary | ICD-10-CM | POA: Diagnosis not present

## 2017-06-28 DIAGNOSIS — Z87448 Personal history of other diseases of urinary system: Secondary | ICD-10-CM

## 2017-06-28 DIAGNOSIS — M51369 Other intervertebral disc degeneration, lumbar region without mention of lumbar back pain or lower extremity pain: Secondary | ICD-10-CM

## 2017-06-28 DIAGNOSIS — Z7952 Long term (current) use of systemic steroids: Secondary | ICD-10-CM | POA: Diagnosis not present

## 2017-06-28 DIAGNOSIS — M5136 Other intervertebral disc degeneration, lumbar region: Secondary | ICD-10-CM

## 2017-06-28 DIAGNOSIS — M160 Bilateral primary osteoarthritis of hip: Secondary | ICD-10-CM | POA: Diagnosis not present

## 2017-06-28 DIAGNOSIS — M19072 Primary osteoarthritis, left ankle and foot: Secondary | ICD-10-CM

## 2017-06-28 DIAGNOSIS — M3501 Sicca syndrome with keratoconjunctivitis: Secondary | ICD-10-CM

## 2017-06-28 NOTE — Telephone Encounter (Signed)
While patient was checking out, she wanted to let you know she was interested in going to [facility] if possible. Patient states doctor mentioned to her referring to PT.

## 2017-06-29 LAB — COMPLETE METABOLIC PANEL WITH GFR
AG RATIO: 1.4 (calc) (ref 1.0–2.5)
ALT: 7 U/L (ref 6–29)
AST: 13 U/L (ref 10–35)
Albumin: 4 g/dL (ref 3.6–5.1)
Alkaline phosphatase (APISO): 71 U/L (ref 33–115)
BILIRUBIN TOTAL: 0.3 mg/dL (ref 0.2–1.2)
BUN/Creatinine Ratio: 13 (calc) (ref 6–22)
BUN: 16 mg/dL (ref 7–25)
CHLORIDE: 104 mmol/L (ref 98–110)
CO2: 29 mmol/L (ref 20–32)
Calcium: 9.6 mg/dL (ref 8.6–10.2)
Creat: 1.28 mg/dL — ABNORMAL HIGH (ref 0.50–1.10)
GFR, EST AFRICAN AMERICAN: 57 mL/min/{1.73_m2} — AB (ref >=60)
GFR, Est Non African American: 49 mL/min/{1.73_m2} — ABNORMAL LOW (ref >=60)
Globulin: 2.9 g/dL (calc) (ref 1.9–3.7)
Glucose, Bld: 103 mg/dL — ABNORMAL HIGH (ref 65–99)
POTASSIUM: 4.5 mmol/L (ref 3.5–5.3)
SODIUM: 138 mmol/L (ref 135–146)
TOTAL PROTEIN: 6.9 g/dL (ref 6.1–8.1)

## 2017-06-29 LAB — URINALYSIS, ROUTINE W REFLEX MICROSCOPIC
BILIRUBIN URINE: NEGATIVE
GLUCOSE, UA: NEGATIVE
HGB URINE DIPSTICK: NEGATIVE
KETONES UR: NEGATIVE
Leukocytes, UA: NEGATIVE
Nitrite: NEGATIVE
PH: 6.5 (ref 5.0–8.0)
Protein, ur: NEGATIVE
Specific Gravity, Urine: 1.021 (ref 1.001–1.03)

## 2017-06-29 LAB — CBC WITH DIFFERENTIAL/PLATELET
BASOS PCT: 0.3 %
Basophils Absolute: 23 cells/uL (ref 0–200)
Eosinophils Absolute: 0 cells/uL — ABNORMAL LOW (ref 15–500)
Eosinophils Relative: 0 %
HCT: 39 % (ref 35.0–45.0)
Hemoglobin: 13.2 g/dL (ref 11.7–15.5)
Lymphs Abs: 1018 cells/uL (ref 850–3900)
MCH: 28 pg (ref 27.0–33.0)
MCHC: 33.8 g/dL (ref 32.0–36.0)
MCV: 82.8 fL (ref 80.0–100.0)
MONOS PCT: 4.3 %
MPV: 9.7 fL (ref 7.5–12.5)
Neutro Abs: 6232 cells/uL (ref 1500–7800)
Neutrophils Relative %: 82 %
Platelets: 264 10*3/uL (ref 140–400)
RBC: 4.71 10*6/uL (ref 3.80–5.10)
RDW: 12.9 % (ref 11.0–15.0)
TOTAL LYMPHOCYTE: 13.4 %
WBC: 7.6 10*3/uL (ref 3.8–10.8)
WBCMIX: 327 {cells}/uL (ref 200–950)

## 2017-06-29 LAB — CK: Total CK: 105 U/L (ref 29–143)

## 2017-06-29 NOTE — Progress Notes (Signed)
CK WNL.  CBC and CMP stable.  UA WNL.

## 2017-07-06 ENCOUNTER — Ambulatory Visit: Payer: BC Managed Care – PPO | Admitting: Rheumatology

## 2017-07-26 ENCOUNTER — Ambulatory Visit: Payer: Self-pay | Admitting: Orthopedic Surgery

## 2017-08-17 ENCOUNTER — Encounter (HOSPITAL_BASED_OUTPATIENT_CLINIC_OR_DEPARTMENT_OTHER): Payer: Self-pay | Admitting: *Deleted

## 2017-08-17 ENCOUNTER — Other Ambulatory Visit: Payer: Self-pay

## 2017-08-17 NOTE — H&P (View-Only) (Signed)
SPOKE WITH Connie Brady NPO AFTER MIDNIGHT FOOD CLEAR LIQUIDS UNTIL 800 AM OF SURGERY MEDS TO TAKE  AM OF SURGERY AZATHIOPRINE, DULOXETINE SISTER SAUNDRA SPURGEON DRIVER ECHO [id number] CHART/EPIC EXERCISE TOLERANCE TEST 04-14-13 CHART/EPIC NEEDS I STAT POCT

## 2017-08-17 NOTE — Progress Notes (Signed)
SPOKE WITH Connie Brady NPO AFTER MIDNIGHT FOOD CLEAR LIQUIDS UNTIL 800 AM OF SURGERY MEDS TO TAKE  AM OF SURGERY AZATHIOPRINE, DULOXETINE SISTER SAUNDRA SPURGEON DRIVER ECHO 2-59-56 CHART/EPIC EXERCISE TOLERANCE TEST 04-14-13 CHART/EPIC NEEDS I STAT POCT

## 2017-08-23 ENCOUNTER — Other Ambulatory Visit: Payer: Self-pay | Admitting: Physician Assistant

## 2017-08-24 ENCOUNTER — Encounter (HOSPITAL_BASED_OUTPATIENT_CLINIC_OR_DEPARTMENT_OTHER)
Admission: RE | Disposition: A | Discharge status: Home / Self Care | Payer: Self-pay | Source: Ambulatory Visit | Attending: Specialist

## 2017-08-24 ENCOUNTER — Ambulatory Visit (HOSPITAL_BASED_OUTPATIENT_CLINIC_OR_DEPARTMENT_OTHER)
Admission: RE | Admit: 2017-08-24 | Discharge: 2017-08-24 | Disposition: A | Discharge status: Home / Self Care | Payer: BC Managed Care – PPO | Source: Ambulatory Visit | Attending: Specialist | Admitting: Specialist

## 2017-08-24 ENCOUNTER — Ambulatory Visit (HOSPITAL_BASED_OUTPATIENT_CLINIC_OR_DEPARTMENT_OTHER): Payer: BC Managed Care – PPO | Admitting: Anesthesiology

## 2017-08-24 ENCOUNTER — Ambulatory Visit (HOSPITAL_COMMUNITY): Payer: BC Managed Care – PPO

## 2017-08-24 ENCOUNTER — Encounter (HOSPITAL_BASED_OUTPATIENT_CLINIC_OR_DEPARTMENT_OTHER): Payer: Self-pay | Admitting: Anesthesiology

## 2017-08-24 DIAGNOSIS — M25562 Pain in left knee: Secondary | ICD-10-CM | POA: Diagnosis present

## 2017-08-24 DIAGNOSIS — M84362A Stress fracture, left tibia, initial encounter for fracture: Secondary | ICD-10-CM | POA: Diagnosis not present

## 2017-08-24 DIAGNOSIS — M1712 Unilateral primary osteoarthritis, left knee: Secondary | ICD-10-CM | POA: Insufficient documentation

## 2017-08-24 DIAGNOSIS — S83272A Complex tear of lateral meniscus, current injury, left knee, initial encounter: Secondary | ICD-10-CM | POA: Diagnosis not present

## 2017-08-24 DIAGNOSIS — Z96612 Presence of left artificial shoulder joint: Secondary | ICD-10-CM | POA: Diagnosis not present

## 2017-08-24 DIAGNOSIS — Z9889 Other specified postprocedural states: Secondary | ICD-10-CM

## 2017-08-24 DIAGNOSIS — Z7952 Long term (current) use of systemic steroids: Secondary | ICD-10-CM | POA: Insufficient documentation

## 2017-08-24 DIAGNOSIS — Z791 Long term (current) use of non-steroidal anti-inflammatories (NSAID): Secondary | ICD-10-CM | POA: Insufficient documentation

## 2017-08-24 DIAGNOSIS — Z79899 Other long term (current) drug therapy: Secondary | ICD-10-CM | POA: Diagnosis not present

## 2017-08-24 DIAGNOSIS — M94262 Chondromalacia, left knee: Secondary | ICD-10-CM | POA: Diagnosis not present

## 2017-08-24 DIAGNOSIS — X58XXXA Exposure to other specified factors, initial encounter: Secondary | ICD-10-CM | POA: Diagnosis not present

## 2017-08-24 HISTORY — PX: KNEE ARTHROSCOPY WITH LATERAL MENISECTOMY: SHX6193

## 2017-08-24 LAB — POCT PREGNANCY, URINE: Preg Test, Ur: NEGATIVE

## 2017-08-24 LAB — POCT I-STAT 4, (NA,K, GLUC, HGB,HCT)
GLUCOSE: 87 mg/dL (ref 70–99)
HEMATOCRIT: 37 % (ref 36.0–46.0)
HEMOGLOBIN: 12.6 g/dL (ref 12.0–15.0)
POTASSIUM: 3.5 mmol/L (ref 3.5–5.1)
Sodium: 141 mmol/L (ref 135–145)

## 2017-08-24 SURGERY — ARTHROSCOPY, KNEE, WITH LATERAL MENISCECTOMY
Anesthesia: General | Site: Knee | Laterality: Left

## 2017-08-24 MED ORDER — FENTANYL CITRATE (PF) 100 MCG/2ML IJ SOLN
25.0000 ug | INTRAMUSCULAR | Status: DC | PRN
  Administered 2017-08-24: 25 ug via INTRAVENOUS
  Administered 2017-08-24: 50 ug via INTRAVENOUS
  Administered 2017-08-24: 25 ug via INTRAVENOUS
  Filled 2017-08-24: qty 1

## 2017-08-24 MED ORDER — HYDROMORPHONE HCL 2 MG PO TABS
2.0000 mg | ORAL_TABLET | ORAL | Status: DC | PRN
Start: 2017-08-24 — End: 2017-08-24
  Administered 2017-08-24: 2 mg via ORAL
  Filled 2017-08-24: qty 1

## 2017-08-24 MED ORDER — FENTANYL CITRATE (PF) 100 MCG/2ML IJ SOLN
INTRAMUSCULAR | Status: DC | PRN
  Administered 2017-08-24: 100 ug via INTRAVENOUS
  Administered 2017-08-24 (×4): 25 ug via INTRAVENOUS

## 2017-08-24 MED ORDER — DEXAMETHASONE SODIUM PHOSPHATE 4 MG/ML IJ SOLN
INTRAMUSCULAR | Status: DC | PRN
  Administered 2017-08-24: 10 mg via INTRAVENOUS

## 2017-08-24 MED ORDER — PROPOFOL 10 MG/ML IV BOLUS
INTRAVENOUS | Status: AC
  Filled 2017-08-24: qty 20

## 2017-08-24 MED ORDER — ONDANSETRON HCL 4 MG/2ML IJ SOLN
INTRAMUSCULAR | Status: AC
  Filled 2017-08-24: qty 2

## 2017-08-24 MED ORDER — ONDANSETRON HCL 4 MG/2ML IJ SOLN: INTRAMUSCULAR | Status: DC | PRN

## 2017-08-24 MED ORDER — LACTATED RINGERS IV SOLN
INTRAVENOUS | Status: DC
  Administered 2017-08-24 (×2): via INTRAVENOUS
  Filled 2017-08-24: qty 1000

## 2017-08-24 MED ORDER — CHLORHEXIDINE GLUCONATE 4 % EX LIQD
60.0000 mL | Freq: Once | CUTANEOUS | Status: DC
  Filled 2017-08-24: qty 118

## 2017-08-24 MED ORDER — CEFAZOLIN SODIUM-DEXTROSE 2-4 GM/100ML-% IV SOLN
2.0000 g | INTRAVENOUS | Status: AC
  Administered 2017-08-24: 2 g via INTRAVENOUS
  Filled 2017-08-24: qty 100

## 2017-08-24 MED ORDER — SODIUM CHLORIDE 0.9 % IR SOLN
Status: DC | PRN
  Administered 2017-08-24: 6000 mL

## 2017-08-24 MED ORDER — MORPHINE SULFATE (PF) 4 MG/ML IV SOLN
INTRAVENOUS | Status: DC | PRN
  Administered 2017-08-24: 4 mg via INTRAMUSCULAR

## 2017-08-24 MED ORDER — FENTANYL CITRATE (PF) 100 MCG/2ML IJ SOLN
INTRAMUSCULAR | Status: AC
Start: 2017-08-24 — End: ?
  Filled 2017-08-24: qty 2

## 2017-08-24 MED ORDER — LIDOCAINE 2% (20 MG/ML) 5 ML SYRINGE
INTRAMUSCULAR | Status: DC | PRN
  Administered 2017-08-24: 100 mg via INTRAVENOUS

## 2017-08-24 MED ORDER — ROPIVACAINE HCL 7.5 MG/ML IJ SOLN
INTRAMUSCULAR | Status: DC | PRN
  Administered 2017-08-24: 20 mL via PERINEURAL

## 2017-08-24 MED ORDER — MIDAZOLAM HCL 5 MG/5ML IJ SOLN
INTRAMUSCULAR | Status: DC | PRN
  Administered 2017-08-24: 2 mg via INTRAVENOUS

## 2017-08-24 MED ORDER — LIDOCAINE 2% (20 MG/ML) 5 ML SYRINGE
INTRAMUSCULAR | Status: AC
  Filled 2017-08-24: qty 5

## 2017-08-24 MED ORDER — ASPIRIN EC 325 MG PO TBEC: 325.0000 mg | DELAYED_RELEASE_TABLET | Freq: Two times a day (BID) | ORAL | 0 refills | Status: DC

## 2017-08-24 MED ORDER — PROPOFOL 10 MG/ML IV BOLUS
INTRAVENOUS | Status: DC | PRN
  Administered 2017-08-24: 200 mg via INTRAVENOUS

## 2017-08-24 MED ORDER — KETOROLAC TROMETHAMINE 30 MG/ML IJ SOLN
30.0000 mg | Freq: Once | INTRAMUSCULAR | Status: DC | PRN
  Filled 2017-08-24: qty 1

## 2017-08-24 MED ORDER — MIDAZOLAM HCL 2 MG/2ML IJ SOLN
INTRAMUSCULAR | Status: AC
  Filled 2017-08-24: qty 2

## 2017-08-24 MED ORDER — CEFAZOLIN SODIUM-DEXTROSE 2-4 GM/100ML-% IV SOLN
INTRAVENOUS | Status: AC
  Filled 2017-08-24: qty 100

## 2017-08-24 MED ORDER — MORPHINE SULFATE (PF) 4 MG/ML IV SOLN
INTRAVENOUS | Status: AC
  Filled 2017-08-24: qty 1

## 2017-08-24 MED ORDER — FENTANYL CITRATE (PF) 100 MCG/2ML IJ SOLN
INTRAMUSCULAR | Status: AC
  Filled 2017-08-24: qty 2

## 2017-08-24 MED ORDER — BUPIVACAINE HCL (PF) 0.25 % IJ SOLN
INTRAMUSCULAR | Status: AC
  Filled 2017-08-24: qty 30

## 2017-08-24 MED ORDER — DEXAMETHASONE SODIUM PHOSPHATE 10 MG/ML IJ SOLN
INTRAMUSCULAR | Status: AC
  Filled 2017-08-24: qty 1

## 2017-08-24 MED ORDER — HYDROMORPHONE HCL 2 MG PO TABS
ORAL_TABLET | ORAL | Status: AC
  Filled 2017-08-24: qty 1

## 2017-08-24 MED ORDER — EPHEDRINE 5 MG/ML INJ
INTRAVENOUS | Status: AC
  Filled 2017-08-24: qty 10

## 2017-08-24 MED ORDER — PROMETHAZINE HCL 25 MG/ML IJ SOLN
6.2500 mg | INTRAMUSCULAR | Status: DC | PRN
  Filled 2017-08-24: qty 1

## 2017-08-24 MED ORDER — ONDANSETRON HCL 4 MG/2ML IJ SOLN
INTRAMUSCULAR | Status: DC | PRN
  Administered 2017-08-24 (×2): 4 mg via INTRAVENOUS

## 2017-08-24 MED ORDER — BUPIVACAINE HCL 0.25 % IJ SOLN
INTRAMUSCULAR | Status: DC | PRN
  Administered 2017-08-24: 30 mL

## 2017-08-24 MED ORDER — EPHEDRINE SULFATE-NACL 50-0.9 MG/10ML-% IV SOSY
PREFILLED_SYRINGE | INTRAVENOUS | Status: DC | PRN
  Administered 2017-08-24: 10 mg via INTRAVENOUS

## 2017-08-24 MED ORDER — CEPHALEXIN 500 MG PO CAPS: 500.0000 mg | ORAL_CAPSULE | Freq: Three times a day (TID) | ORAL | 0 refills | Status: DC

## 2017-08-24 MED ORDER — HYDROMORPHONE HCL 2 MG PO TABS: 2.0000 mg | ORAL_TABLET | ORAL | 0 refills | Status: DC | PRN

## 2017-08-24 SURGICAL SUPPLY — 39 items
BANDAGE ESMARK 6X9 LF (GAUZE/BANDAGES/DRESSINGS) ×1 IMPLANT
BLADE CUDA GRT WHITE 3.5 (BLADE) ×2 IMPLANT
BNDG CMPR 9X6 STRL LF SNTH (GAUZE/BANDAGES/DRESSINGS) ×1
BNDG ESMARK 6X9 LF (GAUZE/BANDAGES/DRESSINGS) ×2
BNDG GAUZE ELAST 4 BULKY (GAUZE/BANDAGES/DRESSINGS) ×2 IMPLANT
DRAPE ARTHROSCOPY W/POUCH 114 (DRAPES) ×2 IMPLANT
DRAPE C-ARM 42X72 X-RAY (DRAPES) ×2 IMPLANT
DRAPE INCISE IOBAN 66X45 STRL (DRAPES) ×2 IMPLANT
DRAPE LG THREE QUARTER DISP (DRAPES) ×3 IMPLANT
DRAPE U-SHAPE 47X51 STRL (DRAPES) ×2 IMPLANT
DURAPREP 26ML APPLICATOR (WOUND CARE) ×2 IMPLANT
ELECT MENISCUS 165MM 90D (ELECTRODE) IMPLANT
GAUZE SPONGE 4X4 12PLY STRL (GAUZE/BANDAGES/DRESSINGS) ×2 IMPLANT
GAUZE SPONGE 4X4 12PLY STRL LF (GAUZE/BANDAGES/DRESSINGS) ×1 IMPLANT
GAUZE XEROFORM 1X8 LF (GAUZE/BANDAGES/DRESSINGS) ×2 IMPLANT
GLOVE BIO SURGEON STRL SZ7.5 (GLOVE) ×3 IMPLANT
GLOVE BIO SURGEON STRL SZ8 (GLOVE) ×2 IMPLANT
GLOVE INDICATOR 8.0 STRL GRN (GLOVE) ×4 IMPLANT
GOWN STRL REUS W/TWL LRG LVL3 (GOWN DISPOSABLE) ×2 IMPLANT
GOWN STRL REUS W/TWL XL LVL3 (GOWN DISPOSABLE) ×4 IMPLANT
IV NS IRRIG 3000ML ARTHROMATIC (IV SOLUTION) ×4 IMPLANT
KIT KNEE SUBCHONDROPLASTY (Joint) ×1 IMPLANT
KIT MIXER ACCUMIX (KITS) ×1 IMPLANT
KIT TURNOVER CYSTO (KITS) ×2 IMPLANT
KNEE WRAP E Z 3 GEL PACK (MISCELLANEOUS) ×2 IMPLANT
MANIFOLD NEPTUNE II (INSTRUMENTS) ×2 IMPLANT
NEEDLE HYPO 22GX1.5 SAFETY (NEEDLE) ×1 IMPLANT
PACK ARTHROSCOPY DSU (CUSTOM PROCEDURE TRAY) ×2 IMPLANT
PACK BASIN DAY SURGERY FS (CUSTOM PROCEDURE TRAY) ×2 IMPLANT
PAD ABD 8X10 STRL (GAUZE/BANDAGES/DRESSINGS) ×2 IMPLANT
PAD ARMBOARD 7.5X6 YLW CONV (MISCELLANEOUS) ×2 IMPLANT
PROBE BIPOLAR 50 DEGREE SUCT (MISCELLANEOUS) IMPLANT
SET ARTHROSCOPY TUBING (MISCELLANEOUS) ×2
SET ARTHROSCOPY TUBING LN (MISCELLANEOUS) ×1 IMPLANT
SUT ETHILON 4 0 PS 2 18 (SUTURE) ×2 IMPLANT
SYR CONTROL 10ML LL (SYRINGE) ×2 IMPLANT
TOWEL OR 17X24 6PK STRL BLUE (TOWEL DISPOSABLE) ×2 IMPLANT
TUBE CONNECTING 12X1/4 (SUCTIONS) ×1 IMPLANT
WATER STERILE IRR 500ML POUR (IV SOLUTION) ×2 IMPLANT

## 2017-08-24 NOTE — Interval H&P Note (Signed)
History and Physical Interval Note:  08/24/2017 1:57 PM  Connie Brady  has presented today for surgery, with the diagnosis of Left knee torn lateral meniscus, osteoarthritis, insufficiency fracture lateral tibial plateau  The various methods of treatment have been discussed with the patient and family. After consideration of risks, benefits and other options for treatment, the patient has consented to  Procedure(s): LEFT KNEE ARTHROSCOPY WITH LATERAL MENISECTOMY, CHONDROPLASTY, ARTHROSCOPIC ASSISTED INTERNAL FIXATION LATERAL TIBIAL PLATEAU (Left) as a surgical intervention .  The patient's history has been reviewed, patient examined, no change in status, stable for surgery.  I have reviewed the patient's chart and labs.  Questions were answered to the patient's satisfaction.     Bayli Quesinberry ANDREW

## 2017-08-24 NOTE — Anesthesia Preprocedure Evaluation (Addendum)
Anesthesia Evaluation  Patient identified by MRN, date of birth, ID band Patient awake    Reviewed: Allergy & Precautions, NPO status , Patient's Chart, lab work & pertinent test results  Airway Mallampati: II  TM Distance: >3 FB Neck ROM: Full    Dental no notable dental hx.    Pulmonary neg pulmonary ROS,    Pulmonary exam normal breath sounds clear to auscultation       Cardiovascular negative cardio ROS Normal cardiovascular exam Rhythm:Regular Rate:Normal     Neuro/Psych negative neurological ROS  negative psych ROS   GI/Hepatic negative GI ROS, Neg liver ROS,   Endo/Other  negative endocrine ROS  Renal/GU negative Renal ROS  negative genitourinary   Musculoskeletal negative musculoskeletal ROS (+)   Abdominal   Peds negative pediatric ROS (+)  Hematology negative hematology ROS (+)   Anesthesia Other Findings   Reproductive/Obstetrics negative OB ROS                             Anesthesia Physical Anesthesia Plan  ASA: II  Anesthesia Plan: General   Post-op Pain Management:  Regional for Post-op pain   Induction: Intravenous  PONV Risk Score and Plan: 3 and Ondansetron, Dexamethasone, Treatment may vary due to age or medical condition and Midazolam  Airway Management Planned: LMA  Additional Equipment:   Intra-op Plan:   Post-operative Plan: Extubation in OR  Informed Consent: I have reviewed the patients History and Physical, chart, labs and discussed the procedure including the risks, benefits and alternatives for the proposed anesthesia with the patient or authorized representative who has indicated his/her understanding and acceptance.   Dental advisory given  Plan Discussed with: CRNA and Surgeon  Anesthesia Plan Comments:        Anesthesia Quick Evaluation

## 2017-08-24 NOTE — Anesthesia Postprocedure Evaluation (Signed)
Anesthesia Post Note  Patient: Connie Brady  Procedure(s) Performed: LEFT KNEE ARTHROSCOPY WITH LATERAL MENISECTOMY, CHONDROPLASTY, ARTHROSCOPIC ASSISTED INTERNAL FIXATION LATERAL TIBIAL PLATEAU (Left Knee)     Patient location during evaluation: PACU Anesthesia Type: General Level of consciousness: awake and alert Pain management: pain level controlled Vital Signs Assessment: post-procedure vital signs reviewed and stable Respiratory status: spontaneous breathing, nonlabored ventilation and respiratory function stable Cardiovascular status: blood pressure returned to baseline and stable Postop Assessment: no apparent nausea or vomiting Anesthetic complications: no    Last Vitals:  Vitals:   08/24/17 1545 08/24/17 1600  BP: 123/76 120/73  Pulse: 86 90  Resp: (!) 24 (!) 27  Temp:    SpO2: 94% (!) 89%    Last Pain:  Vitals:   08/24/17 1615  TempSrc:   PainSc: [place]

## 2017-08-24 NOTE — Progress Notes (Signed)
1337 time out for block with Dr Kalman Shan and nurse. Versed 2 mg and Fentanyl 100 mcg given for sedation. 1340 block completed tolerated well.

## 2017-08-24 NOTE — Discharge Instructions (Signed)

## 2017-08-24 NOTE — Op Note (Signed)
Dictated

## 2017-08-24 NOTE — Telephone Encounter (Signed)
Last Visit: 06/28/17 Next Visit: 10/07/17 Labs: 06/28/17 CBC and CMP stable  Okay to refill per Dr. Estanislado Pandy

## 2017-08-24 NOTE — Anesthesia Procedure Notes (Signed)
Anesthesia Procedure Image    

## 2017-08-24 NOTE — Anesthesia Procedure Notes (Signed)
Procedure Name: LMA Insertion Date/Time: 08/24/2017 2:07 PM Performed by: Myrtie Soman, MD Pre-anesthesia Checklist: Patient identified, Emergency Drugs available, Suction available and Patient being monitored Patient Re-evaluated:Patient Re-evaluated prior to induction Oxygen Delivery Method: Circle system utilized Preoxygenation: Pre-oxygenation with 100% oxygen Induction Type: IV induction Ventilation: Mask ventilation without difficulty LMA: LMA inserted LMA Size: 4.0 Number of attempts: 1 Airway Equipment and Method: Bite block Placement Confirmation: positive ETCO2 Tube secured with: Tape Dental Injury: Teeth and Oropharynx as per pre-operative assessment

## 2017-08-24 NOTE — Op Note (Signed)
NAME: Connie Brady, Connie Brady MEDICAL RECORD EX:[id number] ACCOUNT [id number] DATE OF BIRTH:Mar 31, 1970 FACILITY: [facility] LOCATION: [facility] PHYSICIAN:Ikia Cincotta Gwinda Passe, MD  OPERATIVE REPORT  DATE OF PROCEDURE:  08/24/2017  POSTOPERATIVE DIAGNOSES:   1.  Left knee complex tear of the lateral meniscus. 2.  Osteoarthritis.   3.  Bone marrow edema insufficiency fracture lateral tibial plateau, left knee.  POSTOPERATIVE DIAGNOSES: 1.  Left knee complex tear of the lateral meniscus. 2.  Osteoarthritis.   3.  Bone marrow edema insufficiency fracture lateral tibial plateau, left knee.  PROCEDURES: 1.  Left knee arthroscopic partial lateral meniscectomy. 2.  Chondroplasty. 3.  Arthroscopic assisted internal fixation of lateral tibial plateau insufficiency fracture.    SURGEON:  Sydnee Cabal, MD.    ASSISTANT:  Jerilynn Mages, PA-C.  ANESTHESIA:  General with intraoperative knee block.  ESTIMATED BLOOD LOSS:  Minimal.  DRAINS:  None.  COMPLICATIONS:  None.  TOURNIQUET TIME:  30 minutes at 300 mmHg.  DISPOSITION:  PACU stable.  DESCRIPTION OF PROCEDURE:  The patient and family were counseled in holding area.  The correct site was identified and marked site appropriately.  IV antibiotics were given to the operating room and placed in supine position under LMA anesthesia, the  left leg prepped with draped in a sterile fashion.  A time-out done confirming left side.  Next, an Esmarch, tourniquet inflated to 300 mmHg.  Arthroscopic portals were established proximal inferomedial, inferolateral.  Diagnostic arthroscopy.   Patellofemoral joint and tracking articular cartilage normal tone and color.  The medial gutter was unremarkable.  ACL and PCL were intact.  The right medial side was inspected.  Normal articular cartilage in the medial meniscus.  The lateral side was  inspected.  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  Following this, the C-arm was brought in to localize the insufficiency fracture in both AP and lateral planes based on the MRI scan.  Utilized the [employer].  Put the trocar to stress reaction insufficiency fracture  and then placed 4 tubes of calcium phosphate.  At this point in time, we then ____.  There was no intra-articular extravasation nor was there any extraarticular.  We allowed the ____ to cure and the trocar was removed without complication or problem.   The portals were closed with 4-0 nylon suture.  Another 10 mL of ropivacaine was placed in the skin and joint.  Sterile dressing was applied and the tourniquet deflated.  Normal circulation at the end of the case.  She was taken from the operating room  to the PACU in stable condition.  She will be stabilized in the PACU and discharged home.  Since this was a posterolateral tibial plateau insufficiency fracture, I was extremely cautious with palpating the fibular head, peroneal nerve throughout the entire procedure to make sure that it was not compromised.  She will be stabilized in the PACU  and discharged home.  TN/NUANCE  D:08/24/2017 T:08/24/2017 JOB:[id number]

## 2017-08-24 NOTE — Anesthesia Procedure Notes (Signed)
Anesthesia Regional Block: Adductor canal block   Pre-Anesthetic Checklist: ,, timeout performed, Correct Patient, Correct Site, Correct Laterality, Correct Procedure, Correct Position, site marked, Risks and benefits discussed,  Surgical consent,  Pre-op evaluation,  At surgeon's request and post-op pain management  Laterality: Left  Prep: chloraprep       Needles:  Injection technique: Single-shot  Needle Type: Echogenic Needle     Needle Length: 9cm      Additional Needles:   Procedures:,,,, ultrasound used (permanent image in chart),,,,  Narrative:  Start time: 08/24/2017 1:32 PM End time: 08/24/2017 1:40 PM Injection made incrementally with aspirations every 5 mL.  Performed by: Personally  Anesthesiologist: Myrtie Soman, MD  Additional Notes: Patient tolerated the procedure well without complications

## 2017-08-24 NOTE — H&P (Signed)
Connie Brady is an 47 y.o. female.   Chief Complaint: left knee pain [id number] presents with joint discomfort that had been persistent for several weeks now. Despite conservative treatments, the patients discomfort has not improved. Imaging was obtained. Other conservative and surgical treatments were discussed in detail. Patient wishes to proceed with surgery as consented. Denies SOB, CP, or calf pain. No Fever, chills, or nausea/ vomiting.   Past Medical History:  Diagnosis Date  . Anemia   . Polymyositis ([place])   . Sjoegren syndrome     Past Surgical History:  Procedure Laterality Date  . BUNIONECTOMY Bilateral   . KNEE ARTHROPLASTY Left 2018   meniscal tear  . KNEE SURGERY     right  . MUSCLE BIOPSY     r/thigh  . PLANTAR FASCIA SURGERY Right   . TOTAL SHOULDER ARTHROPLASTY Left 01/12/2017    Family History  Problem Relation Age of Onset  . Hypertension Sister   . Hypertension Brother   . Diabetes Brother   . Hypertension Sister   . Hypertension Sister   . Hypertension Brother   . Diabetes Brother    Social History:  reports that she has never smoked. She has never used smokeless tobacco. She reports that she does not drink alcohol or use drugs.  Allergies:  Allergies  Allergen Reactions  . Lyrica [Pregabalin] Itching  . Percocet [Oxycodone-Acetaminophen] Rash and Other (See Comments)    Hallucinations     Medications Prior to Admission  Medication Sig Dispense Refill  . azaTHIOprine (IMURAN) 50 MG tablet TAKE 1 TABLET(50 MG) BY MOUTH TWICE DAILY 60 tablet 2  . DULoxetine (CYMBALTA) 60 MG capsule duloxetine 60 mg capsule,delayed release  TK ONE C PO QAM    . pilocarpine (SALAGEN) 5 MG tablet TAKE 1 TABLET(5 MG) BY MOUTH THREE TIMES DAILY AS NEEDED 90 tablet 0  . predniSONE (DELTASONE) 1 MG tablet Take 1 tablet (1 mg total) by mouth daily with breakfast. (Patient taking differently: Take 7 mg by mouth daily with breakfast. ) 180 tablet 0  . traZODone  (DESYREL) 100 MG tablet Take 200 mg by mouth at bedtime.     . Vitamin D, Ergocalciferol, (DRISDOL) 50000 units CAPS capsule Take 50,000 Units by mouth 2 (two) times a week.     . zolpidem (AMBIEN) 10 MG tablet Take 10 mg by mouth at bedtime as needed.     . diclofenac sodium (VOLTAREN) 1 % GEL diclofenac 1 % topical gel    . esomeprazole (NEXIUM) 20 MG capsule Take 20 mg by mouth as needed.     . gabapentin (NEURONTIN) 300 MG capsule Take 1 capsule (300 mg total) by mouth 2 (two) times daily. (Patient taking differently: Take 300 mg by mouth at bedtime. ) 180 capsule 0  . ibuprofen (ADVIL,MOTRIN) 800 MG tablet ibuprofen 800 mg tablet    . lidocaine (XYLOCAINE) 5 % ointment     . LOPREEZA 1-0.5 MG tablet daily.  12  . Multiple Vitamins-Minerals (MULTIVITAMIN WITH MINERALS) tablet Take 1 tablet by mouth daily.      Results for orders placed or performed during the hospital encounter of 08/24/17 (from the past 48 hour(s))  Pregnancy, urine POC     Status: None   Collection Time: 08/24/17 12:34 PM  Result Value Ref Range   Preg Test, Ur NEGATIVE NEGATIVE    Comment:        THE SENSITIVITY OF THIS METHODOLOGY IS >24 mIU/mL   I-STAT 4, (NA,K, GLUC, HGB,HCT)  Status: None   Collection Time: 08/24/17 12:42 PM  Result Value Ref Range   Sodium 141 135 - 145 mmol/L   Potassium 3.5 3.5 - 5.1 mmol/L   Glucose, Bld 87 70 - 99 mg/dL   HCT 37.0 36.0 - 46.0 %   Hemoglobin 12.6 12.0 - 15.0 g/dL   No results found.  Review of Systems  Constitutional: Negative.   HENT: Negative.   Eyes: Negative.   Respiratory: Negative.   Cardiovascular: Negative.   Gastrointestinal: Negative.   Genitourinary: Negative.   Musculoskeletal: Positive for joint pain.  Skin: Negative.   Neurological: Negative.   Endo/Heme/Allergies: Negative.   Psychiatric/Behavioral: Negative.     Blood pressure 127/80, pulse 75, temperature 97.8 F (36.6 C), temperature source Oral, resp. rate 16, height 5\' 8"  (1.727  m), weight 97.3 kg (214 lb 8 oz), last menstrual period 07/26/2017, SpO2 100 %. Physical Exam  Constitutional: She is oriented to person, place, and time. She appears well-developed.  HENT:  Head: Normocephalic.  Eyes: EOM are normal.  Neck: Normal range of motion.  Cardiovascular: Normal rate and intact distal pulses.  Respiratory: Effort normal.  GI: Soft.  Genitourinary:  Genitourinary Comments: Deferred  Musculoskeletal:  Left knee lateral pain. Calf is soft. Good ROM. Knee is stable.  Neurological: She is alert and oriented to person, place, and time.  Skin: Skin is warm and dry.  Psychiatric: Her behavior is normal.     Assessment/Plan Left knee subchondral fracture: Knee scope as consented D/c home with family follow instructions ASA at D/c F/u in office in 1 week or PRN  Lexton Hidalgo L, PA-C 08/24/2017, 1:38 PM

## 2017-08-24 NOTE — Transfer of Care (Signed)
ILast Vitals:  Vitals Value Taken Time  BP 131/82 08/24/2017  3:15 PM  Temp 36.3 C 08/24/2017  3:12 PM  Pulse 86 08/24/2017  3:18 PM  Resp 21 08/24/2017  3:18 PM  SpO2 100 % 08/24/2017  3:18 PM  Vitals shown include unvalidated device data.  Last Pain:  Vitals:   08/24/17 1512  TempSrc:   PainSc: 0-No pain      Patients Stated Pain Goal: 7 (08/24/17 1228)  Immediate Anesthesia Transfer of Care Note  Patient: Connie Brady  Procedure(s) Performed: Procedure(s) (LRB): LEFT KNEE ARTHROSCOPY WITH LATERAL MENISECTOMY, CHONDROPLASTY, ARTHROSCOPIC ASSISTED INTERNAL FIXATION LATERAL TIBIAL PLATEAU (Left)  Patient Location: PACU  Anesthesia Type: General  Level of Consciousness: awake, alert  and oriented  Airway & Oxygen Therapy: Patient Spontanous Breathing and Patient connected to nasal cannula oxygen  Post-op Assessment: Report given to PACU RN and Post -op Vital signs reviewed and stable  Post vital signs: Reviewed and stable  Complications: No apparent anesthesia complications

## 2017-08-25 ENCOUNTER — Encounter (HOSPITAL_BASED_OUTPATIENT_CLINIC_OR_DEPARTMENT_OTHER): Payer: Self-pay | Admitting: Specialist

## 2017-09-15 ENCOUNTER — Telehealth: Payer: Self-pay | Admitting: Rheumatology

## 2017-09-15 MED ORDER — PILOCARPINE HCL 5 MG PO TABS: ORAL_TABLET | ORAL | 0 refills | Status: DC

## 2017-09-15 NOTE — Telephone Encounter (Signed)
Patient called requesting prescription refill of Pilocarpine to be sent to [facility] on [address] in [place].

## 2017-09-15 NOTE — Telephone Encounter (Signed)
Last visit: 06/28/2017 Next visit: 10/07/2017  Okay to refill per Dr. Estanislado Pandy.

## 2017-09-22 ENCOUNTER — Telehealth: Payer: Self-pay | Admitting: Rheumatology

## 2017-09-22 MED ORDER — PREDNISONE 5 MG PO TABS: 5.0000 mg | ORAL_TABLET | Freq: Every day | ORAL | 0 refills | Status: DC

## 2017-09-22 NOTE — Telephone Encounter (Signed)
Patient requesting refill on Prednisone 5mg  sent to [facility] on [address].

## 2017-09-22 NOTE — Telephone Encounter (Signed)
Last Visit: 06/28/17 Next Visit: 10/07/17  Okay to refill per Dr. Estanislado Pandy

## 2017-10-04 NOTE — Progress Notes (Signed)
Office Visit Note  Patient: Connie Brady             Date of Birth: [birth year]           MRN: [id number]             PCP: Kelton Pillar, MD Referring: Kelton Pillar, MD Visit Date: 10/07/2017 Occupation: @GUAROCC @  Subjective:  Calf tenderness   History of Present Illness: Connie Brady is a 47 y.o. female with history of polymyositis, Sjogren's syndrome, osteoarthritis, and DDD.  She is taking Imuran 50 mg 1 tablet po twice daily and Prednisone 7 mg po daily.  She takes Pilocarpine 5 mg TID PRN for sicca symptoms.  Patient reports she had a left knee arthroscopy performed on 08/24/17 for a hairline fracture.  She states she recently finished PT and feels like she continues to improve.  She continues to have chronic calf tenderness and muscle weakness.  She performs calf raises and remains very active.  She reports she has been having increased throat dryness for 1 month.  She started taking Nexium last week.  She has not noticed a benefit yet.  She is unsure if her symptoms are due to GERD or Sjogren's.  She denies having an EGD in the past and she does not see  GI specialist.    Activities of Daily Living:  Patient reports morning stiffness all day.   Patient Reports nocturnal pain.  Difficulty dressing/grooming: Denies Difficulty climbing stairs: Denies Difficulty getting out of chair: Reports Difficulty using hands for taps, buttons, cutlery, and/or writing: Denies  Review of Systems  Constitutional: Positive for fatigue.  HENT: Positive for mouth dryness. Negative for mouth sores, trouble swallowing, trouble swallowing and nose dryness.   Eyes: Positive for dryness. Negative for pain and visual disturbance.  Respiratory: Negative for cough, hemoptysis, shortness of breath and difficulty breathing.   Cardiovascular: Negative for chest pain, palpitations, hypertension and swelling in legs/feet.  Gastrointestinal: Negative for abdominal pain, blood in stool, constipation,  diarrhea, nausea and vomiting.  Endocrine: Negative for increased urination.  Genitourinary: Negative for painful urination.  Musculoskeletal: Positive for arthralgias, joint pain, joint swelling and morning stiffness. Negative for myalgias, muscle weakness, muscle tenderness and myalgias.  Skin: Negative for color change, pallor, rash, hair loss, nodules/bumps, skin tightness, ulcers and sensitivity to sunlight.  Allergic/Immunologic: Negative for susceptible to infections.  Neurological: Positive for headaches. Negative for dizziness, light-headedness and numbness.  Hematological: Negative for swollen glands.  Psychiatric/Behavioral: Negative for depressed mood, confusion and sleep disturbance. The patient is not nervous/anxious.     PMFS History:  Patient Active Problem List   Diagnosis Date Noted  . Left lateral knee pain 08/24/2017  . S/P arthroscopic surgery of left knee 08/24/2017  . Abnormal cervical Papanicolaou smear 08/31/2016  . Obesity 08/31/2016  . Premenstrual symptom 08/31/2016  . Vitamin D deficiency 08/25/2016  . History of chronic kidney disease 05/07/2016  . Chronic kidney disease (CKD), stage III (moderate) ([place]) 03/30/2016  . High risk medication use 01/10/2016  . Primary osteoarthritis of both hips 01/10/2016  . Primary osteoarthritis of both knees 01/10/2016  . Primary osteoarthritis of both feet 01/10/2016  . Bilateral plantar fasciitis 01/10/2016  . Chest pain 01/26/2013  . Polymyositis ([place]) 01/26/2013  . Sjoegren syndrome     Past Medical History:  Diagnosis Date  . Anemia   . Polymyositis ([place])   . Sjoegren syndrome     Family History  Problem Relation Age of Onset  .  Hypertension Sister   . Hypertension Brother   . Diabetes Brother   . Hypertension Sister   . Hypertension Sister   . Hypertension Brother   . Diabetes Brother    Past Surgical History:  Procedure Laterality Date  . BUNIONECTOMY Bilateral   . KNEE ARTHROPLASTY Left 2018    meniscal tear  . KNEE ARTHROSCOPY WITH LATERAL MENISECTOMY Left 08/24/2017   Procedure: LEFT KNEE ARTHROSCOPY WITH LATERAL MENISECTOMY, CHONDROPLASTY, ARTHROSCOPIC ASSISTED INTERNAL FIXATION LATERAL TIBIAL PLATEAU;  Surgeon: Sydnee Cabal, MD;  Location: [facility];  Service: Orthopedics;  Laterality: Left;  . KNEE SURGERY     right  . MUSCLE BIOPSY     r/thigh  . PLANTAR FASCIA SURGERY Right   . TOTAL SHOULDER ARTHROPLASTY Left 01/12/2017   Social History   Social History Narrative  . Not on file    Objective: Vital Signs: BP 103/71 (BP Location: Left Arm, Patient Position: Sitting, Cuff Size: Normal)   Pulse 91   Resp 14   Ht 5\' 8"  (1.727 m)   Wt 218 lb 6.4 oz (99.1 kg)   BMI 33.21 kg/m    Physical Exam  Constitutional: She is oriented to person, place, and time. She appears well-developed and well-nourished.  HENT:  Head: Normocephalic and atraumatic.  Eyes: Conjunctivae and EOM are normal.  Neck: Normal range of motion.  Cardiovascular: Normal rate, regular rhythm, normal heart sounds and intact distal pulses.  Pulmonary/Chest: Effort normal and breath sounds normal.  Abdominal: Soft. Bowel sounds are normal.  Lymphadenopathy:    She has no cervical adenopathy.  Neurological: She is alert and oriented to person, place, and time.  Skin: Skin is warm and dry. Capillary refill takes less than 2 seconds.  Psychiatric: She has a normal mood and affect. Her behavior is normal.  Nursing note and vitals reviewed.    Musculoskeletal Exam: C-spine, thoracic spine, and lumbar spine good ROM.  No midline spinal tenderness.  No SI joint tenderness.  Shoulder joints, elbow joints, wrist joints, MCPs, PIPs, and DIPs good ROM with no synovitis.  Hip joints, knee joints, ankle joints, MTPs, PIPs, and DIPs good ROM with no synovitis.  Mild left knee warmth.  No tenderness of trochanteric bursa bilaterally. 5/5 muscle strength of upper and lower extremities.  No difficulty  getting up from a chair or raising arms above her head.   CDAI Exam: CDAI Score: Not documented Patient Global Assessment: Not documented; Provider Global Assessment: Not documented Swollen: Not documented; Tender: Not documented Joint Exam   Not documented   There is currently no information documented on the homunculus. Go to the Rheumatology activity and complete the homunculus joint exam.  Investigation: No additional findings.  Imaging: No results found.  Recent Labs: Lab Results  Component Value Date   WBC 7.6 06/28/2017   HGB 12.6 08/24/2017   PLT 264 06/28/2017   NA 141 08/24/2017   K 3.5 08/24/2017   CL 104 06/28/2017   CO2 29 06/28/2017   GLUCOSE 87 08/24/2017   BUN 16 06/28/2017   CREATININE 1.28 (H) 06/28/2017   BILITOT 0.3 06/28/2017   ALKPHOS 57 08/20/2016   AST 13 06/28/2017   ALT 7 06/28/2017   PROT 6.9 06/28/2017   ALBUMIN 3.6 08/20/2016   CALCIUM 9.6 06/28/2017   GFRAA 57 (L) 06/28/2017    Speciality Comments: No specialty comments available.  Procedures:  No procedures performed Allergies: Lyrica [pregabalin] and Percocet [oxycodone-acetaminophen]   Assessment / Plan:  Visit Diagnoses: Polymyositis ([place]) - History of elevated CK, positive muscle biopsy, myositis panel negative, initial CK 522: She continues to have calf tenderness bilaterally and reports muscle weakness.  On exam she has full strength of upper and lower extremities.  She has no difficulty getting up from a chair or raising arms above her head.  She completed PT recently following a left knee arthroscopic surgery, which she feels has improved her muscle weakness. She continues to take Imuran 50 mg po 1 tablet BID and prednisone 7 mg daily.  We discussed the risk of long-term prednisone use.  She has tried to lower prednisone dose in the past, but she developed increased muscle weakness and tenderness.  She was reminded to keep up with health screenings such as mammograms and  colonoscopies.  She has not had a colonoscopy yet. CK was 103 on 06/28/17.  We will recheck CK today.  She was advised to notify us of any new or worsening symptoms.     High risk medication use - Imuran 50 mg po 1 tablet BID.  CBC, CMP, and CK were ordered today. - Plan: CBC with Differential/Platelet, COMPLETE METABOLIC PANEL WITH GFR, CK  Sjogren's syndrome with keratoconjunctivitis sicca (H[facility]) - ANA 1:40 centromere, +SSA. She takes Pilocarpine 5 mg TID PRN for sicca symptoms.  She has been having increased throat dryness for the past 1 month.  She is unsure if it is due to GERD or Sjogren's.  She recently started on Nexium, but she has not noticed any benefit yet.  A referral to GI was placed today for evaluation.  Side effects and possible drug interactions of Nexium were reviewed with the patient by the clinician pharmacist in the office today.   Primary osteoarthritis of both hips: She has good ROM with no discomfort at this time.  She has no groin pain.   Primary osteoarthritis of both knees: Mild warmth of left knee joint on exam.  She had a left knee arthroscopy performed on 08/24/17.  She completed PT recently and continues to notice improves.  No warmth or effusion of right knee joint.  We discussed muscle strengthening.   Primary osteoarthritis of both feet: She has no discomfort at this time.  She wears proper fitting shoes.   Bilateral plantar fasciitis: She has no symptoms at this time.   DDD (degenerative disc disease), lumbar: Chronic pain  On prednisone therapy: She is on Prednisone 7 mg po daily.   History of vitamin D deficiency: She takes a vitamin D supplement daily.   History of chronic kidney disease - Followed up by Dr. Moshe Cipro   Gastroesophageal reflux disease, esophagitis presence not specified -She has been experiencing symptoms of severe reflux for the past 1 month.  She recently started taking Nexium but she has not noticed a benefit yet. We discussed avoiding  trigger foods and eating smaller meals and not eating before bedtime.  A referral was placed to GI for evaluation.  Patient was also reminded of important health maintenance such as having a  colonoscopy and mammogram as well.    Plan: Ambulatory referral to Gastroenterology   Orders: Orders Placed This Encounter  Procedures  . CBC with Differential/Platelet  . COMPLETE METABOLIC PANEL WITH GFR  . CK  . Ambulatory referral to Gastroenterology   No orders of the defined types were placed in this encounter.   Face-to-face time spent with patient was 30 minutes. Greater than 50% of time was spent in counseling and coordination of  care.  Follow-Up Instructions: Return in about 5 months (around 03/09/2018) for Polymyositis, Sjogren's syndrome, Osteoarthritis.   Ofilia Neas, PA-C   I examined and evaluated the patient with [name] PA.  Patient has been complaining of increased fatigue.  She had no increased muscle weakness or tenderness on examination today.  The plan of care was discussed as noted above.  Bo Merino, MD Note - This record has been created using Editor, commissioning.  Chart creation errors have been sought, but may not always  have been located. Such creation errors do not reflect on  the standard of medical care.

## 2017-10-07 ENCOUNTER — Ambulatory Visit: Payer: BC Managed Care – PPO | Admitting: Rheumatology

## 2017-10-07 ENCOUNTER — Encounter: Payer: Self-pay | Admitting: Rheumatology

## 2017-10-07 VITALS — BP 103/71 | HR 91 | Resp 14 | Ht 68.0 in | Wt 218.4 lb

## 2017-10-07 DIAGNOSIS — Z7952 Long term (current) use of systemic steroids: Secondary | ICD-10-CM

## 2017-10-07 DIAGNOSIS — Z8639 Personal history of other endocrine, nutritional and metabolic disease: Secondary | ICD-10-CM

## 2017-10-07 DIAGNOSIS — M160 Bilateral primary osteoarthritis of hip: Secondary | ICD-10-CM | POA: Diagnosis not present

## 2017-10-07 DIAGNOSIS — M5136 Other intervertebral disc degeneration, lumbar region: Secondary | ICD-10-CM

## 2017-10-07 DIAGNOSIS — Z79899 Other long term (current) drug therapy: Secondary | ICD-10-CM

## 2017-10-07 DIAGNOSIS — M3501 Sicca syndrome with keratoconjunctivitis: Secondary | ICD-10-CM

## 2017-10-07 DIAGNOSIS — M51369 Other intervertebral disc degeneration, lumbar region without mention of lumbar back pain or lower extremity pain: Secondary | ICD-10-CM

## 2017-10-07 DIAGNOSIS — M19072 Primary osteoarthritis, left ankle and foot: Secondary | ICD-10-CM

## 2017-10-07 DIAGNOSIS — K219 Gastro-esophageal reflux disease without esophagitis: Secondary | ICD-10-CM

## 2017-10-07 DIAGNOSIS — M170 Bilateral primary osteoarthritis of knee: Secondary | ICD-10-CM

## 2017-10-07 DIAGNOSIS — M3320 Polymyositis, organ involvement unspecified: Secondary | ICD-10-CM | POA: Diagnosis not present

## 2017-10-07 DIAGNOSIS — M722 Plantar fascial fibromatosis: Secondary | ICD-10-CM

## 2017-10-07 DIAGNOSIS — Z87448 Personal history of other diseases of urinary system: Secondary | ICD-10-CM

## 2017-10-07 DIAGNOSIS — M19071 Primary osteoarthritis, right ankle and foot: Secondary | ICD-10-CM

## 2017-10-07 NOTE — Patient Instructions (Signed)
Natural anti-inflammatories  You can purchase these at [employer], [employer] or online.  . Turmeric (capsules)  . Ginger (ginger root or capsules)  . Omega 3 (Fish, flax seeds, chia seeds, walnuts, almonds)  . Tart cherry (dried or extract)   Patient should be under the care of a physician while taking these supplements. This may not be reproduced without the permission of Dr. Shaili Deveshwar.  

## 2017-10-08 LAB — COMPLETE METABOLIC PANEL WITH GFR
AG RATIO: 1.3 (calc) (ref 1.0–2.5)
ALT: 8 U/L (ref 6–29)
AST: 14 U/L (ref 10–35)
Albumin: 3.7 g/dL (ref 3.6–5.1)
Alkaline phosphatase (APISO): 75 U/L (ref 33–115)
BUN/Creatinine Ratio: 7 (calc) (ref 6–22)
BUN: 11 mg/dL (ref 7–25)
CALCIUM: 9.1 mg/dL (ref 8.6–10.2)
CO2: 25 mmol/L (ref 20–32)
Chloride: 107 mmol/L (ref 98–110)
Creat: 1.51 mg/dL — ABNORMAL HIGH (ref 0.50–1.10)
GFR, EST AFRICAN AMERICAN: 47 mL/min/{1.73_m2} — AB (ref >=60)
GFR, EST NON AFRICAN AMERICAN: 40 mL/min/{1.73_m2} — AB (ref >=60)
GLUCOSE: 102 mg/dL — AB (ref 65–99)
Globulin: 2.8 g/dL (calc) (ref 1.9–3.7)
Potassium: 4.1 mmol/L (ref 3.5–5.3)
Sodium: 139 mmol/L (ref 135–146)
TOTAL PROTEIN: 6.5 g/dL (ref 6.1–8.1)
Total Bilirubin: 0.4 mg/dL (ref 0.2–1.2)

## 2017-10-08 LAB — CBC WITH DIFFERENTIAL/PLATELET
BASOS PCT: 0.5 %
Basophils Absolute: 33 cells/uL (ref 0–200)
EOS ABS: 20 {cells}/uL (ref 15–500)
Eosinophils Relative: 0.3 %
HCT: 37.8 % (ref 35.0–45.0)
HEMOGLOBIN: 12.6 g/dL (ref 11.7–15.5)
Lymphs Abs: 1201 cells/uL (ref 850–3900)
MCH: 27.9 pg (ref 27.0–33.0)
MCHC: 33.3 g/dL (ref 32.0–36.0)
MCV: 83.6 fL (ref 80.0–100.0)
MONOS PCT: 5.7 %
MPV: 9.6 fL (ref 7.5–12.5)
NEUTROS ABS: 4970 {cells}/uL (ref 1500–7800)
Neutrophils Relative %: 75.3 %
PLATELETS: 253 10*3/uL (ref 140–400)
RBC: 4.52 10*6/uL (ref 3.80–5.10)
RDW: 12.5 % (ref 11.0–15.0)
TOTAL LYMPHOCYTE: 18.2 %
WBC: 6.6 10*3/uL (ref 3.8–10.8)
WBCMIX: 376 {cells}/uL (ref 200–950)

## 2017-10-08 LAB — CK: Total CK: 104 U/L (ref 29–143)

## 2017-10-08 NOTE — Progress Notes (Signed)
CBC WNL. Creatinine elevated and GFR low. Please forward to PCP and nephrologist.  Please ask patient if she has been taking NSAIDs.  She should avoid all NSAIDs. CK WNL.

## 2017-10-12 ENCOUNTER — Other Ambulatory Visit: Payer: Self-pay | Admitting: Physician Assistant

## 2017-10-12 NOTE — Telephone Encounter (Signed)
Last Visit: 10/07/17 Next Visit: 03/10/18  Okay to refill per Dr. Deveshwar 

## 2017-10-25 ENCOUNTER — Other Ambulatory Visit: Payer: Self-pay

## 2017-10-25 ENCOUNTER — Emergency Department (HOSPITAL_COMMUNITY)
Admission: EM | Admit: 2017-10-25 | Discharge: 2017-10-25 | Disposition: A | Discharge status: Home / Self Care | Payer: BC Managed Care – PPO | Attending: Emergency Medicine | Admitting: Emergency Medicine

## 2017-10-25 ENCOUNTER — Encounter (HOSPITAL_COMMUNITY): Payer: Self-pay | Admitting: Emergency Medicine

## 2017-10-25 ENCOUNTER — Emergency Department (HOSPITAL_COMMUNITY): Payer: BC Managed Care – PPO

## 2017-10-25 DIAGNOSIS — R509 Fever, unspecified: Secondary | ICD-10-CM | POA: Diagnosis not present

## 2017-10-25 DIAGNOSIS — R0789 Other chest pain: Secondary | ICD-10-CM | POA: Diagnosis not present

## 2017-10-25 DIAGNOSIS — R079 Chest pain, unspecified: Secondary | ICD-10-CM | POA: Diagnosis present

## 2017-10-25 DIAGNOSIS — Z79899 Other long term (current) drug therapy: Secondary | ICD-10-CM | POA: Insufficient documentation

## 2017-10-25 DIAGNOSIS — N183 Chronic kidney disease, stage 3 (moderate): Secondary | ICD-10-CM | POA: Diagnosis not present

## 2017-10-25 DIAGNOSIS — M791 Myalgia, unspecified site: Secondary | ICD-10-CM

## 2017-10-25 LAB — URINALYSIS, ROUTINE W REFLEX MICROSCOPIC
BILIRUBIN URINE: NEGATIVE
Glucose, UA: NEGATIVE mg/dL
HGB URINE DIPSTICK: NEGATIVE
Ketones, ur: NEGATIVE mg/dL
LEUKOCYTES UA: NEGATIVE
NITRITE: NEGATIVE
PH: 5 (ref 5.0–8.0)
Protein, ur: NEGATIVE mg/dL
Specific Gravity, Urine: 1.019 (ref 1.005–1.030)

## 2017-10-25 LAB — CBC
HEMATOCRIT: 40.5 % (ref 36.0–46.0)
Hemoglobin: 12.7 g/dL (ref 12.0–15.0)
MCH: 27.9 pg (ref 26.0–34.0)
MCHC: 31.4 g/dL (ref 30.0–36.0)
MCV: 88.8 fL (ref 78.0–100.0)
PLATELETS: 244 10*3/uL (ref 150–400)
RBC: 4.56 MIL/uL (ref 3.87–5.11)
RDW: 13 % (ref 11.5–15.5)
WBC: 4.8 10*3/uL (ref 4.0–10.5)

## 2017-10-25 LAB — BASIC METABOLIC PANEL
Anion gap: 9 (ref 5–15)
BUN: 11 mg/dL (ref 6–20)
CO2: 22 mmol/L (ref 22–32)
CREATININE: 1.39 mg/dL — AB (ref 0.44–1.00)
Calcium: 9.1 mg/dL (ref 8.9–10.3)
Chloride: 110 mmol/L (ref 98–111)
GFR calc Af Amer: 51 mL/min — ABNORMAL LOW (ref >60)
GFR calc non Af Amer: 44 mL/min — ABNORMAL LOW (ref >60)
Glucose, Bld: 135 mg/dL — ABNORMAL HIGH (ref 70–99)
Potassium: 4.1 mmol/L (ref 3.5–5.1)
Sodium: 141 mmol/L (ref 135–145)

## 2017-10-25 LAB — HEPATIC FUNCTION PANEL
ALK PHOS: 65 U/L (ref 38–126)
ALT: 12 U/L (ref 0–44)
AST: 20 U/L (ref 15–41)
Albumin: 3.4 g/dL — ABNORMAL LOW (ref 3.5–5.0)
Bilirubin, Direct: <0.1 mg/dL (ref 0.0–0.2)
TOTAL PROTEIN: 6.9 g/dL (ref 6.5–8.1)
Total Bilirubin: 0.4 mg/dL (ref 0.3–1.2)

## 2017-10-25 LAB — I-STAT TROPONIN, ED: Troponin i, poc: 0.02 ng/mL (ref 0.00–0.08)

## 2017-10-25 LAB — I-STAT BETA HCG BLOOD, ED (MC, WL, AP ONLY): I-stat hCG, quantitative: <5 m[IU]/mL (ref <5)

## 2017-10-25 NOTE — ED Provider Notes (Signed)
[facility] EMERGENCY DEPARTMENT Provider Note   CSN: [id number] Arrival date & time: 10/25/17  1511     History   Chief Complaint Chief Complaint  Patient presents with  . Chest Pain  . Headache  . Chills  . Sweating    HPI Connie Brady is a 47 y.o. female.  Patient with a history of Sjoegren's, polymyositis, CKD presents with complaint of right sided chest pain associated with chills for the past 3-4 days. She feels she has had a fever but has not taken her temperature. No URI symptoms, cough, SOB, nausea, vomiting, diarrhea or urinary symptoms. No modifying factors. She has not taken anything for symptoms. She has had some right sided back discomfort and RUQ abdominal aching that is not associated with eating/drinking.   The history is provided by the patient. No language interpreter was used.  Chest Pain   Associated symptoms include abdominal pain (RUQ), back pain, a fever (See HPI.) and headaches. Pertinent negatives include no cough, no nausea, no shortness of breath, no vomiting and no weakness.  Headache   Associated symptoms include a fever (See HPI.). Pertinent negatives include no shortness of breath, no nausea and no vomiting.    Past Medical History:  Diagnosis Date  . Anemia   . Polymyositis ([place])   . Sjoegren syndrome     Patient Active Problem List   Diagnosis Date Noted  . Left lateral knee pain 08/24/2017  . S/P arthroscopic surgery of left knee 08/24/2017  . Abnormal cervical Papanicolaou smear 08/31/2016  . Obesity 08/31/2016  . Premenstrual symptom 08/31/2016  . Vitamin D deficiency 08/25/2016  . History of chronic kidney disease 05/07/2016  . Chronic kidney disease (CKD), stage III (moderate) ([place]) 03/30/2016  . High risk medication use 01/10/2016  . Primary osteoarthritis of both hips 01/10/2016  . Primary osteoarthritis of both knees 01/10/2016  . Primary osteoarthritis of both feet 01/10/2016  . Bilateral plantar  fasciitis 01/10/2016  . Chest pain 01/26/2013  . Polymyositis ([place]) 01/26/2013  . Sjoegren syndrome     Past Surgical History:  Procedure Laterality Date  . BUNIONECTOMY Bilateral   . KNEE ARTHROPLASTY Left 2018   meniscal tear  . KNEE ARTHROSCOPY WITH LATERAL MENISECTOMY Left 08/24/2017   Procedure: LEFT KNEE ARTHROSCOPY WITH LATERAL MENISECTOMY, CHONDROPLASTY, ARTHROSCOPIC ASSISTED INTERNAL FIXATION LATERAL TIBIAL PLATEAU;  Surgeon: Sydnee Cabal, MD;  Location: [facility];  Service: Orthopedics;  Laterality: Left;  . KNEE SURGERY     right  . MUSCLE BIOPSY     r/thigh  . PLANTAR FASCIA SURGERY Right   . TOTAL SHOULDER ARTHROPLASTY Left 01/12/2017     OB History   None      Home Medications    Prior to Admission medications   Medication Sig Start Date End Date Taking? Authorizing Provider  azaTHIOprine (IMURAN) 50 MG tablet TAKE 1 TABLET(50 MG) BY MOUTH TWICE DAILY Patient taking differently: Take 50 mg by mouth daily.  08/24/17   Bo Merino, MD  Cholecalciferol (VITAMIN D PO) Take by mouth daily.    [provider]  diclofenac sodium (VOLTAREN) 1 % GEL diclofenac 1 % topical gel    [provider]  DULoxetine (CYMBALTA) 60 MG capsule duloxetine 60 mg capsule,delayed release  TK ONE C PO QAM    [provider]  esomeprazole (NEXIUM) 20 MG capsule Take 20 mg by mouth as needed.     [provider]  gabapentin (NEURONTIN) 300 MG capsule Take  1 capsule (300 mg total) by mouth 2 (two) times daily. Patient taking differently: Take 300 mg by mouth at bedtime.  06/15/17   Bo Merino, MD  lidocaine (XYLOCAINE) 5 % ointment as needed.  12/11/15   [provider]  LOPREEZA 1-0.5 MG tablet daily. 01/20/17   [provider]  naltrexone (DEPADE) 50 MG tablet TK SS TO 1 T PO QD 09/07/17   [provider]  pilocarpine (SALAGEN) 5 MG tablet TAKE 1 TABLET(5 MG) BY MOUTH THREE TIMES DAILY AS NEEDED  09/15/17   Bo Merino, MD  predniSONE (DELTASONE) 1 MG tablet TAKE 1 TABLET BY MOUTH DAILY WITH BREAKFAST 10/12/17   Bo Merino, MD  predniSONE (DELTASONE) 5 MG tablet Take 1 tablet (5 mg total) by mouth daily with breakfast. 09/22/17   Bo Merino, MD  traZODone (DESYREL) 100 MG tablet Take 200 mg by mouth at bedtime.  12/01/15   [provider]  zolpidem (AMBIEN) 10 MG tablet Take 10 mg by mouth at bedtime as needed.  12/02/15   [provider]    Family History Family History  Problem Relation Age of Onset  . Hypertension Sister   . Hypertension Brother   . Diabetes Brother   . Hypertension Sister   . Hypertension Sister   . Hypertension Brother   . Diabetes Brother     Social History Social History   Tobacco Use  . Smoking status: Never Smoker  . Smokeless tobacco: Never Used  Substance Use Topics  . Alcohol use: No  . Drug use: No     Allergies   Lyrica [pregabalin] and Percocet [oxycodone-acetaminophen]   Review of Systems Review of Systems  Constitutional: Positive for chills and fever (See HPI.).  HENT: Negative.  Negative for congestion and sore throat.   Respiratory: Negative.  Negative for cough and shortness of breath.   Cardiovascular: Positive for chest pain.  Gastrointestinal: Positive for abdominal pain (RUQ). Negative for nausea and vomiting.  Genitourinary: Negative.  Negative for dysuria.  Musculoskeletal: Positive for back pain.  Skin: Negative.   Neurological: Positive for headaches. Negative for weakness.     Physical Exam Updated Vital Signs BP 118/77   Pulse (!) 55   Temp 98.1 F (36.7 C)   Resp (!) 21   Ht 5\' 8"  (1.727 m)   Wt 99.8 kg   LMP 09/15/2017   SpO2 100%   BMI 33.45 kg/m   Physical Exam  Constitutional: She is oriented to person, place, and time. She appears well-developed and well-nourished. She does not appear ill.  HENT:  Head: Normocephalic.  Neck: Normal range of motion. Neck  supple.  Cardiovascular: Normal rate, regular rhythm and normal pulses.  No murmur heard. Pulmonary/Chest: Effort normal and breath sounds normal. She has no wheezes. She has no rhonchi. She has no rales.  Right sided chest tenderness that is very mild.  Abdominal: Soft. Bowel sounds are normal. There is tenderness (Very mild RUQ TTP.). There is no rebound and no guarding.  Genitourinary:  Genitourinary Comments: Right sided back discomfort to flank.  Musculoskeletal: Normal range of motion.       Right lower leg: Normal.       Left lower leg: Normal.  Neurological: She is alert and oriented to person, place, and time.  Skin: Skin is warm and dry. No rash noted.  Psychiatric: She has a normal mood and affect.  Nursing note and vitals reviewed.    ED Treatments / Results  Labs (all  labs ordered are listed, but only abnormal results are displayed) Labs Reviewed  BASIC METABOLIC PANEL - Abnormal; Notable for the following components:      Result Value   Glucose, Bld 135 (*)    Creatinine, Ser 1.39 (*)    GFR calc non Af Amer 44 (*)    GFR calc Af Amer 51 (*)    All other components within normal limits  CBC  URINALYSIS, ROUTINE W REFLEX MICROSCOPIC  I-STAT TROPONIN, ED  I-STAT BETA HCG BLOOD, ED ([facility], [facility], [facility] ONLY)    EKG None  Radiology Dg Chest 2 View  Result Date: 10/25/2017 CLINICAL DATA:  One day history of left-sided chest pain associated with chills. EXAM: CHEST - 2 VIEW COMPARISON:  PA and lateral chest x-ray of April 22, 2011. FINDINGS: The lungs are reasonably well inflated. There is no focal infiltrate. There is no pleural effusion. The heart and pulmonary vascularity are normal. The mediastinum is normal in width. The trachea is midline. The bony thorax exhibits no acute abnormality. There is gentle dextrocurvature centered in the lower thoracic spine which is stable. IMPRESSION: There is no pneumonia nor other acute cardiopulmonary abnormality. Electronically Signed    By: David  Martinique M.D.   On: 10/25/2017 16:06    Procedures Procedures (including critical care time)  Medications Ordered in ED Medications - No data to display   Initial Impression / Assessment and Plan / ED Course  I have reviewed the triage vital signs and the nursing notes.  Pertinent labs & imaging results that were available during my care of the patient were reviewed by me and considered in my medical decision making (see chart for details).     Patient having presents with right chest pain, chills, subjective fever for several days. No cough, nausea, URI or urinary symptoms.   Labs and CXR are unremarkable. Cr. 1.39 c/w baseline. Likely viral process. She is encouraged to follow up with PCP in 3 days for recheck if symptoms persist.   Final Clinical Impressions(s) / ED Diagnoses   Final diagnoses:  None   1. Myalgia   ED Discharge Orders    None       Charlann Lange, Hershal Coria 10/25/17 McFarlan, Steele, DO 10/25/17 [id number]

## 2017-10-25 NOTE — Discharge Instructions (Addendum)
Recommend Tylenol as needed for any aches or discomfort. If symptoms worsen - you develop a high fever, severe pain or new concern - please return to the emergency department.

## 2017-10-25 NOTE — ED Triage Notes (Signed)
Pt presents with chills, sweating, aches that began on Friday; developed CP today; R sided achy pain in the chest with palpitations

## 2017-10-26 ENCOUNTER — Telehealth: Payer: Self-pay | Admitting: Rheumatology

## 2017-10-26 MED ORDER — PREDNISONE 5 MG PO TABS: 5.0000 mg | ORAL_TABLET | Freq: Every day | ORAL | 0 refills | Status: DC

## 2017-10-26 NOTE — Telephone Encounter (Signed)
Last Visit: 10/07/17 Next Visit: 03/10/18  Okay to refill per Dr. Deveshwar 

## 2017-10-26 NOTE — Telephone Encounter (Signed)
Patient called requesting prescription refill of Prednisone 5 mg to be sent to [facility] on [address].

## 2017-11-03 ENCOUNTER — Other Ambulatory Visit: Payer: Self-pay | Admitting: Gastroenterology

## 2017-11-03 DIAGNOSIS — R933 Abnormal findings on diagnostic imaging of other parts of digestive tract: Secondary | ICD-10-CM

## 2017-11-04 ENCOUNTER — Ambulatory Visit (HOSPITAL_COMMUNITY)
Admission: RE | Admit: 2017-11-04 | Discharge: 2017-11-04 | Disposition: A | Discharge status: Home / Self Care | Payer: BC Managed Care – PPO | Source: Ambulatory Visit | Attending: Gastroenterology | Admitting: Gastroenterology

## 2017-11-04 DIAGNOSIS — R933 Abnormal findings on diagnostic imaging of other parts of digestive tract: Secondary | ICD-10-CM | POA: Insufficient documentation

## 2017-12-01 ENCOUNTER — Other Ambulatory Visit: Payer: Self-pay | Admitting: Rheumatology

## 2017-12-02 NOTE — Telephone Encounter (Signed)
Last Visit: 10/07/17 Next Visit: 03/10/18  Okay to refill per Dr. Deveshwar 

## 2017-12-27 ENCOUNTER — Other Ambulatory Visit: Payer: Self-pay | Admitting: Rheumatology

## 2017-12-27 NOTE — Telephone Encounter (Signed)
Last Visit: 10/07/17 Next Visit: 03/10/18  Okay to refill per Dr. Estanislado Pandy

## 2017-12-29 ENCOUNTER — Other Ambulatory Visit: Payer: Self-pay | Admitting: Rheumatology

## 2017-12-29 NOTE — Telephone Encounter (Signed)
Last Visit: 10/07/17 Next Visit: 03/10/18  Okay to refill per Dr. Estanislado Pandy

## 2018-01-03 ENCOUNTER — Encounter: Payer: BC Managed Care – PPO | Attending: Family Medicine | Admitting: Registered"

## 2018-01-03 ENCOUNTER — Encounter: Payer: Self-pay | Admitting: Registered"

## 2018-01-03 DIAGNOSIS — Z713 Dietary counseling and surveillance: Secondary | ICD-10-CM | POA: Diagnosis not present

## 2018-01-03 DIAGNOSIS — E669 Obesity, unspecified: Secondary | ICD-10-CM | POA: Diagnosis present

## 2018-01-03 NOTE — Progress Notes (Signed)
Medical Nutrition Therapy:  Appt start time: 8:00 end time:  8:46.   Assessment:  Primary concerns today: Pt states she takes an appetite suppressant sometimes due to prednisone increasing appetite. Pt states she wants to get into better health and learn to eat better in a good, sound way. Pt states she knows what to do but needs help in finding ways to change her eating habits. Pt states she eats fast food and french fries often during the work week. Pt states she does not know what will satisfy her as much as junk food and soda. Pt states she loves mountain dew.   Pt expectations: proper way to eat, what she should be eating, and to help lose weight   Pt states she is showing early signs of kidney failure and wants to take some pressure off knees. Pt states she has recently increased physical activity with weight lifting. Pt states it is tough due to polymyositis and she is trying to regulate it. Pt   Pt states she lives with son and 2 brothers. Pt states she works 10 hrs/day, 7 days/week 7am-5pm and does not feel like eating or cooking when she gets off work. Pt states she works 7 days on/7 days/off. Pt states she cooks daily on her days off. Pt states she used to play basketball.   Pt states there are a lot of vegetables that she does not like. Pt likes: cabbage, salad (will eat it, but does not prefer it), green beans, broccoli, and collard greens.   Preferred Learning Style:   No preference indicated   Learning Readiness:   Contemplating  Ready  Change in progress   MEDICATIONS: See list   DIETARY INTAKE:  Usual eating pattern includes 2-3 meals and 3-4 snacks per day.  Everyday foods include fried foods, starches, cereal, fruit, sweets.  Avoided foods include vegetables.    24-hr recall:  B (6 AM): bacon + eggs + coffee/tea + bread (sometimes)  Snk (9 AM): soda + chips or peanut butter crackers + water  Snk (11 AM): fruit or crackers L (12-1 PM): [employer]-chicken  wings + fries + soda Snk (3 PM): cookies or cake D (5 PM): typically skips or sometimes chicken/pork chop + rice/potatoes/fries  Snk (9 PM): cereal or meat from dinner Beverages: coffee, tea, soda, water (if any, 1 bottle), juice  Usual physical activity: weight lifting 2x/week  Estimated energy needs: 1800-2000 calories 200-225 g carbohydrates 135-150 g protein 50-56 g fat  Progress Towards Goal(s):  In progress.   Nutritional Diagnosis:  NI-5.8.5 Inadeqate fiber intake As related to balanced meals.  As evidenced by dietary recall.    Intervention:  Nutrition education and counseling. Pt was educated and counseled nutrition related to reflux, physical activity, fiber, ways to increase fiber intake, eating balanced meals, and benefits of increasing water intake. Pt was in agreement with goals listed.  Goals: - Aim to add in cardio such as water activities to help reduce pressure on joints when exercising.  - Increase fiber intake by adding in vegetables with lunch and dinner.  - Add protein option with fruit such as nuts, cheese, greek yogurt, peanut butter, etc.  - Aim to increase water intake to at least 2 bottles a day.   - Aim to have 3 meals a day. Include dinner.   Teaching Method Utilized:  Visual Auditory Hands on  Handouts given during visit include:  none  Barriers to learning/adherence to lifestyle change: work-life balance  Demonstrated degree of  understanding via:  Teach Back   Monitoring/Evaluation:  Dietary intake, exercise, and body weight in 1 month(s).

## 2018-01-03 NOTE — Patient Instructions (Addendum)
-   Aim to add in cardio such as water activities to help reduce pressure on joints when exercising.   - Increase fiber intake by adding in vegetables with lunch and dinner.   - Add protein option with fruit such as nuts, cheese, greek yogurt, peanut butter, etc.   - Aim to increase water intake to at least 2 bottles a day.    - Aim to have 3 meals a day. Include dinner.

## 2018-01-05 ENCOUNTER — Other Ambulatory Visit: Payer: Self-pay | Admitting: Rheumatology

## 2018-01-05 NOTE — Telephone Encounter (Signed)
Last Visit: 10/07/17 Next Visit: 03/10/18  Okay to refill per Dr. Estanislado Pandy

## 2018-01-17 ENCOUNTER — Other Ambulatory Visit: Payer: Self-pay | Admitting: Rheumatology

## 2018-01-18 NOTE — Telephone Encounter (Signed)
Last Visit: 10/07/17 Next Visit: 03/10/18  Okay to refill per Dr. Estanislado Pandy

## 2018-01-31 ENCOUNTER — Encounter: Payer: BC Managed Care – PPO | Attending: Family Medicine | Admitting: Registered"

## 2018-01-31 ENCOUNTER — Encounter: Payer: Self-pay | Admitting: Registered"

## 2018-01-31 ENCOUNTER — Other Ambulatory Visit: Payer: Self-pay | Admitting: Rheumatology

## 2018-01-31 DIAGNOSIS — Z713 Dietary counseling and surveillance: Secondary | ICD-10-CM | POA: Diagnosis not present

## 2018-01-31 DIAGNOSIS — E669 Obesity, unspecified: Secondary | ICD-10-CM | POA: Diagnosis present

## 2018-01-31 NOTE — Progress Notes (Signed)
Medical Nutrition Therapy:  Appt start time: 8:00 end time:  8:40.   Assessment:  Primary concerns today:  Pt expectations: proper way to eat, what she should be eating, and to help lose weight   Pt states since previous visit she has struggled with vegetables; had more at lunch than dinner because they are readily available. Pt states most days she struggled with what to eat. Pt states recently her son has eliminated sweets and red meat but she feels he needs to gain weight.   Pt states she did not exercise as much since previous visit because she was injured from strength training. Pt states she plans to start walking with brother this week. Pt states she is an "all or nothing" person with a lot of things. Pt states she did good with water by increasing to 2-3 bottles a day from 1 bottle (if any); in the process of reducing sweet tea, soda, and coffee consumption because she recognizes that it makes her dehydrated.   Pt states she is showing early signs of kidney failure and wants to take some pressure off knees. Pt states she has recently increased physical activity with weight lifting. Pt states it is tough due to polymyositis and she is trying to regulate it.   Pt states she lives with son and 2 brothers. Pt states she works 10 hrs/day, 7 days/week 7am-5pm and does not feel like eating or cooking when she gets off work. Pt states she works 7 days on/7 days off. Pt states she cooks daily on her days off. Pt states she used to play basketball.   Pt states there are a lot of vegetables that she does not like. Pt likes: cabbage, salad (will eat it, but does not prefer it), green beans, broccoli, and collard greens.   Preferred Learning Style:   No preference indicated   Learning Readiness:   Contemplating  Ready  Change in progress   MEDICATIONS: See list   DIETARY INTAKE:  Usual eating pattern includes 2-3 meals and 3-4 snacks per day.  Everyday foods include fried foods,  starches, cereal, fruit, sweets.  Avoided foods include vegetables.    24-hr recall:  B (6 AM): bacon + eggs + toast + coffee/tea  Snk (9 AM): apples  L (12-1 PM): chicken wing + small amount lo mein or  salad Snk (3 PM): pumpkin spice cake D (5 PM): fried fish + baked beans + creamed potatoes  Beverages: coffee, water (2-3 bottles), juice  Usual physical activity: weight lifting 2x/week  Estimated energy needs: 1800-2000 calories 200-225 g carbohydrates 135-150 g protein 50-56 g fat  Progress Towards Goal(s):  In progress.   Nutritional Diagnosis:  NI-5.8.5 Inadeqate fiber intake As related to balanced meals.  As evidenced by dietary recall.    Intervention:  Nutrition education and counseling. Pt was educated and counseled on physical activity, ways to have nutrient-dense meals, meal planning, and benefits of increasing water intake. Pt has made positive behavioral changes since previous visit. Pt was in agreement with goals listed.  Goals: - Continue to work on increasing physical activity with cardio.   - Try using crock pot to help with preparing dinner.  - Continue to aim to have vegetables with lunch and dinner.  - Listen to your body.  - Add protein with fruit as snacks.  - Keep up the great work with eating 3 meals a day, increasing water intake, and having fruit as snacks.   Teaching Method Utilized:  Visual  Auditory Hands on  Handouts given during visit include:  none  Barriers to learning/adherence to lifestyle change: work-life balance  Demonstrated degree of understanding via:  Teach Back   Monitoring/Evaluation:  Dietary intake, exercise, and body weight in 1 month(s).

## 2018-01-31 NOTE — Patient Instructions (Signed)
-   Continue to work on increasing physical activity with cardio.    - Try using crock pot to help with preparing dinner.   - Continue to aim to have vegetables with lunch and dinner.   - Listen to your body.   - Add protein with fruit as snacks.   - Keep up the great work with eating 3 meals a day, increasing water intake, and having fruit as snacks.

## 2018-01-31 NOTE — Telephone Encounter (Signed)
Last Visit: 10/07/17 Next Visit: 03/10/18  Okay to refill per Dr. Estanislado Pandy

## 2018-02-24 NOTE — Progress Notes (Signed)
Office Visit Note  Patient: Connie Brady             Date of Birth: [birth year]           MRN: [id number]             PCP: Kelton Pillar, MD Referring: Kelton Pillar, MD Visit Date: 03/10/2018 Occupation: @GUAROCC @  Subjective:  Left knee and muscle pain.    History of Present Illness: Connie Brady is a 48 y.o. female history of polymyositis Sjogren's and osteoarthritis.  She states she has been experiencing lower back pain, left knee pain, pain in her hamstrings and calf muscles.  She has been experiencing increased weakness.  She states she has decreased prednisone to 6 mg p.o. daily and will be changing to 5 mg p.o. daily this week.  He continues to have dry mouth.  She states she has been taking Imuran only 1 tablet p.o. daily.  Activities of Daily Living:  Patient reports morning stiffness for 24 hours.   Patient Reports nocturnal pain.  Difficulty dressing/grooming: Denies Difficulty climbing stairs: Reports Difficulty getting out of chair: Reports Difficulty using hands for taps, buttons, cutlery, and/or writing: Denies  Review of Systems  Constitutional: Positive for fatigue.  HENT: Positive for mouth dryness. Negative for mouth sores, trouble swallowing, trouble swallowing and nose dryness.   Eyes: Negative for redness, itching and dryness.  Respiratory: Negative for shortness of breath, wheezing and difficulty breathing.   Cardiovascular: Negative for palpitations and irregular heartbeat.  Gastrointestinal: Negative for abdominal pain, constipation and diarrhea.  Endocrine: Positive for increased urination.  Genitourinary: Negative for painful urination, nocturia and pelvic pain.  Musculoskeletal: Positive for arthralgias, joint pain and morning stiffness. Negative for joint swelling.  Skin: Negative for rash and redness.  Allergic/Immunologic: Negative for susceptible to infections.  Neurological: Negative for dizziness, tremors, light-headedness,  headaches and memory loss.  Hematological: Negative for bruising/bleeding tendency.  Psychiatric/Behavioral: Negative for confusion. The patient is not nervous/anxious.     PMFS History:  Patient Active Problem List   Diagnosis Date Noted  . Left lateral knee pain 08/24/2017  . S/P arthroscopic surgery of left knee 08/24/2017  . Abnormal cervical Papanicolaou smear 08/31/2016  . Obesity 08/31/2016  . Premenstrual symptom 08/31/2016  . Vitamin D deficiency 08/25/2016  . History of chronic kidney disease 05/07/2016  . Chronic kidney disease (CKD), stage III (moderate) ([place]) 03/30/2016  . High risk medication use 01/10/2016  . Primary osteoarthritis of both hips 01/10/2016  . Primary osteoarthritis of both knees 01/10/2016  . Primary osteoarthritis of both feet 01/10/2016  . Bilateral plantar fasciitis 01/10/2016  . Chest pain 01/26/2013  . Polymyositis ([place]) 01/26/2013  . Sjoegren syndrome     Past Medical History:  Diagnosis Date  . Anemia   . Polymyositis ([place])   . Sjoegren syndrome     Family History  Problem Relation Age of Onset  . Hypertension Sister   . Hypertension Brother   . Diabetes Brother   . Hypertension Sister   . Hypertension Sister   . Hypertension Brother   . Diabetes Brother    Past Surgical History:  Procedure Laterality Date  . BUNIONECTOMY Bilateral   . KNEE ARTHROPLASTY Left 2018   meniscal tear  . KNEE ARTHROSCOPY WITH LATERAL MENISECTOMY Left 08/24/2017   Procedure: LEFT KNEE ARTHROSCOPY WITH LATERAL MENISECTOMY, CHONDROPLASTY, ARTHROSCOPIC ASSISTED INTERNAL FIXATION LATERAL TIBIAL PLATEAU;  Surgeon: Sydnee Cabal, MD;  Location: [facility];  Service: Orthopedics;  Laterality:  Left;  . KNEE SURGERY     right  . MUSCLE BIOPSY     r/thigh  . PLANTAR FASCIA SURGERY Right   . TOTAL SHOULDER ARTHROPLASTY Left 01/12/2017   Social History   Social History Narrative  . Not on file    Objective: Vital Signs: BP 113/74 (BP  Location: Left Arm, Patient Position: Sitting, Cuff Size: Normal)   Pulse 78   Resp 13   Ht 5\' 8"  (1.727 m)   Wt 222 lb 6.4 oz (100.9 kg)   BMI 33.82 kg/m    Physical Exam Vitals signs and nursing note reviewed.  Constitutional:      Appearance: She is well-developed.  HENT:     Head: Normocephalic and atraumatic.  Eyes:     Conjunctiva/sclera: Conjunctivae normal.  Neck:     Musculoskeletal: Normal range of motion.  Cardiovascular:     Rate and Rhythm: Normal rate and regular rhythm.     Heart sounds: Normal heart sounds.  Pulmonary:     Effort: Pulmonary effort is normal.     Breath sounds: Normal breath sounds.  Abdominal:     General: Bowel sounds are normal.     Palpations: Abdomen is soft.  Lymphadenopathy:     Cervical: No cervical adenopathy.  Skin:    General: Skin is warm and dry.     Capillary Refill: Capillary refill takes less than 2 seconds.  Neurological:     Mental Status: She is alert and oriented to person, place, and time.  Psychiatric:        Behavior: Behavior normal.      Musculoskeletal Exam: C-spine thoracic lumbar spine good range of motion.  Shoulder joints elbow joints wrist joint MCPs PIPs DIPs with good range of motion with no synovitis.  Hip joints knee joints ankles MTPs PIPs been good range of motion.  She is tender discomfort range of motion of her left knee joint.  She also had tenderness on palpation over left calf.  CDAI Exam: CDAI Score: Not documented Patient Global Assessment: Not documented; Provider Global Assessment: Not documented Swollen: Not documented; Tender: Not documented Joint Exam   Not documented   There is currently no information documented on the homunculus. Go to the Rheumatology activity and complete the homunculus joint exam.  Investigation: No additional findings.  Imaging: Xr Knee 3 View Left  Result Date: 03/10/2018 Moderate lateral compartment narrowing was noted.  Sclerosis of the tibia on the  lateral aspect was noted.  No chondrocalcinosis was noted.  Moderate patellofemoral narrowing was noted. Impression: These findings are consistent with moderate osteoarthritis.  Sclerosis of the tibia was noted.   Recent Labs: Lab Results  Component Value Date   WBC 4.8 10/25/2017   HGB 12.7 10/25/2017   PLT 244 10/25/2017   NA 141 10/25/2017   K 4.1 10/25/2017   CL 110 10/25/2017   CO2 22 10/25/2017   GLUCOSE 135 (H) 10/25/2017   BUN 11 10/25/2017   CREATININE 1.39 (H) 10/25/2017   BILITOT 0.4 10/25/2017   ALKPHOS 65 10/25/2017   AST 20 10/25/2017   ALT 12 10/25/2017   PROT 6.9 10/25/2017   ALBUMIN 3.4 (L) 10/25/2017   CALCIUM 9.1 10/25/2017   GFRAA 51 (L) 10/25/2017    Speciality Comments: No specialty comments available.  Procedures:  Large Joint Inj: L knee on 03/10/2018 10:20 AM Indications: pain Details: 27 G 1.5 in needle, medial approach  Arthrogram: No  Medications: 1.5 mL lidocaine 1 %; 40 mg  triamcinolone acetonide 40 MG/ML Aspirate: 0 mL Outcome: tolerated well, no immediate complications Procedure, treatment alternatives, risks and benefits explained, specific risks discussed. Consent was given by the patient. Immediately prior to procedure a time out was called to verify the correct patient, procedure, equipment, support staff and site/side marked as required. Patient was prepped and draped in the usual sterile fashion.     Allergies: Lyrica [pregabalin] and Percocet [oxycodone-acetaminophen]   Assessment / Plan:     Visit Diagnoses: Polymyositis ([place]) - History of elevated CK, positive muscle biopsy, myositis panel negative, initial CK 522 -patient has been experiencing increased muscle pain.  I will obtain following labs today.  We also increased her dose of Imuran 200 mg p.o. daily which she did not increase and has been on 50 mg p.o. daily.  I emphasized increasing the dose of Imuran 200 mg p.o. daily.  After increased dose we will check labs in 2 weeks  then 2 months and then every 3 months.  She will stay on prednisone 7 mg p.o. daily for right now.  Once the labs are available and if they are normal I would taper prednisone by 1 mg every month.  Plan: CK, Sedimentation rate  High risk medication use - Imuran 50 mg daily and prednisone 7 mg daily.  Last CBC within normal limits on 10/25/2017.  Most recent CMP within normal limits except for elevated creatinine on 10/07/2017.  CBC/CMP ordered for today and will monitor every 3 months.  Standing orders are in place.Recommend annual influenza, Pneumovax 23, Prevnar 13, and Shingrix as indicated. - Plan: CBC with Differential/Platelet, COMPLETE METABOLIC PANEL WITH GFR  Sjogren's syndrome with keratoconjunctivitis sicca (HCC) - ANA 1:40 centromere, +SSA. She takes Pilocarpine 5 mg TID PRN for sicca symptoms.  She continues to have some dry mouth.  Primary osteoarthritis of both hips-currently not having much discomfort.  Primary osteoarthritis of both knees -she has been experiencing increased pain in her left knee joint.  Plan: XR KNEE 3 VIEW LEFT.  Moderate osteoarthritis was noted.  Sclerosis of the tibia was noted.  She has been having significant discomfort in her left knee joint.  I will schedule MRI of her left knee joint.  After informed consent were obtained right knee joint was injected with cortisone as described above.  She tolerated the procedure well.  Left calf pain-I will schedule ultrasound to rule out DVT.  Primary osteoarthritis of both feet-she continues to have chronic pain.  DDD (degenerative disc disease), lumbar-weight loss diet and exercise was discussed.  She likes to use lidocaine ointment which was refilled.  On prednisone therapy  Gastroesophageal reflux disease, esophagitis presence not specified  History of chronic kidney disease - Followed up by Dr. Moshe Cipro   History of vitamin D deficiency   Orders: Orders Placed This Encounter  Procedures  . Large Joint  Inj: L knee  . XR KNEE 3 VIEW LEFT  . [place] Extrem Low Left Comp  . CBC with Differential/Platelet  . COMPLETE METABOLIC PANEL WITH GFR  . CK  . Sedimentation rate   Meds ordered this encounter  Medications  . predniSONE (DELTASONE) 1 MG tablet    Sig: Take 2 tablets (2mg ) daily with 5 mg tablet to equal to 7mg     Dispense:  180 tablet    Refill:  0  . lidocaine (XYLOCAINE) 5 % ointment    Sig: Apply to affected area daily as needed    Dispense:  35.44 g    Refill:  5    Face-to-face time spent with patient was 30 minutes. Greater than 50% of time was spent in counseling and coordination of care.  Follow-Up Instructions: Return for Polymyositis, Sjogren's, osteoarthritis.   Bo Merino, MD  Note - This record has been created using Editor, commissioning.  Chart creation errors have been sought, but may not always  have been located. Such creation errors do not reflect on  the standard of medical care.

## 2018-03-03 ENCOUNTER — Other Ambulatory Visit: Payer: Self-pay | Admitting: Rheumatology

## 2018-03-03 NOTE — Telephone Encounter (Signed)
Last Visit: 10/07/17 Next Visit: 03/10/18  Okay to refill per Dr. Estanislado Pandy

## 2018-03-03 NOTE — Telephone Encounter (Addendum)
Last Visit: 10/07/17 Next Visit: 03/10/18 Labs: 12/17/17   Okay to refill per Dr. Estanislado Pandy

## 2018-03-10 ENCOUNTER — Ambulatory Visit (INDEPENDENT_AMBULATORY_CARE_PROVIDER_SITE_OTHER): Payer: Self-pay

## 2018-03-10 ENCOUNTER — Encounter: Payer: Self-pay | Admitting: Rheumatology

## 2018-03-10 ENCOUNTER — Ambulatory Visit: Payer: BC Managed Care – PPO | Admitting: Rheumatology

## 2018-03-10 ENCOUNTER — Ambulatory Visit (INDEPENDENT_AMBULATORY_CARE_PROVIDER_SITE_OTHER): Payer: BC Managed Care – PPO

## 2018-03-10 ENCOUNTER — Ambulatory Visit (HOSPITAL_COMMUNITY)
Admission: RE | Admit: 2018-03-10 | Discharge: 2018-03-10 | Disposition: A | Discharge status: Home / Self Care | Payer: BC Managed Care – PPO | Source: Ambulatory Visit | Attending: Family | Admitting: Family

## 2018-03-10 VITALS — BP 113/74 | HR 78 | Resp 13 | Ht 68.0 in | Wt 222.4 lb

## 2018-03-10 DIAGNOSIS — M3320 Polymyositis, organ involvement unspecified: Secondary | ICD-10-CM | POA: Diagnosis not present

## 2018-03-10 DIAGNOSIS — M3501 Sicca syndrome with keratoconjunctivitis: Secondary | ICD-10-CM | POA: Diagnosis not present

## 2018-03-10 DIAGNOSIS — Z79899 Other long term (current) drug therapy: Secondary | ICD-10-CM | POA: Diagnosis not present

## 2018-03-10 DIAGNOSIS — M79662 Pain in left lower leg: Secondary | ICD-10-CM

## 2018-03-10 DIAGNOSIS — Z8639 Personal history of other endocrine, nutritional and metabolic disease: Secondary | ICD-10-CM

## 2018-03-10 DIAGNOSIS — Z87448 Personal history of other diseases of urinary system: Secondary | ICD-10-CM

## 2018-03-10 DIAGNOSIS — M170 Bilateral primary osteoarthritis of knee: Secondary | ICD-10-CM

## 2018-03-10 DIAGNOSIS — M5136 Other intervertebral disc degeneration, lumbar region: Secondary | ICD-10-CM

## 2018-03-10 DIAGNOSIS — M19072 Primary osteoarthritis, left ankle and foot: Secondary | ICD-10-CM

## 2018-03-10 DIAGNOSIS — M19071 Primary osteoarthritis, right ankle and foot: Secondary | ICD-10-CM

## 2018-03-10 DIAGNOSIS — M160 Bilateral primary osteoarthritis of hip: Secondary | ICD-10-CM

## 2018-03-10 DIAGNOSIS — Z7952 Long term (current) use of systemic steroids: Secondary | ICD-10-CM

## 2018-03-10 DIAGNOSIS — G8929 Other chronic pain: Secondary | ICD-10-CM

## 2018-03-10 DIAGNOSIS — M25562 Pain in left knee: Secondary | ICD-10-CM

## 2018-03-10 DIAGNOSIS — K219 Gastro-esophageal reflux disease without esophagitis: Secondary | ICD-10-CM

## 2018-03-10 MED ORDER — TRIAMCINOLONE ACETONIDE 40 MG/ML IJ SUSP
40.0000 mg | INTRAMUSCULAR | Status: AC | PRN
  Administered 2018-03-10: 40 mg via INTRA_ARTICULAR

## 2018-03-10 MED ORDER — PREDNISONE 1 MG PO TABS: ORAL_TABLET | ORAL | 0 refills | Status: DC

## 2018-03-10 MED ORDER — LIDOCAINE 5 % EX OINT: TOPICAL_OINTMENT | CUTANEOUS | 5 refills | Status: DC

## 2018-03-10 MED ORDER — LIDOCAINE HCL 1 % IJ SOLN
1.5000 mL | INTRAMUSCULAR | Status: AC | PRN
  Administered 2018-03-10: 1.5 mL

## 2018-03-10 NOTE — Progress Notes (Signed)
Dr. Estanislado Pandy received a phone call from [facility] at 12:06pm, ultrasound is negative for DVT.

## 2018-03-10 NOTE — Patient Instructions (Addendum)
**  Please increase Imuran to 2 tablets (1000 mg) twice daily with food.**  **Continue prednisone 7 mg total daily. (5mg  tablet with two 1 mg tablets at breakfast)**  Standing Labs We placed an order today for your standing lab work.    Please come back and get your standing labs in 2 weeks after increasing the dose of Imuran then in April and then every 3 months.  We have open lab Monday through Friday from 8:30-11:30 AM and 1:30-4:00 PM  at the office of Dr. Bo Merino.   You may experience shorter wait times on Monday and Friday afternoons. The office is located at [address] [place], [place], [place] [address] No appointment is necessary.   Labs are drawn by [facility].  You may receive a bill from [facility] for your lab work.  If you wish to have your labs drawn at another location, please call the office 24 hours in advance to send orders.  If you have any questions regarding directions or hours of operation,  please call [phone].   Just as a reminder please drink plenty of water prior to coming for your lab work. Thanks!

## 2018-03-11 ENCOUNTER — Ambulatory Visit: Payer: BC Managed Care – PPO | Admitting: Registered"

## 2018-03-11 LAB — COMPLETE METABOLIC PANEL WITH GFR
AG Ratio: 1.2 (calc) (ref 1.0–2.5)
ALKALINE PHOSPHATASE (APISO): 64 U/L (ref 33–115)
ALT: 8 U/L (ref 6–29)
AST: 13 U/L (ref 10–35)
Albumin: 3.7 g/dL (ref 3.6–5.1)
BUN/Creatinine Ratio: 10 (calc) (ref 6–22)
BUN: 12 mg/dL (ref 7–25)
CO2: 28 mmol/L (ref 20–32)
CREATININE: 1.22 mg/dL — AB (ref 0.50–1.10)
Calcium: 8.9 mg/dL (ref 8.6–10.2)
Chloride: 105 mmol/L (ref 98–110)
GFR, EST NON AFRICAN AMERICAN: 52 mL/min/{1.73_m2} — AB (ref >=60)
GFR, Est African American: 60 mL/min/{1.73_m2} (ref >=60)
GLUCOSE: 95 mg/dL (ref 65–99)
Globulin: 3 g/dL (calc) (ref 1.9–3.7)
Potassium: 4.2 mmol/L (ref 3.5–5.3)
Sodium: 139 mmol/L (ref 135–146)
Total Bilirubin: 0.5 mg/dL (ref 0.2–1.2)
Total Protein: 6.7 g/dL (ref 6.1–8.1)

## 2018-03-11 LAB — CBC WITH DIFFERENTIAL/PLATELET
Absolute Monocytes: 347 cells/uL (ref 200–950)
BASOS ABS: 31 {cells}/uL (ref 0–200)
Basophils Relative: 0.5 %
EOS ABS: 12 {cells}/uL — AB (ref 15–500)
EOS PCT: 0.2 %
HEMATOCRIT: 38.8 % (ref 35.0–45.0)
Hemoglobin: 12.9 g/dL (ref 11.7–15.5)
Lymphs Abs: 1885 cells/uL (ref 850–3900)
MCH: 28.3 pg (ref 27.0–33.0)
MCHC: 33.2 g/dL (ref 32.0–36.0)
MCV: 85.1 fL (ref 80.0–100.0)
MONOS PCT: 5.6 %
MPV: 10 fL (ref 7.5–12.5)
NEUTROS ABS: 3925 {cells}/uL (ref 1500–7800)
NEUTROS PCT: 63.3 %
PLATELETS: 240 10*3/uL (ref 140–400)
RBC: 4.56 10*6/uL (ref 3.80–5.10)
RDW: 12.6 % (ref 11.0–15.0)
TOTAL LYMPHOCYTE: 30.4 %
WBC: 6.2 10*3/uL (ref 3.8–10.8)

## 2018-03-11 LAB — CK: CK TOTAL: 135 U/L (ref 29–143)

## 2018-03-11 LAB — SEDIMENTATION RATE: SED RATE: 25 mm/h — AB (ref 0–20)

## 2018-03-11 NOTE — Progress Notes (Signed)
Labs are stable.  CK is within normal limits.  Sed rate is mildly elevated.  I discussed with patient yesterday to increase Imuran and his stay on prednisone 7 mg p.o. daily.  She should get repeat labs in 2 weeks after increasing her Imuran dose.

## 2018-03-15 ENCOUNTER — Ambulatory Visit (HOSPITAL_COMMUNITY)
Admission: RE | Admit: 2018-03-15 | Discharge: 2018-03-15 | Disposition: A | Discharge status: Home / Self Care | Payer: BC Managed Care – PPO | Source: Ambulatory Visit | Attending: Rheumatology | Admitting: Rheumatology

## 2018-03-15 DIAGNOSIS — M25562 Pain in left knee: Secondary | ICD-10-CM | POA: Diagnosis present

## 2018-03-15 DIAGNOSIS — G8929 Other chronic pain: Secondary | ICD-10-CM | POA: Insufficient documentation

## 2018-03-24 ENCOUNTER — Other Ambulatory Visit: Payer: Self-pay

## 2018-03-24 DIAGNOSIS — Z79899 Other long term (current) drug therapy: Secondary | ICD-10-CM

## 2018-03-24 LAB — COMPLETE METABOLIC PANEL WITH GFR
AG Ratio: 1.3 (calc) (ref 1.0–2.5)
ALBUMIN MSPROF: 3.5 g/dL — AB (ref 3.6–5.1)
ALKALINE PHOSPHATASE (APISO): 65 U/L (ref 31–125)
ALT: 11 U/L (ref 6–29)
AST: 14 U/L (ref 10–35)
BUN / CREAT RATIO: 10 (calc) (ref 6–22)
BUN: 13 mg/dL (ref 7–25)
CHLORIDE: 105 mmol/L (ref 98–110)
CO2: 29 mmol/L (ref 20–32)
CREATININE: 1.25 mg/dL — AB (ref 0.50–1.10)
Calcium: 8.9 mg/dL (ref 8.6–10.2)
GFR, Est African American: 58 mL/min/{1.73_m2} — ABNORMAL LOW (ref >=60)
GFR, Est Non African American: 50 mL/min/{1.73_m2} — ABNORMAL LOW (ref >=60)
GLUCOSE: 73 mg/dL (ref 65–99)
Globulin: 2.8 g/dL (calc) (ref 1.9–3.7)
Potassium: 4.1 mmol/L (ref 3.5–5.3)
Sodium: 139 mmol/L (ref 135–146)
Total Bilirubin: 0.5 mg/dL (ref 0.2–1.2)
Total Protein: 6.3 g/dL (ref 6.1–8.1)

## 2018-03-24 LAB — CBC WITH DIFFERENTIAL/PLATELET
Absolute Monocytes: 573 cells/uL (ref 200–950)
BASOS ABS: 17 {cells}/uL (ref 0–200)
Basophils Relative: 0.2 %
EOS PCT: 0.1 %
Eosinophils Absolute: 8 cells/uL — ABNORMAL LOW (ref 15–500)
HEMATOCRIT: 38.5 % (ref 35.0–45.0)
Hemoglobin: 12.7 g/dL (ref 11.7–15.5)
LYMPHS ABS: 2316 {cells}/uL (ref 850–3900)
MCH: 28.4 pg (ref 27.0–33.0)
MCHC: 33 g/dL (ref 32.0–36.0)
MCV: 86.1 fL (ref 80.0–100.0)
MPV: 9.7 fL (ref 7.5–12.5)
Monocytes Relative: 6.9 %
NEUTROS PCT: 64.9 %
Neutro Abs: 5387 cells/uL (ref 1500–7800)
Platelets: 268 10*3/uL (ref 140–400)
RBC: 4.47 10*6/uL (ref 3.80–5.10)
RDW: 12.8 % (ref 11.0–15.0)
Total Lymphocyte: 27.9 %
WBC: 8.3 10*3/uL (ref 3.8–10.8)

## 2018-03-25 NOTE — Progress Notes (Signed)
CK 135 which is normal. Cr is higher. Pl forward labs to her nephrologist.

## 2018-03-29 ENCOUNTER — Ambulatory Visit: Payer: BC Managed Care – PPO | Admitting: Registered"

## 2018-04-04 ENCOUNTER — Other Ambulatory Visit: Payer: Self-pay | Admitting: Rheumatology

## 2018-04-04 NOTE — Telephone Encounter (Signed)
Last Visit: 03/10/18 Next visit: 06/13/18 Labs: 03/24/18 CK 135 which is normal. Cr is higher  Okay to refill per Dr. Estanislado Pandy

## 2018-04-09 ENCOUNTER — Other Ambulatory Visit: Payer: Self-pay | Admitting: Rheumatology

## 2018-04-11 NOTE — Telephone Encounter (Signed)
Last Visit: 03/10/18 Next visit: 06/13/18  Okay to refill per Dr. Estanislado Pandy

## 2018-04-20 ENCOUNTER — Encounter: Payer: BC Managed Care – PPO | Attending: Family Medicine | Admitting: Registered"

## 2018-04-20 DIAGNOSIS — E669 Obesity, unspecified: Secondary | ICD-10-CM

## 2018-04-20 DIAGNOSIS — Z713 Dietary counseling and surveillance: Secondary | ICD-10-CM | POA: Diagnosis not present

## 2018-04-20 NOTE — Patient Instructions (Signed)
-   Aim to have snacks that include carbohydrates and protein.   - Try not to skip dinner.  - Meet nutritional needs with balanced meals (including protein, carbohydrates, and non-starchy vegetables) 3 times a day.   - Challenge diet culture messages.

## 2018-04-20 NOTE — Progress Notes (Signed)
Medical Nutrition Therapy:  Appt start time: 11:30 end time:  12:15.   Assessment:  Primary concerns today:  Medical history: polymyositis, CKD (stage III)  Pt expectations: proper way to eat, what she should be eating, and to help lose weight   Pt states she has been experiencing pre-menopause symptoms; no menstrual cycle in 2 months. Pt states she was eating a pint of ice cream daily last week. Pt states she has been drinking more water, lack of sleep, and taking prednisone. Pt states she her body has a hard time building muscles back up, she still feels weak at times, fears losing muscle and that's they she wants to strength train even though it can be too much for her at times. Pt states she is stressed about her health and frustration leads to emotional eating. Pt reports eating ice cream is bad because its not positive for her health. Pt states she gets angry with herself after eating a pint of ice cream and will do it again.   Pt has increased vegetable intake from previous visit. Pt reports history of dieting.   Previous visit: Pt states she is showing early signs of kidney failure and wants to take some pressure off knees. Pt states she lives with son and 2 brothers. Pt states she works 10 hrs/day, 7 days/week 7am-5pm and does not feel like eating or cooking when she gets off work. Pt states she works 7 days on/7 days off. Pt states she cooks daily on her days off. Pt states she used to play basketball.   Pt states there are a lot of vegetables that she does not like. Pt likes: cabbage, salad (will eat it, but does not prefer it), green beans, broccoli, and collard greens.  Daughter graduates college May 2020.    Preferred Learning Style:   No preference indicated   Learning Readiness:   Contemplating  Ready  Change in progress   MEDICATIONS: See list   DIETARY INTAKE:  Usual eating pattern includes 2-3 meals and 3-4 snacks per day.  Everyday foods include fried  foods, starches, cereal, fruit, sweets.  Avoided foods include vegetables.    24-hr recall:  B (5:30-6 AM): bacon/sausage + eggs + toast (sometimes) + coffee/tea  Snk (9 AM): walnuts + cranberries or apples or soda + chips  L (12-1 PM): meat + vegetables + ice cream  Snk (3 PM): chips or cakes or cookies D (5 PM): skips S (9 pm): cereal  Beverages: coffee, water (2-3 bottles), juice  Usual physical activity: weight lifting 2x/week, cardio  Estimated energy needs: 1800-2000 calories 200-225 g carbohydrates 135-150 g protein 50-56 g fat  Progress Towards Goal(s):  In progress.   Nutritional Diagnosis:  NI-5.8.5 Inadeqate fiber intake As related to balanced meals.  As evidenced by dietary recall.    Intervention:  Nutrition education and counseling. Pt was educated and counseled on benefit of having balanced meals/snacks, ways to have nutrient-dense meals/snacks, det culture messages about dieting and weight. Pt was in agreement with goals listed.  Goals: - Aim to have snacks that include carbohydrates and protein.  - Try not to skip dinner. - Meet nutritional needs with balanced meals (including protein, carbohydrates, and non-starchy vegetables) 3 times a day.  - Challenge diet culture messages.   Teaching Method Utilized:  Visual Auditory Hands on  Handouts given during visit include:  none  Barriers to learning/adherence to lifestyle change: work-life balance  Demonstrated degree of understanding via:  Teach Back  Monitoring/Evaluation:  Dietary intake, exercise, and body weight in 2 month(s).

## 2018-05-03 ENCOUNTER — Other Ambulatory Visit: Payer: Self-pay | Admitting: Rheumatology

## 2018-05-03 NOTE — Telephone Encounter (Signed)
Last Visit: 03/10/18 Next visit: 06/13/18 Labs: 03/24/18 CK 135 which is normal. Cr is higher.  Okay to refill per Dr. Estanislado Pandy

## 2018-05-29 ENCOUNTER — Other Ambulatory Visit: Payer: Self-pay | Admitting: Rheumatology

## 2018-05-30 ENCOUNTER — Other Ambulatory Visit: Payer: Self-pay | Admitting: Rheumatology

## 2018-05-30 NOTE — Telephone Encounter (Signed)
Last Visit: 03/10/2018 Next Visit: 06/13/2018  Okay to refill per Dr. Estanislado Pandy.

## 2018-06-05 ENCOUNTER — Other Ambulatory Visit: Payer: Self-pay | Admitting: Rheumatology

## 2018-06-06 NOTE — Telephone Encounter (Signed)
Last Visit: 03/10/2018 Next Visit: 06/13/2018 Labs: 03/24/2018 CK 135 which is normal. Cr is higher.   Okay to refill per Dr. Estanislado Pandy.

## 2018-06-07 NOTE — Progress Notes (Signed)
Virtual Visit via Video Note  I connected with Connie Brady on 06/13/18 at  8:15 AM EDT by a video enabled telemedicine application and verified that I am speaking with the correct person using two identifiers.   I discussed the limitations of evaluation and management by telemedicine and the availability of in person appointments. The patient expressed understanding and agreed to proceed. This service was conducted via virtual visit.  Webex utilized but visual tool did not work. The patient was located at home. I was located in my office.  Consent was obtained prior to the virtual visit and is aware of possible charges through their insurance for this visit.  The patient is an established patient.  Dr. Estanislado Pandy, MD conducted the virtual visit and [name], PA-C acted as scribe during the service.  Office staff helped with scheduling follow up visits after the service was conducted.   CC: Left knee joint pain   History of Present Illness: Patient is a 48 year old female with a past medical history of polymyositis, sjogren's, and osteoarthritis.  She is on Imuran 50 mg po daily and prednisone 7 mg po daily. She denies any polymyositis flares. She denies any muscle aches or muscle tenderness. She denies any muscle weakness.  She has no difficulty raising her arms. She continues to have left knee joint pain . She had a left knee MRI performed on 03/15/18.  She has had a visco injections in the past.  She has some difficulty getting out the car and getting up from a chair due to the left knee joint pain.   Review of Systems  Constitutional: Positive for malaise/fatigue. Negative for fever.  HENT:       +Mouth dryness   Eyes: Negative for photophobia, pain, discharge and redness.  Respiratory: Negative for cough, shortness of breath and wheezing.   Cardiovascular: Negative for chest pain and palpitations.  Gastrointestinal: Negative for blood in stool, constipation and diarrhea.   Genitourinary: Negative for dysuria.  Musculoskeletal: Positive for joint pain. Negative for back pain, myalgias and neck pain.  Skin: Negative for rash.  Neurological: Negative for dizziness and headaches.  Psychiatric/Behavioral: Negative for depression. The patient has insomnia. The patient is not nervous/anxious.      Observations/Objective: Patient reports morning stiffness for 5 minutes.   Patient denies nocturnal pain.  Difficulty dressing/grooming: Denies Difficulty climbing stairs: Reports Difficulty getting out of chair: Reports Difficulty using hands for taps, buttons, cutlery, and/or writing: Denies  Assessment and Plan: Polymyositis ([place]) - History of elevated CK, positive muscle biopsy, myositis panel negative, initial CK 522 -She has not had any signs or symptoms of a flare.  She has no muscle aches or muscle tenderness.  She has no muscle weakness.  She has no difficulty raising her arms.  She has some difficulty getting up from a seated position due to the discomfort in both knee joints.  She will continue on Imuran 50 mg 2 tablets daily and prednisone 7 mg po daily.  She was advised to notify us if she develops signs or of symptoms of a flare. Future order for CK will be placed today. She will follow up in 3 months.   High risk medication use - Imuran 100 mg daily and prednisone 7 mg daily. CBC and CMP were drawn on 03/24/18.  She is due for lab work in May and every 3 months.  Future orders will be placed today.  Sjogren's syndrome with keratoconjunctivitis sicca (HCC) - ANA 1:40  centromere, +SSA: She continues to have chronic mouth dryness. She has no eye dryness at this time. She takes Pilocarpine 5 mg TID PRN for sicca symptoms.    Primary osteoarthritis of both hips-She has some difficulty getting in and out of the car.  Primary osteoarthritis of both knees - She has been experiencing increased left knee joint pain.  She has no joint swelling.  She has some  difficulty getting up from a chair and getting in and out of the car.   She had a MRI of the left knee joint on 03/15/18 that revealed a prior meniscal tear and partial thickness cartilage loss of lateral trochlea and extensive full thickness cartilage loss of lateral femoral condyle and lateral tibial plateau. We will apply for visco gel injections for both knee joints.    Primary osteoarthritis of both feet-She has no feet pain or joint swelling.  DDD (degenerative disc disease), lumbar-She has no lower back pain at this time.  On prednisone therapy-She will continue on prednisone 7 mg po daily.  We will check CK with next lab work.  Future order will be placed today.    Follow Up Instructions: She will follow up in 3 months. We will apply for visco gel injections for both knee joints.   Future orders for CK, CBC, and CMP will be placed today.    I discussed the assessment and treatment plan with the patient. The patient was provided an opportunity to ask questions and all were answered. The patient agreed with the plan and demonstrated an understanding of the instructions.   The patient was advised to call back or seek an in-person evaluation if the symptoms worsen or if the condition fails to improve as anticipated.  I provided 25 minutes of non-face-to-face time during this encounter.  Bo Merino, MD  Scribed by- Ofilia Neas, PA-C

## 2018-06-13 ENCOUNTER — Other Ambulatory Visit: Payer: Self-pay

## 2018-06-13 ENCOUNTER — Telehealth (INDEPENDENT_AMBULATORY_CARE_PROVIDER_SITE_OTHER): Payer: BC Managed Care – PPO | Admitting: Rheumatology

## 2018-06-13 ENCOUNTER — Telehealth: Payer: Self-pay

## 2018-06-13 ENCOUNTER — Encounter: Payer: Self-pay | Admitting: Rheumatology

## 2018-06-13 DIAGNOSIS — Z79899 Other long term (current) drug therapy: Secondary | ICD-10-CM

## 2018-06-13 DIAGNOSIS — M170 Bilateral primary osteoarthritis of knee: Secondary | ICD-10-CM

## 2018-06-13 DIAGNOSIS — M19071 Primary osteoarthritis, right ankle and foot: Secondary | ICD-10-CM

## 2018-06-13 DIAGNOSIS — M3320 Polymyositis, organ involvement unspecified: Secondary | ICD-10-CM | POA: Diagnosis not present

## 2018-06-13 DIAGNOSIS — M5136 Other intervertebral disc degeneration, lumbar region: Secondary | ICD-10-CM

## 2018-06-13 DIAGNOSIS — Z87448 Personal history of other diseases of urinary system: Secondary | ICD-10-CM

## 2018-06-13 DIAGNOSIS — M79662 Pain in left lower leg: Secondary | ICD-10-CM

## 2018-06-13 DIAGNOSIS — M160 Bilateral primary osteoarthritis of hip: Secondary | ICD-10-CM

## 2018-06-13 DIAGNOSIS — M19072 Primary osteoarthritis, left ankle and foot: Secondary | ICD-10-CM

## 2018-06-13 DIAGNOSIS — Z7952 Long term (current) use of systemic steroids: Secondary | ICD-10-CM

## 2018-06-13 DIAGNOSIS — Z8639 Personal history of other endocrine, nutritional and metabolic disease: Secondary | ICD-10-CM

## 2018-06-13 DIAGNOSIS — M3501 Sicca syndrome with keratoconjunctivitis: Secondary | ICD-10-CM

## 2018-06-13 NOTE — Telephone Encounter (Signed)
Please apply for bilateral knee visco, per Dr. Deveshwar. Thanks!  

## 2018-06-14 LAB — COMPLETE METABOLIC PANEL WITH GFR
AG Ratio: 1.3 (calc) (ref 1.0–2.5)
ALT: 7 U/L (ref 6–29)
AST: 11 U/L (ref 10–35)
Albumin: 3.6 g/dL (ref 3.6–5.1)
Alkaline phosphatase (APISO): 49 U/L (ref 31–125)
BUN/Creatinine Ratio: 9 (calc) (ref 6–22)
BUN: 13 mg/dL (ref 7–25)
CO2: 28 mmol/L (ref 20–32)
Calcium: 9.1 mg/dL (ref 8.6–10.2)
Chloride: 105 mmol/L (ref 98–110)
Creat: 1.4 mg/dL — ABNORMAL HIGH (ref 0.50–1.10)
GFR, Est African American: 51 mL/min/{1.73_m2} — ABNORMAL LOW (ref >=60)
GFR, Est Non African American: 44 mL/min/{1.73_m2} — ABNORMAL LOW (ref >=60)
Globulin: 2.7 g/dL (calc) (ref 1.9–3.7)
Glucose, Bld: 95 mg/dL (ref 65–99)
Potassium: 3.8 mmol/L (ref 3.5–5.3)
Sodium: 139 mmol/L (ref 135–146)
Total Bilirubin: 0.4 mg/dL (ref 0.2–1.2)
Total Protein: 6.3 g/dL (ref 6.1–8.1)

## 2018-06-14 LAB — CBC WITH DIFFERENTIAL/PLATELET
Absolute Monocytes: 434 cells/uL (ref 200–950)
Basophils Absolute: 31 cells/uL (ref 0–200)
Basophils Relative: 0.5 %
Eosinophils Absolute: 12 cells/uL — ABNORMAL LOW (ref 15–500)
Eosinophils Relative: 0.2 %
HCT: 36.2 % (ref 35.0–45.0)
Hemoglobin: 12.2 g/dL (ref 11.7–15.5)
Lymphs Abs: 1978 cells/uL (ref 850–3900)
MCH: 29 pg (ref 27.0–33.0)
MCHC: 33.7 g/dL (ref 32.0–36.0)
MCV: 86 fL (ref 80.0–100.0)
MPV: 9.9 fL (ref 7.5–12.5)
Monocytes Relative: 7 %
Neutro Abs: 3745 cells/uL (ref 1500–7800)
Neutrophils Relative %: 60.4 %
Platelets: 250 10*3/uL (ref 140–400)
RBC: 4.21 10*6/uL (ref 3.80–5.10)
RDW: 13.1 % (ref 11.0–15.0)
Total Lymphocyte: 31.9 %
WBC: 6.2 10*3/uL (ref 3.8–10.8)

## 2018-06-14 LAB — CK: Total CK: 111 U/L (ref 29–143)

## 2018-06-14 NOTE — Progress Notes (Signed)
CK WNL.   CBC stable  Creatinine is elevated.  Please notify patient and advise her to avoid NSAIDs.  Please forward labs to PCP.  Rest of CMP WNL.

## 2018-06-15 ENCOUNTER — Telehealth (INDEPENDENT_AMBULATORY_CARE_PROVIDER_SITE_OTHER): Payer: Self-pay

## 2018-06-15 NOTE — Telephone Encounter (Signed)
Submitted VOB for Synvisc series, bilateral knee.

## 2018-06-15 NOTE — Telephone Encounter (Signed)
Noted  

## 2018-06-21 ENCOUNTER — Telehealth: Payer: Self-pay

## 2018-06-21 NOTE — Telephone Encounter (Signed)
PA required for Synvisc series, bilateral knee. Faxed completed PA form to [facility] at [phone].

## 2018-06-23 ENCOUNTER — Telehealth: Payer: Self-pay | Admitting: Physician Assistant

## 2018-06-23 ENCOUNTER — Telehealth: Payer: Self-pay

## 2018-06-23 NOTE — Telephone Encounter (Signed)
Spoke with patient who confirmed all demographics. Patient has a smart phone and will be signing up for My Chart today. I will sent virtual consent to her this afternoon. If she has not signed up, I will call her back. She will have vitals ready for visit.

## 2018-06-23 NOTE — Telephone Encounter (Signed)
Please schedule patient an appointment with Dr. Estanislado Pandy or Lovena Le for gel injection.  Thank You  Patient is approved for Synvisc series, bilateral knee. Buy & Bill Covered at 100% after Co-pay Co-pay of $80.00 or $160.00 PA required PA Approval# [id number] Valid 06/21/2018- 06/21/2019

## 2018-06-27 ENCOUNTER — Ambulatory Visit: Payer: BC Managed Care – PPO | Admitting: Registered"

## 2018-06-28 ENCOUNTER — Telehealth: Payer: BC Managed Care – PPO | Admitting: Physician Assistant

## 2018-06-28 ENCOUNTER — Other Ambulatory Visit: Payer: Self-pay

## 2018-06-28 ENCOUNTER — Ambulatory Visit: Payer: BC Managed Care – PPO | Admitting: Cardiology

## 2018-06-28 ENCOUNTER — Encounter: Payer: Self-pay | Admitting: Cardiology

## 2018-06-28 VITALS — BP 110/76 | HR 67 | Ht 68.0 in | Wt 225.2 lb

## 2018-06-28 DIAGNOSIS — M3320 Polymyositis, organ involvement unspecified: Secondary | ICD-10-CM

## 2018-06-28 DIAGNOSIS — Z01812 Encounter for preprocedural laboratory examination: Secondary | ICD-10-CM

## 2018-06-28 DIAGNOSIS — R0789 Other chest pain: Secondary | ICD-10-CM

## 2018-06-28 DIAGNOSIS — R079 Chest pain, unspecified: Secondary | ICD-10-CM

## 2018-06-28 MED ORDER — METOPROLOL TARTRATE 100 MG PO TABS: 100.0000 mg | ORAL_TABLET | ORAL | 0 refills | Status: DC

## 2018-06-28 NOTE — Progress Notes (Signed)
Cardiology Office Note:    Date:  06/28/2018   ID:  HARJOT ZAVADIL, DOB 09/04/1970, MRN [id number]  PCP:  Kelton Pillar, MD  Cardiologist:  Candee Furbish, MD  Electrophysiologist:  None   Referring MD: Kelton Pillar, MD   CC: Chest pain  History of Present Illness:    Connie Brady is a 48 y.o. female with polymyositis, Sjogren's, hips and knees OA on prednisone 7mg  PO QD, obesity here for the evaluation of chest pain at the request of Dr. Kelton Pillar. In review of her last note from Dr. Laurann Montana, she had been having CP off and on over several months with no particular pattern. Went to ER previously and Troponin and ECG was normal. At that time 10/2017, was feeling right sided CP with chills, no fever, but did had associated RUQ pain. Barium swallow was normal.  LFT's normal. Sees GI. Sore to the touch on chest wall. Does not seem to be exertional. Has GERD, acid in throat, cough, Nexium. On no cardiac meds.   Still having pain. Usually middle or right. No causes. When go to bed heart beats fast.   Non smoker, no ETOH. Works in [employer]. No early CAD, no DM. No HL.   Creat 1.2-1.4 CK 111 Hg 12.2 Sed 25   Past Medical History:  Diagnosis Date  . Anemia   . Polymyositis ([place])   . Sjoegren syndrome     Past Surgical History:  Procedure Laterality Date  . BUNIONECTOMY Bilateral   . KNEE ARTHROPLASTY Left 2018   meniscal tear  . KNEE ARTHROSCOPY WITH LATERAL MENISECTOMY Left 08/24/2017   Procedure: LEFT KNEE ARTHROSCOPY WITH LATERAL MENISECTOMY, CHONDROPLASTY, ARTHROSCOPIC ASSISTED INTERNAL FIXATION LATERAL TIBIAL PLATEAU;  Surgeon: Sydnee Cabal, MD;  Location: [facility];  Service: Orthopedics;  Laterality: Left;  . KNEE SURGERY     right  . MUSCLE BIOPSY     r/thigh  . PLANTAR FASCIA SURGERY Right   . TOTAL SHOULDER ARTHROPLASTY Left 01/12/2017    Current Medications: Current Meds  Medication Sig  . azaTHIOprine (IMURAN) 50 MG  tablet TAKE 1 TABLET BY MOUTH TWICE DAILY  . Cholecalciferol (VITAMIN D PO) Take by mouth daily.  . diclofenac sodium (VOLTAREN) 1 % GEL diclofenac 1 % topical gel  . DULoxetine (CYMBALTA) 60 MG capsule duloxetine 60 mg capsule,delayed release  TK ONE C PO QAM  . esomeprazole (NEXIUM) 20 MG capsule Take 20 mg by mouth as needed.   . gabapentin (NEURONTIN) 300 MG capsule TAKE 1 CAPSULE(300 MG) BY MOUTH TWICE DAILY  . lidocaine (XYLOCAINE) 5 % ointment Apply to affected area daily as needed  . LOPREEZA 1-0.5 MG tablet daily.  . pilocarpine (SALAGEN) 5 MG tablet TAKE 1 TABLET(5 MG) BY MOUTH THREE TIMES DAILY AS NEEDED  . predniSONE (DELTASONE) 1 MG tablet TAKE 2 TABLETS(2 MG) DAILY WITH 5 MG TABLET TO EQUAL 7 MG  . predniSONE (DELTASONE) 5 MG tablet TAKE 1 TABLET BY MOUTH DAILY WITH BREAKFAST  . traZODone (DESYREL) 100 MG tablet Take 200 mg by mouth at bedtime.      Allergies:   Lyrica [pregabalin] and Percocet [oxycodone-acetaminophen]   Social History   Socioeconomic History  . Marital status: Legally Separated    Spouse name: Not on file  . Number of children: Not on file  . Years of education: Not on file  . Highest education level: Not on file  Occupational History  . Not on file  Social Needs  .  Financial resource strain: Not on file  . Food insecurity:    Worry: Not on file    Inability: Not on file  . Transportation needs:    Medical: Not on file    Non-medical: Not on file  Tobacco Use  . Smoking status: Never Smoker  . Smokeless tobacco: Never Used  Substance and Sexual Activity  . Alcohol use: No  . Drug use: No  . Sexual activity: Not on file  Lifestyle  . Physical activity:    Days per week: Not on file    Minutes per session: Not on file  . Stress: Not on file  Relationships  . Social connections:    Talks on phone: Not on file    Gets together: Not on file    Attends religious service: Not on file    Active member of club or organization: Not on file     Attends meetings of clubs or organizations: Not on file    Relationship status: Not on file  Other Topics Concern  . Not on file  Social History Narrative  . Not on file     Family History: The patient's family history includes Diabetes in her brother and brother; Hypertension in her brother, brother, sister, sister, and sister.  ROS:   Please see the history of present illness.    No fevers chills nausea vomiting syncope bleeding all other systems reviewed and are negative.  EKGs/Labs/Other Studies Reviewed:    The following studies were reviewed today:  LE Doppler 03/10/18: No DVT  ECHO 05/09/13:  - Left ventricle: The cavity size was normal. Wall thickness was normal. Systolic function was normal. The estimated ejection fraction was in the range of 55% to 60%. Wall motion was normal; there were no regional wall motion abnormalities. Left ventricular diastolic function parameters were normal. - Pericardium, extracardiac: A trivial pericardial effusion was identified.  EKG:  EKG is  ordered today.  The ekg ordered today demonstrates sinus rhythm 67 nonspecific ST flattening no significant change from prior. 10/25/17 - NSR 78 with no other changes  Recent Labs: 06/13/2018: ALT 7; BUN 13; Creat 1.40; Hemoglobin 12.2; Platelets 250; Potassium 3.8; Sodium 139  Recent Lipid Panel No results found for: CHOL, TRIG, HDL, CHOLHDL, VLDL, LDLCALC, LDLDIRECT  Physical Exam:    VS:  BP 110/76   Pulse 67   Ht 5\' 8"  (1.727 m)   Wt 225 lb 3.2 oz (102.2 kg)   BMI 34.24 kg/m     Wt Readings from Last 3 Encounters:  06/28/18 225 lb 3.2 oz (102.2 kg)  03/10/18 222 lb 6.4 oz (100.9 kg)  10/25/17 220 lb (99.8 kg)     GEN:  Well nourished, well developed in no acute distress HEENT: Normal NECK: No JVD; No carotid bruits LYMPHATICS: No lymphadenopathy CARDIAC: RRR, no murmurs, rubs, gallops, mild chest wall tenderness right of sternum RESPIRATORY:  Clear to auscultation  without rales, wheezing or rhonchi  ABDOMEN: Soft, non-tender, non-distended MUSCULOSKELETAL:  No edema; No deformity  SKIN: Warm and dry NEUROLOGIC:  Alert and oriented x 3 PSYCHIATRIC:  Normal affect   ASSESSMENT:    1. Chest pain of uncertain etiology   2. Pre-procedure lab exam   3. Polymyositis ([place])    PLAN:    In order of problems listed above:  Chest pain -Atypical, could be musculoskeletal, some chest wall tenderness right of sternum.  Nonetheless, given her history of autoimmune disease, polymyositis, I think would be a good idea to  evaluate her with a coronary CT scan with possible FFR analysis.  Hopefully if negative, we can continue with good primary prevention strategies.  If there is coronary calcium present, would strongly consider statin medication to help minimize risk.  Could also consider low-dose aspirin in the situation as well.  We will follow-up with results of study. -In addition, sometimes long-term prednisone use can cause irritation especially of the esophagus and stomach which can mimic heart pain.  She is taking Nexium.  She has been seen by GI.   Polymyositis - Prednisone use daily.  Rheumatology.  Obesity - Continue to encourage weight loss.  BMI 34.  Knee and hip OA - Continue to encourage movement, exercise.  Prior lower extremity Doppler with no DVT.  Medication Adjustments/Labs and Tests Ordered: Current medicines are reviewed at length with the patient today.  Concerns regarding medicines are outlined above.  Orders Placed This Encounter  Procedures  . CT CORONARY MORPH W/CTA COR W/SCORE W/CA W/CM &/OR WO/CM  . CT CORONARY FRACTIONAL FLOW RESERVE DATA PREP  . CT CORONARY FRACTIONAL FLOW RESERVE FLUID ANALYSIS  . Basic metabolic panel  . EKG 12-Lead   Meds ordered this encounter  Medications  . metoprolol tartrate (LOPRESSOR) 100 MG tablet    Sig: Take 1 tablet (100 mg total) by mouth as directed. Take I tablet 2 hours before your CT  scan    Dispense:  1 tablet    Refill:  0    Patient Instructions  Medication Instructions:  The current medical regimen is effective;  continue present plan and medications.  If you need a refill on your cardiac medications before your next appointment, please call your pharmacy.   Lab work: Please have blood work (BMP) prior to your CT scan.  This will be scheduled when your CT scan is scheduled.  If you have labs (blood work) drawn today and your tests are completely normal, you will receive your results only by: Marland Kitchen MyChart Message (if you have MyChart) OR . A paper copy in the mail If you have any lab test that is abnormal or we need to change your treatment, we will call you to review the results.  Testing/Procedures: Your physician has requested that you have cardiac CT. Cardiac computed tomography (CT) is a painless test that uses an x-ray machine to take clear, detailed pictures of your heart. For further information please visit [facility]ugeFiesta[facility][email or web address]. Please follow instruction sheet as given.  Follow-Up: Follow up as needed after the above testing.  Thank you for choosing [facility]!!     Please arrive at the [facility] main entrance of [facility] at TBA  AM (30-45 minutes prior to test start time)  [facility] [place], [place] [address] [phone]  Proceed to the [facility] Radiology Department (First Floor).  Please follow these instructions carefully (unless otherwise directed):  Hold all erectile dysfunction medications at least 48 hours prior to test.  On the Night Before the Test: . Be sure to Drink plenty of water. . Do not consume any caffeinated/decaffeinated beverages or chocolate 12 hours prior to your test. . Do not take any antihistamines 12 hours prior to your test. . If you take Metformin do not take 24 hours prior to test.  On the Day of the Test: . Drink plenty of water. Do not drink any water  within one hour of the test. . Do not eat any food 4 hours prior to the  test. . You may take your regular medications prior to the test.  . Take metoprolol (Lopressor) two hours prior to test. . HOLD Furosemide/Hydrochlorothiazide morning of the test.  After the Test: . Drink plenty of water. . After receiving IV contrast, you may experience a mild flushed feeling. This is normal. . On occasion, you may experience a mild rash up to 24 hours after the test. This is not dangerous. If this occurs, you can take Benadryl 25 mg and increase your fluid intake. . If you experience trouble breathing, this can be serious. If it is severe call 911 IMMEDIATELY. If it is mild, please call our office. . If you take any of these medications: Glipizide/Metformin, Avandament, Glucavance, please do not take 48 hours after completing test.     Signed, Candee Furbish, MD  06/28/2018 9:14 AM    [place]

## 2018-06-28 NOTE — Patient Instructions (Addendum)
Medication Instructions:  The current medical regimen is effective;  continue present plan and medications.  If you need a refill on your cardiac medications before your next appointment, please call your pharmacy.   Lab work: Please have blood work (BMP) prior to your CT scan.  This will be scheduled when your CT scan is scheduled.  If you have labs (blood work) drawn today and your tests are completely normal, you will receive your results only by: Marland Kitchen MyChart Message (if you have MyChart) OR . A paper copy in the mail If you have any lab test that is abnormal or we need to change your treatment, we will call you to review the results.  Testing/Procedures: Your physician has requested that you have cardiac CT. Cardiac computed tomography (CT) is a painless test that uses an x-ray machine to take clear, detailed pictures of your heart. For further information please visit [facility]ugeFiesta[facility][email or web address]. Please follow instruction sheet as given.  Follow-Up: Follow up as needed after the above testing.  Thank you for choosing [facility]!!     Please arrive at the [facility] main entrance of [facility] at TBA  AM (30-45 minutes prior to test start time)  [facility] [place], [place] [address] [phone]  Proceed to the [facility] Radiology Department (First Floor).  Please follow these instructions carefully (unless otherwise directed):  Hold all erectile dysfunction medications at least 48 hours prior to test.  On the Night Before the Test: . Be sure to Drink plenty of water. . Do not consume any caffeinated/decaffeinated beverages or chocolate 12 hours prior to your test. . Do not take any antihistamines 12 hours prior to your test. . If you take Metformin do not take 24 hours prior to test.  On the Day of the Test: . Drink plenty of water. Do not drink any water within one hour of the test. . Do not eat any food 4 hours prior to the  test. . You may take your regular medications prior to the test.  . Take metoprolol (Lopressor) two hours prior to test. . HOLD Furosemide/Hydrochlorothiazide morning of the test.  After the Test: . Drink plenty of water. . After receiving IV contrast, you may experience a mild flushed feeling. This is normal. . On occasion, you may experience a mild rash up to 24 hours after the test. This is not dangerous. If this occurs, you can take Benadryl 25 mg and increase your fluid intake. . If you experience trouble breathing, this can be serious. If it is severe call 911 IMMEDIATELY. If it is mild, please call our office. . If you take any of these medications: Glipizide/Metformin, Avandament, Glucavance, please do not take 48 hours after completing test.

## 2018-06-29 ENCOUNTER — Telehealth: Payer: BC Managed Care – PPO | Admitting: Cardiovascular Disease

## 2018-07-01 NOTE — Telephone Encounter (Signed)
LMOM for patient to call and schedule Synvisc injections for bilateral knees.  Co-pay of $80 or $160

## 2018-07-03 ENCOUNTER — Other Ambulatory Visit: Payer: Self-pay | Admitting: Rheumatology

## 2018-07-04 NOTE — Telephone Encounter (Signed)
Last Visit: 03/10/2018 Next Visit: 09/06/18  Okay to refill per Dr. Estanislado Pandy

## 2018-07-15 ENCOUNTER — Ambulatory Visit: Payer: BC Managed Care – PPO | Admitting: Physician Assistant

## 2018-07-22 ENCOUNTER — Ambulatory Visit (INDEPENDENT_AMBULATORY_CARE_PROVIDER_SITE_OTHER): Payer: BC Managed Care – PPO | Admitting: Physician Assistant

## 2018-07-22 ENCOUNTER — Other Ambulatory Visit: Payer: Self-pay

## 2018-07-22 DIAGNOSIS — M170 Bilateral primary osteoarthritis of knee: Secondary | ICD-10-CM | POA: Diagnosis not present

## 2018-07-22 MED ORDER — HYLAN G-F 20 16 MG/2ML IX SOSY
16.0000 mg | PREFILLED_SYRINGE | INTRA_ARTICULAR | Status: AC | PRN
  Administered 2018-07-22: 16 mg via INTRA_ARTICULAR

## 2018-07-22 MED ORDER — LIDOCAINE HCL 1 % IJ SOLN
1.5000 mL | INTRAMUSCULAR | Status: AC | PRN
  Administered 2018-07-22: 1.5 mL

## 2018-07-22 NOTE — Progress Notes (Signed)
   Procedure Note  Patient: Connie Brady             Date of Birth: Apr 16, 1970           MRN: [id number]             Visit Date: 07/22/2018  Procedures: Visit Diagnoses: Primary osteoarthritis of both knees - Plan: Large Joint Inj: bilateral knee Synvisc #1 bilateral knee joint injections B/B Large Joint Inj: bilateral knee on 07/22/2018 8:12 AM Indications: pain Details: 25 G 1.5 in needle, medial approach  Arthrogram: No  Medications (Right): 16 mg Hylan 16 MG/2ML; 1.5 mL lidocaine 1 % Aspirate (Right): 0 mL Medications (Left): 16 mg Hylan 16 MG/2ML; 1.5 mL lidocaine 1 % Aspirate (Left): 0 mL Outcome: tolerated well, no immediate complications Procedure, treatment alternatives, risks and benefits explained, specific risks discussed. Consent was given by the patient. Immediately prior to procedure a time out was called to verify the correct patient, procedure, equipment, support staff and site/side marked as required. Patient was prepped and draped in the usual sterile fashion.    Patient tolerated the procedure well.  [name], PA-C

## 2018-08-01 ENCOUNTER — Ambulatory Visit (INDEPENDENT_AMBULATORY_CARE_PROVIDER_SITE_OTHER): Payer: BC Managed Care – PPO | Admitting: Physician Assistant

## 2018-08-01 ENCOUNTER — Other Ambulatory Visit: Payer: Self-pay

## 2018-08-01 DIAGNOSIS — M170 Bilateral primary osteoarthritis of knee: Secondary | ICD-10-CM

## 2018-08-01 MED ORDER — HYLAN G-F 20 16 MG/2ML IX SOSY
16.0000 mg | PREFILLED_SYRINGE | INTRA_ARTICULAR | Status: AC | PRN
  Administered 2018-08-01: 16 mg via INTRA_ARTICULAR

## 2018-08-01 MED ORDER — LIDOCAINE HCL 1 % IJ SOLN
1.5000 mL | INTRAMUSCULAR | Status: AC | PRN
  Administered 2018-08-01: 1.5 mL

## 2018-08-01 NOTE — Progress Notes (Signed)
   Procedure Note  Patient: CHERYL CHAY             Date of Birth: September 01, 1970           MRN: [id number]             Visit Date: 08/01/2018  Procedures: Visit Diagnoses: Primary osteoarthritis of both knees - Plan: Large Joint Inj: bilateral knee Synvisc #2 bilateral knee joint injections  Large Joint Inj: bilateral knee on 08/01/2018 8:37 AM Indications: pain Details: 25 G 1.5 in needle, medial approach  Arthrogram: No  Medications (Right): 1.5 mL lidocaine 1 %; 16 mg Hylan 16 MG/2ML Aspirate (Right): 0 mL Medications (Left): 1.5 mL lidocaine 1 %; 16 mg Hylan 16 MG/2ML Aspirate (Left): 0 mL Outcome: tolerated well, no immediate complications Procedure, treatment alternatives, risks and benefits explained, specific risks discussed. Consent was given by the patient. Immediately prior to procedure a time out was called to verify the correct patient, procedure, equipment, support staff and site/side marked as required. Patient was prepped and draped in the usual sterile fashion.     Patient tolerated the procedure well.  [name], PA-C

## 2018-08-08 ENCOUNTER — Other Ambulatory Visit: Payer: Self-pay

## 2018-08-08 ENCOUNTER — Ambulatory Visit (INDEPENDENT_AMBULATORY_CARE_PROVIDER_SITE_OTHER): Payer: BC Managed Care – PPO | Admitting: Physician Assistant

## 2018-08-08 DIAGNOSIS — M170 Bilateral primary osteoarthritis of knee: Secondary | ICD-10-CM | POA: Diagnosis not present

## 2018-08-08 MED ORDER — LIDOCAINE HCL 1 % IJ SOLN
1.5000 mL | INTRAMUSCULAR | Status: AC | PRN
  Administered 2018-08-08: 1.5 mL

## 2018-08-08 MED ORDER — HYLAN G-F 20 16 MG/2ML IX SOSY
16.0000 mg | PREFILLED_SYRINGE | INTRA_ARTICULAR | Status: AC | PRN
  Administered 2018-08-08: 16 mg via INTRA_ARTICULAR

## 2018-08-08 NOTE — Progress Notes (Signed)
   Procedure Note  Patient: CHRYSTINE FROGGE             Date of Birth: Apr 30, 1970           MRN: [id number]             Visit Date: 08/08/2018  Procedures: Visit Diagnoses: Primary osteoarthritis of both knees  Synvisc #3 bilateral knees B/B Large Joint Inj: bilateral knee on 08/08/2018 8:05 AM Indications: pain Details: 25 G 1.5 in needle, medial approach  Arthrogram: No  Medications (Right): 16 mg Hylan 16 MG/2ML; 1.5 mL lidocaine 1 % Aspirate (Right): 0 mL Medications (Left): 16 mg Hylan 16 MG/2ML; 1.5 mL lidocaine 1 % Aspirate (Left): 0 mL Outcome: tolerated well, no immediate complications Procedure, treatment alternatives, risks and benefits explained, specific risks discussed. Consent was given by the patient. Immediately prior to procedure a time out was called to verify the correct patient, procedure, equipment, support staff and site/side marked as required. Patient was prepped and draped in the usual sterile fashion.    Patient tolerated the procedure well.  [name], PA-C

## 2018-08-22 ENCOUNTER — Other Ambulatory Visit: Payer: BC Managed Care – PPO | Admitting: *Deleted

## 2018-08-22 ENCOUNTER — Other Ambulatory Visit: Payer: Self-pay

## 2018-08-22 DIAGNOSIS — Z01812 Encounter for preprocedural laboratory examination: Secondary | ICD-10-CM

## 2018-08-22 DIAGNOSIS — R079 Chest pain, unspecified: Secondary | ICD-10-CM

## 2018-08-22 LAB — BASIC METABOLIC PANEL
BUN/Creatinine Ratio: 9 (ref 9–23)
BUN: 12 mg/dL (ref 6–24)
CO2: 22 mmol/L (ref 20–29)
Calcium: 8.9 mg/dL (ref 8.7–10.2)
Chloride: 104 mmol/L (ref 96–106)
Creatinine, Ser: 1.29 mg/dL — ABNORMAL HIGH (ref 0.57–1.00)
GFR calc Af Amer: 56 mL/min/{1.73_m2} — ABNORMAL LOW (ref >59)
GFR calc non Af Amer: 49 mL/min/{1.73_m2} — ABNORMAL LOW (ref >59)
Glucose: 94 mg/dL (ref 65–99)
Potassium: 4 mmol/L (ref 3.5–5.2)
Sodium: 138 mmol/L (ref 134–144)

## 2018-08-24 ENCOUNTER — Encounter: Payer: BC Managed Care – PPO | Admitting: *Deleted

## 2018-08-24 ENCOUNTER — Ambulatory Visit (HOSPITAL_COMMUNITY)
Admission: RE | Admit: 2018-08-24 | Discharge: 2018-08-24 | Disposition: A | Discharge status: Home / Self Care | Payer: BC Managed Care – PPO | Source: Ambulatory Visit | Attending: Cardiology | Admitting: Cardiology

## 2018-08-24 ENCOUNTER — Other Ambulatory Visit: Payer: Self-pay

## 2018-08-24 ENCOUNTER — Ambulatory Visit (HOSPITAL_COMMUNITY): Admission: RE | Admit: 2018-08-24 | Payer: BC Managed Care – PPO | Source: Ambulatory Visit

## 2018-08-24 DIAGNOSIS — R0789 Other chest pain: Secondary | ICD-10-CM | POA: Diagnosis present

## 2018-08-24 DIAGNOSIS — Z01812 Encounter for preprocedural laboratory examination: Secondary | ICD-10-CM | POA: Diagnosis present

## 2018-08-24 DIAGNOSIS — R079 Chest pain, unspecified: Secondary | ICD-10-CM

## 2018-08-24 DIAGNOSIS — Z006 Encounter for examination for normal comparison and control in clinical research program: Secondary | ICD-10-CM

## 2018-08-24 MED ORDER — NITROGLYCERIN 0.4 MG SL SUBL
0.8000 mg | SUBLINGUAL_TABLET | Freq: Once | SUBLINGUAL | Status: DC
  Filled 2018-08-24: qty 25

## 2018-08-24 MED ORDER — NITROGLYCERIN 0.4 MG SL SUBL
SUBLINGUAL_TABLET | SUBLINGUAL | Status: AC
  Filled 2018-08-24: qty 2

## 2018-08-24 MED ORDER — IOHEXOL 350 MG/ML SOLN
100.0000 mL | Freq: Once | INTRAVENOUS | Status: AC | PRN
  Administered 2018-08-24: 100 mL via INTRAVENOUS

## 2018-08-24 NOTE — Progress Notes (Signed)
Office Visit Note  Patient: Connie Brady             Date of Birth: [birth year]           MRN: [id number]             PCP: Kelton Pillar, MD Referring: Kelton Pillar, MD Visit Date: 09/06/2018 Occupation: @GUAROCC @  Subjective:  Left knee joint buckling     History of Present Illness: Connie Brady is a 48 y.o. female with history of polymyositis, sjogren's, and osteoarthritis. She is on Imuran 50 mg 1 tablet BID and prednisone 7 mg po daily.   She denies any signs or symptoms of polymyositis flare.  She has no increased muscle weakness or muscle tenderness.  She has some difficulty getting up from a seated position but no difficulty raising arms above her head.  She had bilateral Visco injections in June 2020.  She noticed improvement in both knee joints. She has occasional left knee joint buckling and discomfort.  She states the left knee joint stays swollen.  She performs knee exercises at home.  She states after the visco injections she has had less difficulty bending her knee joints.  She has been experiencing worsening sicca symptoms.  She has been taking pilocarpine 5 mg twice daily as needed with plans on increasing to 3 times daily.     Activities of Daily Living:  Patient reports morning stiffness for several hours.   Patient Reports nocturnal pain.  Difficulty dressing/grooming: Denies Difficulty climbing stairs: Reports Difficulty getting out of chair: Reports Difficulty using hands for taps, buttons, cutlery, and/or writing: Denies  Review of Systems  Constitutional: Negative for fatigue.  HENT: Positive for mouth dryness. Negative for mouth sores and nose dryness.   Eyes: Negative for pain, itching, visual disturbance and dryness.  Respiratory: Negative for cough, hemoptysis, shortness of breath, wheezing and difficulty breathing.   Cardiovascular: Negative for chest pain, palpitations, hypertension and swelling in legs/feet.  Gastrointestinal: Negative  for abdominal pain, blood in stool, constipation and diarrhea.  Endocrine: Negative for increased urination.  Genitourinary: Negative for painful urination and pelvic pain.  Musculoskeletal: Positive for arthralgias, joint pain, joint swelling and morning stiffness. Negative for myalgias, muscle weakness, muscle tenderness and myalgias.  Skin: Positive for rash. Negative for color change, pallor, hair loss, nodules/bumps, skin tightness, ulcers and sensitivity to sunlight.  Allergic/Immunologic: Negative for susceptible to infections.  Neurological: Negative for dizziness, light-headedness, numbness, headaches and memory loss.  Hematological: Negative for bruising/bleeding tendency and swollen glands.  Psychiatric/Behavioral: Negative for depressed mood, confusion and sleep disturbance. The patient is not nervous/anxious.     PMFS History:  Patient Active Problem List   Diagnosis Date Noted   Left lateral knee pain 08/24/2017   S/P arthroscopic surgery of left knee 08/24/2017   Abnormal cervical Papanicolaou smear 08/31/2016   Obesity 08/31/2016   Premenstrual symptom 08/31/2016   Vitamin D deficiency 08/25/2016   History of chronic kidney disease 05/07/2016   Chronic kidney disease (CKD), stage III (moderate) (HCC) 03/30/2016   High risk medication use 01/10/2016   Primary osteoarthritis of both hips 01/10/2016   Primary osteoarthritis of both knees 01/10/2016   Primary osteoarthritis of both feet 01/10/2016   Bilateral plantar fasciitis 01/10/2016   Chest pain 01/26/2013   Polymyositis ([place]) 01/26/2013   Sjoegren syndrome     Past Medical History:  Diagnosis Date   Anemia    Polymyositis (HCC)    Sjoegren syndrome  Family History  Problem Relation Age of Onset   Hypertension Sister    Hypertension Brother    Diabetes Brother    Hypertension Sister    Hypertension Sister    Hypertension Brother    Diabetes Brother    Past Surgical History:    Procedure Laterality Date   BUNIONECTOMY Bilateral    KNEE ARTHROPLASTY Left 2018   meniscal tear   KNEE ARTHROSCOPY WITH LATERAL MENISECTOMY Left 08/24/2017   Procedure: LEFT KNEE ARTHROSCOPY WITH LATERAL MENISECTOMY, CHONDROPLASTY, ARTHROSCOPIC ASSISTED INTERNAL FIXATION LATERAL TIBIAL PLATEAU;  Surgeon: Sydnee Cabal, MD;  Location: [facility];  Service: Orthopedics;  Laterality: Left;   KNEE SURGERY     right   MUSCLE BIOPSY     r/thigh   PLANTAR FASCIA SURGERY Right    TOTAL SHOULDER ARTHROPLASTY Left 01/12/2017   Social History   Social History Narrative   Not on file    There is no immunization history on file for this patient.   Objective: Vital Signs: BP 107/70 (BP Location: Left Arm, Patient Position: Sitting, Cuff Size: Normal)    Pulse 92    Resp 14    Ht 5\' 8"  (1.727 m)    Wt 228 lb 9.6 oz (103.7 kg)    BMI 34.76 kg/m    Physical Exam Vitals signs and nursing note reviewed.  Constitutional:      Appearance: She is well-developed.  HENT:     Head: Normocephalic and atraumatic.  Eyes:     Conjunctiva/sclera: Conjunctivae normal.  Neck:     Musculoskeletal: Normal range of motion.  Cardiovascular:     Rate and Rhythm: Normal rate and regular rhythm.     Heart sounds: Normal heart sounds.  Pulmonary:     Effort: Pulmonary effort is normal.     Breath sounds: Normal breath sounds.  Abdominal:     General: Bowel sounds are normal.     Palpations: Abdomen is soft.  Lymphadenopathy:     Cervical: No cervical adenopathy.  Skin:    General: Skin is warm and dry.     Capillary Refill: Capillary refill takes less than 2 seconds.  Neurological:     Mental Status: She is alert and oriented to person, place, and time.  Psychiatric:        Behavior: Behavior normal.      Musculoskeletal Exam: C-spine, thoracic spine, lumbar spine good range of motion.  Some midline spinal tenderness in the lumbar region.  No SI joint tenderness.   Shoulders, elbows, wrist joints, MCPs and PIPs and DIPs good range of motion no synovitis.  She has complete fist formation bilaterally.  Hip joints have slightly limited range of motion.  Knee joints have good range of motion.  Mild warmth of the left knee on exam.  Ankle joints, MTPs, PIPs and DIPs good range of motion no synovitis.  No tenderness or swelling of ankle joints noted.  Full strength of upper and lower extremities noted.  CDAI Exam: CDAI Score: -- Patient Global: --; Provider Global: -- Swollen: --; Tender: -- Joint Exam   No joint exam has been documented for this visit   There is currently no information documented on the homunculus. Go to the Rheumatology activity and complete the homunculus joint exam.  Investigation: No additional findings.  Imaging: Ct Coronary Morph W/cta Cor W/score W/ca W/cm &/or Wo/cm  Addendum Date: 08/25/2018   ADDENDUM REPORT: 08/25/2018 18:51 EXAM: OVER-READ INTERPRETATION CT CHEST The following report is an over-read  performed by radiologist Dr. Evangeline Dakin of [facility], [place] on 08/25/2018. This over-read does not include interpretation of cardiac or coronary anatomy or pathology. The coronary CTA interpretation by the cardiologist is attached. COMPARISON:  None. FINDINGS: Vascular: No evidence of atherosclerosis involving the visualized thoracic or upper abdominal aorta. Central pulmonary arteries patent. Mediastinum: Very small hiatal hernia. Visualized esophagus normal in appearance. No pathologic lymphadenopathy within the visualized mediastinum or either hilum. Lungs: Visualized lung parenchyma clear. Central bronchi patent without significant bronchial wall thickening. No pleural effusions. Upper abdomen: Visualized upper abdomen unremarkable for the early arterial phase of enhancement. Musculoskeletal: Mild lower thoracic spondylosis. IMPRESSION: Very small hiatal hernia. No significant extracardiac findings otherwise. Electronically  Signed   By: Evangeline Dakin M.D.   On: 08/25/2018 18:51   Result Date: 08/25/2018 CLINICAL DATA:  Indications: Chest pain of uncertain etiology 48 yo female with atypical chest pain, history of polymyositis EXAM: Cardiac/Coronary  CT TECHNIQUE: The patient was scanned on a [employer]. FINDINGS: A 120 kV prospective scan was triggered in the descending thoracic aorta at 111 HU's. Axial non-contrast 3 mm slices were carried out through the heart. The data set was analyzed on a dedicated work station and scored using the [place]. Gantry rotation speed was 250 msecs and collimation was .6 mm. No beta blockade and 0.8 mg of sl NTG was given. The 3D data set was reconstructed in 5% intervals of the 67-82 % of the R-R cycle. Diastolic phases were analyzed on a dedicated work station using MPR, MIP and VRT modes. The patient received 80 cc of contrast. Aorta:  Normal size.  No calcifications.  No dissection. Aortic Valve:  Trileaflet.  No calcifications. Coronary Arteries:  Normal coronary origin.  Right dominance. RCA is a large dominant artery that gives rise to PDA and PLVB. There are minimal luminal irregularities. Left main is a large artery that gives rise to LAD and LCX arteries. LAD is a large vessel with mild non-obstructive plaque at the D1 bifucation (CADRADS 1). LCX is a non-dominant artery that gives rise to one large OM1 branch. There is mild, nonobstructive disease. Other findings: Normal variant pulmonary vein drainage with a single conjoined left pulmonary vein. Normal let atrial appendage without a thrombus. Normal size of the pulmonary artery. IMPRESSION: 1. Coronary calcium score of 0. This was 0 percentile for age and sex matched control. 2. Mild non-calcified plaque of the LAD at the D1 bifurcation. Otherwise, minimal non-obstructive CAD. 3.  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 Lyman Bishop Electronically Signed: By: Pixie Casino M.D. On: 08/25/2018 16:43     Recent Labs: Lab Results  Component Value Date   WBC 6.2 06/13/2018   HGB 12.2 06/13/2018   PLT 250 06/13/2018   NA 138 08/22/2018   K 4.0 08/22/2018   CL 104 08/22/2018   CO2 22 08/22/2018   GLUCOSE 94 08/22/2018   BUN 12 08/22/2018   CREATININE 1.29 (H) 08/22/2018   BILITOT 0.4 06/13/2018   ALKPHOS 65 10/25/2017   AST 11 06/13/2018   ALT 7 06/13/2018   PROT 6.3 06/13/2018   ALBUMIN 3.4 (L) 10/25/2017   CALCIUM 8.9 08/22/2018   GFRAA 56 (L) 08/22/2018    Speciality Comments: No specialty comments available.  Procedures:  No procedures performed Allergies: Lyrica [pregabalin] and Percocet [oxycodone-acetaminophen]   Assessment / Plan:     Visit Diagnoses: Polymyositis ([place]) - History of elevated CK, positive muscle biopsy, myositis panel negative, initial CK 522:  She has not had any signs or symptoms of a polymyositis flare recently.  She has no increased muscle weakness, muscle tenderness, or muscle aches.  She has some difficulty getting up from a seated position but has full strength of upper and lower extremities on exam.  She has no difficulty raising her arms above her head.  We discussed the importance of regular exercise.  She plans on getting a stationary bike.  She is clinically doing well on Imuran 50 mg 1 tablet twice daily and prednisone 7 mg by mouth daily.  She does not need any refills at this time.  She is advised to notify us if develops increased muscle weakness or muscle tenderness.  She will follow-up in the office in 5 months.- Plan: CK  High risk medication use -Imuran 50 mg 1 tablet twice daily prednisone 7 mg daily.  Most recent CBC/CMP within normal limits except for elevated creatinine and decreased GFR on 06/13/2018.  Repeat BMP on 08/22/2018 showed elevated creatinine but trending down.  Due for CBC today and will monitor CBC/CMP every 3 months.  Standing orders placed. - Plan: CBC with Differential/Platelet  Sjogren's syndrome with  keratoconjunctivitis sicca (HCC) - ANA 1:40 centromere, +SSA: She has chronic sicca symptoms.  She has been taking pilocarpine 5 mg twice daily as needed for symptomatic relief.  She is been noticing increased mouth dryness recently.  She will increase to pilocarpine 5 mg 3 times daily.  We also discussed over-the-counter products such as Biotene products and Xylimelts.   Primary osteoarthritis of both knees -SHe had Visco gel injections in bilateral knee joints in June 2020.  She noticed improvement in her discomfort in both knee joints.  She continues to experience buckling in the left knee at times.  She had arthroscopic surgery bilaterally.  She states the most recent surgery on the left knee was not effective and she continues to have radiating pain down the lateral aspect of her left lower extremity.  She noticed improvement in flexion of both knees after having the Visco gel injections.  We discussed the importance of joint protection and muscle strengthening.  She was encouraged to get a stationary bike and continue to exercise on a regular basis.  Importance of weight loss was also discussed.  She was also given a list of natural anti-inflammatories that she can take.  Primary osteoarthritis of both hips -She has slightly limited range of motion with no discomfort at this time.  She is some difficulty getting up from a chair at times.  Primary osteoarthritis of both feet -She has been experiencing increased discomfort in bilateral feet.  She has no tenderness or synovitis on exam today.  We discussed the importance of wearing proper fitting shoes.  She plans on getting orthotics made for her shoes.  DDD (degenerative disc disease), lumbar - She has chronic lower back pain.  She experiences discomfort if she walks for a prolonged distance.  She plans on getting a stationary bike.   On prednisone therapy - She is taking prednisone 7 mg po daily.   History of vitamin D deficiency - She is taking a  vitamin D supplement.   History of chronic kidney disease - Creatinine 1.29 and GFR 56 on 08/22/18.  We will continue to monitor.   Orders: Orders Placed This Encounter  Procedures   CBC with Differential/Platelet   CK   No orders of the defined types were placed in this encounter.     Follow-Up Instructions: Return  in about 5 months (around 02/06/2019) for Polymyositis, Sjogren's syndrome, Osteoarthritis.   Ofilia Neas, PA-C  Note - This record has been created using Dragon software.  Chart creation errors have been sought, but may not always  have been located. Such creation errors do not reflect on  the standard of medical care.

## 2018-08-24 NOTE — Research (Signed)
CADFEM Informed Consent                  Subject Name:   Connie Brady   Subject met inclusion and exclusion criteria.  The informed consent form, study requirements and expectations were reviewed with the subject and questions and concerns were addressed prior to the signing of the consent form.  The subject verbalized understanding of the trial requirements.  The subject agreed to participate in the CADFEM trial and signed the informed consent.  The informed consent was obtained prior to performance of any protocol-specific procedures for the subject.  A copy of the signed informed consent was given to the subject and a copy was placed in the subject's medical record.   [place] Chalmers, Research Assistant  08/24/2018 10:59 a.m.

## 2018-09-01 ENCOUNTER — Other Ambulatory Visit: Payer: Self-pay | Admitting: Rheumatology

## 2018-09-01 NOTE — Telephone Encounter (Signed)
Last Visit:03/10/2018 Next Visit:09/06/18 Labs: 06/13/18 CK WNL. CBC stable Creatinine is elevated. Rest of CMP WNL.  Okay to refill per Dr. Estanislado Pandy

## 2018-09-06 ENCOUNTER — Encounter: Payer: Self-pay | Admitting: Physician Assistant

## 2018-09-06 ENCOUNTER — Other Ambulatory Visit: Payer: Self-pay

## 2018-09-06 ENCOUNTER — Ambulatory Visit: Payer: BC Managed Care – PPO | Admitting: Physician Assistant

## 2018-09-06 VITALS — BP 107/70 | HR 92 | Resp 14 | Ht 68.0 in | Wt 228.6 lb

## 2018-09-06 DIAGNOSIS — M3320 Polymyositis, organ involvement unspecified: Secondary | ICD-10-CM

## 2018-09-06 DIAGNOSIS — M170 Bilateral primary osteoarthritis of knee: Secondary | ICD-10-CM | POA: Diagnosis not present

## 2018-09-06 DIAGNOSIS — Z8639 Personal history of other endocrine, nutritional and metabolic disease: Secondary | ICD-10-CM

## 2018-09-06 DIAGNOSIS — Z7952 Long term (current) use of systemic steroids: Secondary | ICD-10-CM

## 2018-09-06 DIAGNOSIS — M3501 Sicca syndrome with keratoconjunctivitis: Secondary | ICD-10-CM | POA: Diagnosis not present

## 2018-09-06 DIAGNOSIS — Z87448 Personal history of other diseases of urinary system: Secondary | ICD-10-CM

## 2018-09-06 DIAGNOSIS — Z79899 Other long term (current) drug therapy: Secondary | ICD-10-CM | POA: Diagnosis not present

## 2018-09-06 DIAGNOSIS — M5136 Other intervertebral disc degeneration, lumbar region: Secondary | ICD-10-CM

## 2018-09-06 DIAGNOSIS — M19071 Primary osteoarthritis, right ankle and foot: Secondary | ICD-10-CM

## 2018-09-06 DIAGNOSIS — M160 Bilateral primary osteoarthritis of hip: Secondary | ICD-10-CM

## 2018-09-06 DIAGNOSIS — M19072 Primary osteoarthritis, left ankle and foot: Secondary | ICD-10-CM

## 2018-09-06 NOTE — Patient Instructions (Signed)
Standing Labs We placed an order today for your standing lab work.    Please come back and get your standing labs in October and every 3 months   We have open lab daily Monday through Thursday from 8:30-12:30 PM and 1:30-4:30 PM and Friday from 8:30-12:30 PM and 1:30 -4:00 PM at the office of Dr. Shaili Deveshwar.   You may experience shorter wait times on Monday and Friday afternoons. The office is located at [address], [address], [place], [place] [address] No appointment is necessary.   Labs are drawn by [facility].  You may receive a bill from [facility] for your lab work.  If you wish to have your labs drawn at another location, please call the office 24 hours in advance to send orders.  If you have any questions regarding directions or hours of operation,  please call [phone].   Just as a reminder please drink plenty of water prior to coming for your lab work. Thanks!   

## 2018-09-07 ENCOUNTER — Telehealth: Payer: Self-pay | Admitting: *Deleted

## 2018-09-07 DIAGNOSIS — M3320 Polymyositis, organ involvement unspecified: Secondary | ICD-10-CM

## 2018-09-07 LAB — CBC WITH DIFFERENTIAL/PLATELET
Absolute Monocytes: 403 cells/uL (ref 200–950)
Basophils Absolute: 38 cells/uL (ref 0–200)
Basophils Relative: 0.6 %
Eosinophils Absolute: 13 cells/uL — ABNORMAL LOW (ref 15–500)
Eosinophils Relative: 0.2 %
HCT: 37.8 % (ref 35.0–45.0)
Hemoglobin: 12.6 g/dL (ref 11.7–15.5)
Lymphs Abs: 1792 cells/uL (ref 850–3900)
MCH: 28.6 pg (ref 27.0–33.0)
MCHC: 33.3 g/dL (ref 32.0–36.0)
MCV: 85.7 fL (ref 80.0–100.0)
MPV: 9.6 fL (ref 7.5–12.5)
Monocytes Relative: 6.3 %
Neutro Abs: 4154 cells/uL (ref 1500–7800)
Neutrophils Relative %: 64.9 %
Platelets: 229 10*3/uL (ref 140–400)
RBC: 4.41 10*6/uL (ref 3.80–5.10)
RDW: 12.8 % (ref 11.0–15.0)
Total Lymphocyte: 28 %
WBC: 6.4 10*3/uL (ref 3.8–10.8)

## 2018-09-07 LAB — CK: Total CK: 155 U/L — ABNORMAL HIGH (ref 29–143)

## 2018-09-07 NOTE — Telephone Encounter (Signed)
-----   Message from Ofilia Neas, PA-C sent at 09/07/2018 12:14 PM EDT ----- Absolute eosinophils are borderline low but trending up. Rest of CBC WNL.  We will continue to monitor.  CK is borderline elevated-155.  I spoke with Dr. Estanislado Pandy and she would the patient to continue on the current treatment regimen and recheck CK in 1 month.

## 2018-09-07 NOTE — Progress Notes (Signed)
Absolute eosinophils are borderline low but trending up. Rest of CBC WNL.  We will continue to monitor.  CK is borderline elevated-155.  I spoke with Dr. Estanislado Pandy and she would the patient to continue on the current treatment regimen and recheck CK in 1 month.

## 2018-09-08 ENCOUNTER — Other Ambulatory Visit: Payer: Self-pay | Admitting: Rheumatology

## 2018-09-08 NOTE — Telephone Encounter (Signed)
Last Visit: 09/06/18 Next Visit: 02/06/19  Okay to refill per Dr. Estanislado Pandy

## 2018-11-23 ENCOUNTER — Other Ambulatory Visit: Payer: Self-pay | Admitting: Rheumatology

## 2018-11-23 NOTE — Telephone Encounter (Signed)
Last Visit: 09/06/18 Next Visit: 02/06/19  Okay to refill per Dr. Deveshwar  

## 2018-12-12 ENCOUNTER — Other Ambulatory Visit: Payer: Self-pay | Admitting: Rheumatology

## 2018-12-12 NOTE — Telephone Encounter (Addendum)
Last Visit: 09/06/18 Next Visit: 02/06/19 Labs: 09/06/18 Absolute eosinophils are borderline low but trending up. Rest of CBC WNL. Creat 1.29 GFR 56   Patient advised she is due to update labs. Patient states she will be in this week to update labs.   Okay to refill per Dr. Estanislado Pandy

## 2018-12-13 ENCOUNTER — Other Ambulatory Visit: Payer: Self-pay | Admitting: *Deleted

## 2018-12-13 DIAGNOSIS — Z79899 Other long term (current) drug therapy: Secondary | ICD-10-CM

## 2018-12-13 DIAGNOSIS — M3320 Polymyositis, organ involvement unspecified: Secondary | ICD-10-CM

## 2018-12-14 LAB — COMPLETE METABOLIC PANEL WITH GFR
AG Ratio: 1.4 (calc) (ref 1.0–2.5)
ALT: 9 U/L (ref 6–29)
AST: 14 U/L (ref 10–35)
Albumin: 3.6 g/dL (ref 3.6–5.1)
Alkaline phosphatase (APISO): 54 U/L (ref 37–153)
BUN/Creatinine Ratio: 11 (calc) (ref 6–22)
BUN: 14 mg/dL (ref 7–25)
CO2: 26 mmol/L (ref 20–32)
Calcium: 8.9 mg/dL (ref 8.6–10.4)
Chloride: 106 mmol/L (ref 98–110)
Creat: 1.31 mg/dL — ABNORMAL HIGH (ref 0.50–1.05)
GFR, Est African American: 55 mL/min/{1.73_m2} — ABNORMAL LOW (ref >=60)
GFR, Est Non African American: 47 mL/min/{1.73_m2} — ABNORMAL LOW (ref >=60)
Globulin: 2.6 g/dL (calc) (ref 1.9–3.7)
Glucose, Bld: 95 mg/dL (ref 65–99)
Potassium: 4 mmol/L (ref 3.5–5.3)
Sodium: 140 mmol/L (ref 135–146)
Total Bilirubin: 0.4 mg/dL (ref 0.2–1.2)
Total Protein: 6.2 g/dL (ref 6.1–8.1)

## 2018-12-14 LAB — CBC WITH DIFFERENTIAL/PLATELET
Absolute Monocytes: 409 cells/uL (ref 200–950)
Basophils Absolute: 27 cells/uL (ref 0–200)
Basophils Relative: 0.4 %
Eosinophils Absolute: 7 cells/uL — ABNORMAL LOW (ref 15–500)
Eosinophils Relative: 0.1 %
HCT: 38.3 % (ref 35.0–45.0)
Hemoglobin: 12.4 g/dL (ref 11.7–15.5)
Lymphs Abs: 2097 cells/uL (ref 850–3900)
MCH: 28.2 pg (ref 27.0–33.0)
MCHC: 32.4 g/dL (ref 32.0–36.0)
MCV: 87 fL (ref 80.0–100.0)
MPV: 10 fL (ref 7.5–12.5)
Monocytes Relative: 6.1 %
Neutro Abs: 4161 cells/uL (ref 1500–7800)
Neutrophils Relative %: 62.1 %
Platelets: 234 10*3/uL (ref 140–400)
RBC: 4.4 10*6/uL (ref 3.80–5.10)
RDW: 13.2 % (ref 11.0–15.0)
Total Lymphocyte: 31.3 %
WBC: 6.7 10*3/uL (ref 3.8–10.8)

## 2018-12-14 LAB — CK: Total CK: 180 U/L — ABNORMAL HIGH (ref 29–143)

## 2018-12-14 NOTE — Progress Notes (Signed)
CK is mildly elevated but stable.  Rest of the labs are stable.  Please forward labs to her PCP.

## 2019-01-09 ENCOUNTER — Other Ambulatory Visit: Payer: Self-pay | Admitting: Rheumatology

## 2019-01-09 NOTE — Telephone Encounter (Signed)
Last Visit: 09/06/2018 Next Visit: 02/06/2019 Labs: 12/13/2018 CK is mildly elevated but stable. Rest of the labs are stable.   Okay to refill per Dr. Estanislado Pandy.

## 2019-01-18 ENCOUNTER — Other Ambulatory Visit (HOSPITAL_COMMUNITY): Payer: Self-pay | Admitting: Specialist

## 2019-01-18 ENCOUNTER — Other Ambulatory Visit: Payer: Self-pay

## 2019-01-18 ENCOUNTER — Ambulatory Visit (HOSPITAL_COMMUNITY)
Admission: RE | Admit: 2019-01-18 | Discharge: 2019-01-18 | Disposition: A | Discharge status: Home / Self Care | Payer: BC Managed Care – PPO | Source: Ambulatory Visit | Attending: Cardiovascular Disease | Admitting: Cardiovascular Disease

## 2019-01-18 DIAGNOSIS — M79605 Pain in left leg: Secondary | ICD-10-CM | POA: Diagnosis present

## 2019-01-18 DIAGNOSIS — M79662 Pain in left lower leg: Secondary | ICD-10-CM | POA: Diagnosis present

## 2019-02-01 NOTE — Progress Notes (Signed)
Virtual Visit via Video Note  I connected with Connie Brady on 02/06/19 at 10:30 AM EST by a video enabled telemedicine application and verified that I am speaking with the correct person using two identifiers.  Location: Patient: Home  Provider: Clinic  This service was conducted via virtual visit.  Both audio and visual tools were used.  The patient was located at home. I was located in my office.  Consent was obtained prior to the virtual visit and is aware of possible charges through their insurance for this visit.  The patient is an established patient.  Dr. Estanislado Pandy, MD conducted the virtual visit and [name], PA-C acted as scribe during the service.  Office staff helped with scheduling follow up visits after the service was conducted.     I discussed the limitations of evaluation and management by telemedicine and the availability of in person appointments. The patient expressed understanding and agreed to proceed.  CC: Left knee pain  History of Present Illness: Patient is a 48 year old female with past medical history of polymyositis, Sjgren's, and osteoarthritis.  She is taking Imuran 50 mg 2 tablets daily and prednisone 7 mg po daily.   She denies any increased muscle weakness.  She has no difficulty raising her arms above her head but she does have difficulty getting up from a chair due to pain in the left knee joint. She did not notice any improvement after after visco injections.  She is planning on proceeding with a left knee total arthroplasty with Dr. Theda Sers in the future.  She continues to have chronic sicca symptoms.  She takes pilocarpine 5 mg 1 tablet daily and uses mouthwash for symptomatic relief.   Review of Systems  Constitutional: Negative for fever and malaise/fatigue.  HENT:       +Dry mouth  Eyes: Negative for photophobia, pain, discharge and redness.       +Dry eyes  Respiratory: Negative for cough, shortness of breath and wheezing.   Cardiovascular:  Negative for chest pain and palpitations.  Gastrointestinal: Negative for blood in stool, constipation and diarrhea.  Genitourinary: Negative for dysuria.  Musculoskeletal: Positive for joint pain. Negative for back pain, myalgias and neck pain.       +Muscle cramps  Skin: Negative for rash.  Neurological: Negative for dizziness and headaches.  Psychiatric/Behavioral: Negative for depression. The patient is not nervous/anxious and does not have insomnia.       Observations/Objective: Physical Exam   Patient reports morning stiffness for 10 minutes.   Patient reports nocturnal pain.  Difficulty dressing/grooming: Denies Difficulty climbing stairs: Reports Difficulty getting out of chair: Reports Difficulty using hands for taps, buttons, cutlery, and/or writing: Denies    Assessment and Plan: Visit Diagnoses: Polymyositis ([place]) - History of elevated CK, positive muscle biopsy, myositis panel negative, initial CK 522: She has not had any signs or symptoms of a polymyositis flare recently.  She has not noticed any increased muscle weakness. She is taking Imuran 50 mg 1 tablet twice daily and prednisone 7 mg po daily.  She has been exercising on a regular basis.  She is planing on having her left knee replaced in 1-2 months.  She was advised to have her CK checked prior to the knee replacement so it will be a more accurate representation of her disease activity. Standing orders for CK placed today. She will continue on the current treatment regimen.  She was advised to notify us if she develops any new or  worsening symptoms.  She will follow up in 3-4 months.   High risk medication use -Imuran 50 mg 1 tablet twice daily prednisone 7 mg daily.  CBC and CMP were drawn on 12/13/18.    Sjogren's syndrome with keratoconjunctivitis sicca (HCC) - ANA 1:40 centromere, +SSA: She has chronic symptoms.  She takes pilocarpine 5 mg 1 tablet daily and uses OTC products for symptomatic relief.    Primary  osteoarthritis of both knees -She has chronic left knee joint pain. No inflammation at this time.  She had Visco gel injections in bilateral knee joints in June 2020, but did not notice any improvement in the left knee joint.  She has been riding a stationary bike and working on lower extremity muscle strengthening. She plans on proceeding with a left knee joint replacement performed by Dr. Theda Sers in the next month.  Primary osteoarthritis of both hips -She has no hip joint pain at this time. She has been exercising on a regular basis.   Primary osteoarthritis of both feet: She has no joint pain or inflammation at this time.  She has been experiencing muscle cramps in her feet, especially at night.   DDD (degenerative disc disease), lumbar -She is not having any lower back pain at this time.  No symptoms of radiculopathy.   On prednisone therapy - She is taking prednisone 7 mg po daily.   History of vitamin D deficiency - She was encouraged to continue taking a maintenance dose of vitamin D.   History of chronic kidney disease - Creatinine is elevated 1.31 and GFR was 55 on 12/13/18.  She has seen Dr. Moshe Cipro in the past.    Follow Up Instructions: She will follow up in 3 months.    I discussed the assessment and treatment plan with the patient. The patient was provided an opportunity to ask questions and all were answered. The patient agreed with the plan and demonstrated an understanding of the instructions.   The patient was advised to call back or seek an in-person evaluation if the symptoms worsen or if the condition fails to improve as anticipated.  I provided 15 minutes of non-face-to-face time during this encounter.   Bo Merino, MD   Scribed by-  [name], PA-C

## 2019-02-02 ENCOUNTER — Telehealth: Payer: Self-pay | Admitting: Rheumatology

## 2019-02-02 NOTE — Telephone Encounter (Signed)
Patient called stating she is scheduling an appointment with Dr. Theda Sers at [facility] to discuss possible left knee replacement surgery.  Patient is requesting her labwork results from October be sent to their office.  Patient is also requesting a return call from Dr. Estanislado Pandy to let her know if she would recommend knee replacement surgery.

## 2019-02-03 NOTE — Telephone Encounter (Signed)
Lab results have been faxed.   Please advise about knee replacement.

## 2019-02-03 NOTE — Telephone Encounter (Signed)
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  She will have to discuss this further with Dr. Theda Sers.  She may consider doing some regular exercise to strengthen her muscles.  If she has surgery I would stop Imuran a week prior to surgery and restart a week later if there are no signs of infection.  She will have to continue prednisone throughout the surgery.

## 2019-02-06 ENCOUNTER — Telehealth (INDEPENDENT_AMBULATORY_CARE_PROVIDER_SITE_OTHER): Payer: BC Managed Care – PPO | Admitting: Rheumatology

## 2019-02-06 ENCOUNTER — Other Ambulatory Visit: Payer: Self-pay

## 2019-02-06 ENCOUNTER — Encounter: Payer: Self-pay | Admitting: Rheumatology

## 2019-02-06 DIAGNOSIS — M3320 Polymyositis, organ involvement unspecified: Secondary | ICD-10-CM | POA: Diagnosis not present

## 2019-02-06 DIAGNOSIS — Z87448 Personal history of other diseases of urinary system: Secondary | ICD-10-CM

## 2019-02-06 DIAGNOSIS — M5136 Other intervertebral disc degeneration, lumbar region: Secondary | ICD-10-CM

## 2019-02-06 DIAGNOSIS — Z79899 Other long term (current) drug therapy: Secondary | ICD-10-CM

## 2019-02-06 DIAGNOSIS — M19071 Primary osteoarthritis, right ankle and foot: Secondary | ICD-10-CM

## 2019-02-06 DIAGNOSIS — M3501 Sicca syndrome with keratoconjunctivitis: Secondary | ICD-10-CM | POA: Diagnosis not present

## 2019-02-06 DIAGNOSIS — M170 Bilateral primary osteoarthritis of knee: Secondary | ICD-10-CM

## 2019-02-06 DIAGNOSIS — Z8639 Personal history of other endocrine, nutritional and metabolic disease: Secondary | ICD-10-CM

## 2019-02-06 DIAGNOSIS — M160 Bilateral primary osteoarthritis of hip: Secondary | ICD-10-CM

## 2019-02-06 DIAGNOSIS — M19072 Primary osteoarthritis, left ankle and foot: Secondary | ICD-10-CM

## 2019-02-06 DIAGNOSIS — Z7952 Long term (current) use of systemic steroids: Secondary | ICD-10-CM

## 2019-02-08 ENCOUNTER — Telehealth: Payer: Self-pay | Admitting: Rheumatology

## 2019-02-08 NOTE — Telephone Encounter (Signed)
Patient will call back on Monday, 02/13/19 to schedule her 3-4 month follow-up appointment.

## 2019-02-08 NOTE — Telephone Encounter (Signed)
-----   Message from Shona Needles, RT sent at 02/06/2019  2:32 PM EST ----- Regarding: 3-4 MONTH F/U

## 2019-02-26 ENCOUNTER — Other Ambulatory Visit: Payer: Self-pay | Admitting: Rheumatology

## 2019-02-27 NOTE — Telephone Encounter (Signed)
Last Visit: 02/06/2019 telemedicine  Next Visit: message sent to the front desk to schedule.   Okay to refill per Dr. Estanislado Pandy.

## 2019-03-14 ENCOUNTER — Other Ambulatory Visit: Payer: Self-pay | Admitting: Rheumatology

## 2019-03-14 NOTE — Telephone Encounter (Signed)
Please schedule patient for a follow up visit. Patient due March 2021. Thanks!  

## 2019-03-14 NOTE — Telephone Encounter (Signed)
Last Visit: 02/06/2019 telemedicine  Next Visit: message sent to the front desk to schedule.  Okay to refill per Dr. Estanislado Pandy

## 2019-03-20 ENCOUNTER — Other Ambulatory Visit: Payer: Self-pay | Admitting: Rheumatology

## 2019-03-21 NOTE — Telephone Encounter (Signed)
Last Visit: 02/06/2019 telemedicine  Next Visit: due March 2021. Front has message to schedule patient.   Okay to refill per Dr. Estanislado Pandy

## 2019-03-29 ENCOUNTER — Telehealth: Payer: Self-pay | Admitting: Rheumatology

## 2019-03-29 NOTE — Telephone Encounter (Signed)
Patient having a TKR on knee this coming week. Patient needs to know which medication she is to stop, and when? Please call to advise.

## 2019-03-29 NOTE — H&P (Addendum)
TOTAL KNEE ADMISSION H&P  Patient is being admitted for left total knee arthroplasty.  Subjective:  Chief Complaint:left knee pain.  HPI: Connie Brady, 49 y.o. female, has a history of pain and functional disability in the left knee due to arthritis and has failed non-surgical conservative treatments for greater than 12 weeks to includeNSAID's and/or analgesics, corticosteriod injections, viscosupplementation injections, flexibility and strengthening excercises, supervised PT with diminished ADL's post treatment and activity modification.  Onset of symptoms was gradual, starting 5 years ago with gradually worsening course since that time. The patient noted prior procedures on the knee to include  arthroscopy and menisectomy on the left knee(s).  Patient currently rates pain in the left knee(s) at 9 out of 10 with activity. Patient has night pain, worsening of pain with activity and weight bearing, pain that interferes with activities of daily living, pain with passive range of motion and joint swelling.  Patient has evidence of subchondral cysts, subchondral sclerosis, periarticular osteophytes and joint space narrowing by imaging studies. This patient has had no previous injury. There is no active infection.  Patient Active Problem List   Diagnosis Date Noted  . Left lateral knee pain 08/24/2017  . S/P arthroscopic surgery of left knee 08/24/2017  . Abnormal cervical Papanicolaou smear 08/31/2016  . Obesity 08/31/2016  . Premenstrual symptom 08/31/2016  . Vitamin D deficiency 08/25/2016  . History of chronic kidney disease 05/07/2016  . Chronic kidney disease (CKD), stage III (moderate) 03/30/2016  . High risk medication use 01/10/2016  . Primary osteoarthritis of both hips 01/10/2016  . Primary osteoarthritis of both knees 01/10/2016  . Primary osteoarthritis of both feet 01/10/2016  . Bilateral plantar fasciitis 01/10/2016  . Chest pain 01/26/2013  . 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 Sjoegren syndrome    Past Medical History:  Diagnosis Date  . Anemia   . Polymyositis ([place])   . Sjoegren syndrome     Past Surgical History:  Procedure Laterality Date  . BUNIONECTOMY Bilateral   . KNEE ARTHROPLASTY Left 2018   meniscal tear  . KNEE ARTHROSCOPY WITH LATERAL MENISECTOMY Left 08/24/2017   Procedure: LEFT KNEE ARTHROSCOPY WITH LATERAL MENISECTOMY, CHONDROPLASTY, ARTHROSCOPIC ASSISTED INTERNAL FIXATION LATERAL TIBIAL PLATEAU;  Surgeon: Sydnee Cabal, MD;  Location: [facility];  Service: Orthopedics;  Laterality: Left;  . KNEE SURGERY     right  . MUSCLE BIOPSY     r/thigh  . PLANTAR FASCIA SURGERY Right   . TOTAL SHOULDER ARTHROPLASTY Left 01/12/2017    No current facility-administered medications for this encounter.   Current Outpatient Medications  Medication Sig Dispense Refill Last Dose  . acidophilus (RISAQUAD) CAPS capsule Take 1 capsule by mouth daily.     Marland Kitchen azaTHIOprine (IMURAN) 50 MG tablet TAKE 1 TABLET BY MOUTH TWICE DAILY (Patient taking differently: Take 50 mg by mouth 2 (two) times daily. ) 180 tablet 0   . clonazePAM (KLONOPIN) 1 MG tablet Take 1 mg by mouth at bedtime.      Marland Kitchen dexlansoprazole (DEXILANT) 60 MG capsule Take 60 mg by mouth daily.     . DULoxetine (CYMBALTA) 60 MG capsule Take 60 mg by mouth daily.      Marland Kitchen ELDERBERRY PO Take 1 capsule by mouth daily.     Marland Kitchen gabapentin (NEURONTIN) 300 MG capsule TAKE 1 CAPSULE(300 MG) BY MOUTH TWICE DAILY (Patient taking differently: Take 300 mg by mouth 2 (two) times daily. ) 180 capsule 0   . lidocaine (XYLOCAINE) 5 % ointment Apply  to affected area daily as needed (Patient taking differently: Apply 1 application topically daily as needed for moderate pain. ) 35.44 g 5   . LOPREEZA 1-0.5 MG tablet Take 1 tablet by mouth daily.   12   . Multiple Vitamin (MULTIVITAMIN WITH MINERALS) TABS tablet Take 1 tablet by mouth daily.     . pilocarpine (SALAGEN) 5 MG tablet TAKE 1 TABLET BY MOUTH  THREE TIMES DAILY AS NEEDED (Patient taking differently: Take 5 mg by mouth 3 (three) times daily as needed. TAKE 1 TABLET BY MOUTH THREE TIMES DAILY AS NEEDED) 90 tablet 0   . Polyethyl Glycol-Propyl Glycol (SYSTANE OP) Place 1 drop into both eyes 2 (two) times daily as needed (dry eyes).     . predniSONE (DELTASONE) 1 MG tablet TAKE 2 TABLETS BY MOUTH WITH 5MG  TABLET DAILY TO EQUAL 7MG  (Patient taking differently: Take 2 mg by mouth daily with breakfast. ) 180 tablet 0   . traZODone (DESYREL) 100 MG tablet Take 100 mg by mouth at bedtime.      . vitamin B-12 (CYANOCOBALAMIN) 1000 MCG tablet Take 1,000 mcg by mouth daily.     . predniSONE (DELTASONE) 5 MG tablet TAKE 1 TABLET BY MOUTH DAILY WITH BREAKFAST (Patient taking differently: Take 5 mg by mouth daily with breakfast. ) 90 tablet 0    Allergies  Allergen Reactions  . Lyrica [Pregabalin] Itching  . Percocet [Oxycodone-Acetaminophen] Rash and Other (See Comments)    Hallucinations     Social History   Tobacco Use  . Smoking status: Never Smoker  . Smokeless tobacco: Never Used  Substance Use Topics  . Alcohol use: No    Family History  Problem Relation Age of Onset  . Hypertension Sister   . Hypertension Brother   . Diabetes Brother   . Hypertension Sister   . Hypertension Sister   . Hypertension Brother   . Diabetes Brother      Review of Systems  Constitutional: Negative.   HENT: Negative.   Eyes: Negative.   Respiratory: Negative.   Cardiovascular: Negative.   Gastrointestinal: Negative.   Endocrine: Negative.   Genitourinary: Negative.   Musculoskeletal: Positive for arthralgias, gait problem and joint swelling.  Skin: Negative.   Allergic/Immunologic: Negative.   Hematological: Negative.   Psychiatric/Behavioral: Negative.     Objective:  Physical Exam  Constitutional: She is oriented to person, place, and time. She appears well-developed and well-nourished.  HENT:  Head: Normocephalic and atraumatic.   Eyes: Pupils are equal, round, and reactive to light. EOM are normal.  Neck: No JVD present.  Cardiovascular: Normal rate, regular rhythm, normal heart sounds and intact distal pulses. Exam reveals no gallop and no friction rub.  No murmur heard. Respiratory: Effort normal and breath sounds normal. No respiratory distress. She has no wheezes. She has no rales. She exhibits no tenderness.  GI: There is no abdominal tenderness. There is no guarding.  Musculoskeletal:     Cervical back: Normal range of motion and neck supple.     Comments: Tenderness over the medial and lateral joint line Range of motion 0 to about 95 5 out of 5 strength with resisted knee flexion and extension No laxity with varus or valgus pressure Neurovascularly intact in left lower extremity  Neurological: She is alert and oriented to person, place, and time.  Skin: Skin is warm and dry. No rash noted. No erythema. No pallor.  Psychiatric: She has a normal mood and affect. Her behavior is normal. Judgment  and thought content normal.    Vital signs in last 24 hours: BP: ()/()  Arterial Line BP: ()/()   Labs:   Estimated body mass index is 34.76 kg/m as calculated from the following:   Height as of 09/06/18: 5\' 8"  (1.727 m).   Weight as of 09/06/18: 103.7 kg.   Imaging Review Plain radiographs demonstrate severe degenerative joint disease of the left knee(s). The overall alignment ismild varus. The bone quality appears to be fair for age and reported activity level.      Assessment/Plan:  End stage arthritis, left knee   The patient history, physical examination, clinical judgment of the provider and imaging studies are consistent with end stage degenerative joint disease of the left knee(s) and total knee arthroplasty is deemed medically necessary. The treatment options including medical management, injection therapy arthroscopy and arthroplasty were discussed at length. The risks and benefits of total  knee arthroplasty were presented and reviewed. The risks due to aseptic loosening, infection, stiffness, patella tracking problems, thromboembolic complications and other imponderables were discussed. The patient acknowledged the explanation, agreed to proceed with the plan and consent was signed. Patient is being admitted for inpatient treatment for surgery, pain control, PT, OT, prophylactic antibiotics, VTE prophylaxis, progressive ambulation and ADL's and discharge planning. The patient is planning to be discharged to inpatient rehab Patient is allergic to percocet, so she will need to go home with Dilaudid and Tramadol Patient will be doing out patient PT.

## 2019-03-30 NOTE — Telephone Encounter (Signed)
Patient advised per Dr. Estanislado Pandy, she should hold Imuran 1 week before and 1 week after surgery. Patient advised to continue Prednisone.

## 2019-04-03 NOTE — Patient Instructions (Addendum)
DUE TO COVID-19 ONLY ONE VISITOR IS ALLOWED TO COME WITH YOU AND STAY IN THE WAITING ROOM ONLY DURING PRE OP AND PROCEDURE DAY OF SURGERY. THE 1 VISITOR MAY VISIT WITH YOU AFTER SURGERY IN YOUR PRIVATE ROOM DURING VISITING HOURS ONLY!  YOU NEED TO HAVE A COVID 19 TEST ON__02/16/2021_____ @___9 :20 am____, THIS TEST MUST BE DONE BEFORE SURGERY, COME  [address], [place] , [address].  ([place]) ONCE YOUR COVID TEST IS COMPLETED, PLEASE BEGIN THE QUARANTINE INSTRUCTIONS AS OUTLINED IN YOUR HANDOUT.                Connie Brady     Your procedure is scheduled on: Friday 04/07/2019   Report to [facility] Main  Entrance    Report to Short Stay  at   0530  AM     Call this number if you have problems the morning of surgery [phone]    Remember: Do not eat food :After Midnight.     NO SOLID FOOD AFTER MIDNIGHT THE NIGHT PRIOR TO SURGERY   AND   NOTHING BY MOUTH EXCEPT CLEAR LIQUIDS UNTIL  0430 am .     PLEASE FINISH ENSURE DRINK PER SURGEON ORDER  WHICH NEEDS TO BE COMPLETED AT  0430 am .   CLEAR LIQUID DIET   Foods Allowed                                                                     Foods Excluded  Coffee and tea, regular and decaf                             liquids that you cannot  Plain Jell-O any favor except red or purple                                           see through such as: Fruit ices (not with fruit pulp)                                     milk, soups, orange juice  Iced Popsicles                                    All solid food Carbonated beverages, regular and diet                                    Cranberry, grape and apple juices Sports drinks like Gatorade Lightly seasoned clear broth or consume(fat free) Sugar, honey syrup  Sample Menu Breakfast                                Lunch  Supper Cranberry juice                    Beef broth                            Chicken  broth Jell-O                                     Grape juice                           Apple juice Coffee or tea                        Jell-O                                      Popsicle                                                Coffee or tea                        Coffee or tea  _____________________________________________________________________     BRUSH YOUR TEETH MORNING OF SURGERY AND RINSE YOUR MOUTH OUT, NO CHEWING GUM CANDY OR MINTS.     Take these medicines the morning of surgery with A SIP OF WATER:  Duloxetine (Cymbalta), Prednisone (Deltasonea), Dexlansoprazole (Dexilant)                                 You may not have any metal on your body including hair pins and              piercings  Do not wear jewelry, make-up, lotions, powders or perfumes, deodorant             Do not wear nail polish on your fingernails.  Do not shave  48 hours prior to surgery.                Do not bring valuables to the hospital. [place].  Contacts, dentures or bridgework may not be worn into surgery.  Leave suitcase in the car. After surgery it may be brought to your room.                  Please read over the following fact sheets you were given: _____________________________________________________________________             [facility] - Preparing for Surgery Before surgery, you can play an important role.  Because skin is not sterile, your skin needs to be as free of germs as possible.  You can reduce the number of germs on your skin by washing with CHG (chlorahexidine gluconate) soap before surgery.  CHG is an antiseptic cleaner which kills germs and bonds with the skin to continue killing germs even after washing. Please DO NOT use if you have an allergy to CHG  or antibacterial soaps.  If your skin becomes reddened/irritated stop using the CHG and inform your nurse when you arrive at Short Stay. Do not shave (including  legs and underarms) for at least 48 hours prior to the first CHG shower.  You may shave your face/neck. Please follow these instructions carefully:  1.  Shower with CHG Soap the night before surgery and the  morning of Surgery.  2.  If you choose to wash your hair, wash your hair first as usual with your  normal  shampoo.  3.  After you shampoo, rinse your hair and body thoroughly to remove the  shampoo.                           4.  Use CHG as you would any other liquid soap.  You can apply chg directly  to the skin and wash                       Gently with a scrungie or clean washcloth.  5.  Apply the CHG Soap to your body ONLY FROM THE NECK DOWN.   Do not use on face/ open                           Wound or open sores. Avoid contact with eyes, ears mouth and genitals (private parts).                       Wash face,  Genitals (private parts) with your normal soap.             6.  Wash thoroughly, paying special attention to the area where your surgery  will be performed.  7.  Thoroughly rinse your body with warm water from the neck down.  8.  DO NOT shower/wash with your normal soap after using and rinsing off  the CHG Soap.                9.  Pat yourself dry with a clean towel.            10.  Wear clean pajamas.            11.  Place clean sheets on your bed the night of your first shower and do not  sleep with pets. Day of Surgery : Do not apply any lotions/deodorants the morning of surgery.  Please wear clean clothes to the hospital/surgery center.  FAILURE TO FOLLOW THESE INSTRUCTIONS MAY RESULT IN THE CANCELLATION OF YOUR SURGERY PATIENT SIGNATURE_________________________________  NURSE SIGNATURE__________________________________  ________________________________________________________________________   [name]  An incentive spirometer is a tool that can help keep your lungs clear and active. This tool measures how well you are filling your lungs with each breath.  Taking long deep breaths may help reverse or decrease the chance of developing breathing (pulmonary) problems (especially infection) following:  A long period of time when you are unable to move or be active. BEFORE THE PROCEDURE   If the spirometer includes an indicator to show your best effort, your nurse or respiratory therapist will set it to a desired goal.  If possible, sit up straight or lean slightly forward. Try not to slouch.  Hold the incentive spirometer in an upright position. INSTRUCTIONS FOR USE  1. Sit on the edge of your bed if possible, or sit up as far as you can  in bed or on a chair. 2. Hold the incentive spirometer in an upright position. 3. Breathe out normally. 4. Place the mouthpiece in your mouth and seal your lips tightly around it. 5. Breathe in slowly and as deeply as possible, raising the piston or the ball toward the top of the column. 6. Hold your breath for 3-5 seconds or for as long as possible. Allow the piston or ball to fall to the bottom of the column. 7. Remove the mouthpiece from your mouth and breathe out normally. 8. Rest for a few seconds and repeat Steps 1 through 7 at least 10 times every 1-2 hours when you are awake. Take your time and take a few normal breaths between deep breaths. 9. The spirometer may include an indicator to show your best effort. Use the indicator as a goal to work toward during each repetition. 10. After each set of 10 deep breaths, practice coughing to be sure your lungs are clear. If you have an incision (the cut made at the time of surgery), support your incision when coughing by placing a pillow or rolled up towels firmly against it. Once you are able to get out of bed, walk around indoors and cough well. You may stop using the incentive spirometer when instructed by your caregiver.  RISKS AND COMPLICATIONS  Take your time so you do not get dizzy or light-headed.  If you are in pain, you may need to take or ask for pain  medication before doing incentive spirometry. It is harder to take a deep breath if you are having pain. AFTER USE  Rest and breathe slowly and easily.  It can be helpful to keep track of a log of your progress. Your caregiver can provide you with a simple table to help with this. If you are using the spirometer at home, follow these instructions: [place] IF:   You are having difficultly using the spirometer.  You have trouble using the spirometer as often as instructed.  Your pain medication is not giving enough relief while using the spirometer.  You develop fever of 100.5 F (38.1 C) or higher. SEEK IMMEDIATE MEDICAL CARE IF:   You cough up bloody sputum that had not been present before.  You develop fever of 102 F (38.9 C) or greater.  You develop worsening pain at or near the incision site. MAKE SURE YOU:   Understand these instructions.  Will watch your condition.  Will get help right away if you are not doing well or get worse. Document Released: 06/15/2006 Document Revised: 04/27/2011 Document Reviewed: 08/16/2006 Exit[employer] Patient Information 2014 [employer], [place].   ________________________________________________________________________  WHAT IS A BLOOD TRANSFUSION? Blood Transfusion Information  A transfusion is the replacement of blood or some of its parts. Blood is made up of multiple cells which provide different functions.  Red blood cells carry oxygen and are used for blood loss replacement.  White blood cells fight against infection.  Platelets control bleeding.  Plasma helps clot blood.  Other blood products are available for specialized needs, such as hemophilia or other clotting disorders. BEFORE THE TRANSFUSION  Who gives blood for transfusions?   Healthy volunteers who are fully evaluated to make sure their blood is safe. This is blood bank blood. Transfusion therapy is the safest it has ever been in the practice of medicine.  Before blood is taken from a donor, a complete history is taken to make sure that person has no history of diseases nor engages  in risky social behavior (examples are intravenous drug use or sexual activity with multiple partners). The donor's travel history is screened to minimize risk of transmitting infections, such as malaria. The donated blood is tested for signs of infectious diseases, such as HIV and hepatitis. The blood is then tested to be sure it is compatible with you in order to minimize the chance of a transfusion reaction. If you or a relative donates blood, this is often done in anticipation of surgery and is not appropriate for emergency situations. It takes many days to process the donated blood. RISKS AND COMPLICATIONS Although transfusion therapy is very safe and saves many lives, the main dangers of transfusion include:   Getting an infectious disease.  Developing a transfusion reaction. This is an allergic reaction to something in the blood you were given. Every precaution is taken to prevent this. The decision to have a blood transfusion has been considered carefully by your caregiver before blood is given. Blood is not given unless the benefits outweigh the risks. AFTER THE TRANSFUSION  Right after receiving a blood transfusion, you will usually feel much better and more energetic. This is especially true if your red blood cells have gotten low (anemic). The transfusion raises the level of the red blood cells which carry oxygen, and this usually causes an energy increase.  The nurse administering the transfusion will monitor you carefully for complications. HOME CARE INSTRUCTIONS  No special instructions are needed after a transfusion. You may find your energy is better. Speak with your caregiver about any limitations on activity for underlying diseases you may have. SEEK MEDICAL CARE IF:   Your condition is not improving after your transfusion.  You develop redness or  irritation at the intravenous (IV) site. SEEK IMMEDIATE MEDICAL CARE IF:  Any of the following symptoms occur over the next 12 hours:  Shaking chills.  You have a temperature by mouth above 102 F (38.9 C), not controlled by medicine.  Chest, back, or muscle pain.  People around you feel you are not acting correctly or are confused.  Shortness of breath or difficulty breathing.  Dizziness and fainting.  You get a rash or develop hives.  You have a decrease in urine output.  Your urine turns a dark color or changes to pink, red, or brown. Any of the following symptoms occur over the next 10 days:  You have a temperature by mouth above 102 F (38.9 C), not controlled by medicine.  Shortness of breath.  Weakness after normal activity.  The white part of the eye turns yellow (jaundice).  You have a decrease in the amount of urine or are urinating less often.  Your urine turns a dark color or changes to pink, red, or brown. Document Released: 01/31/2000 Document Revised: 04/27/2011 Document Reviewed: 09/19/2007 [facility] Patient Information 2014 [place], [place].  _______________________________________________________________________

## 2019-04-04 ENCOUNTER — Other Ambulatory Visit: Payer: Self-pay

## 2019-04-04 ENCOUNTER — Other Ambulatory Visit (HOSPITAL_COMMUNITY)
Admission: RE | Admit: 2019-04-04 | Discharge: 2019-04-04 | Disposition: A | Discharge status: Home / Self Care | Payer: BC Managed Care – PPO | Source: Ambulatory Visit | Attending: Specialist | Admitting: Specialist

## 2019-04-04 ENCOUNTER — Other Ambulatory Visit (HOSPITAL_COMMUNITY): Payer: BC Managed Care – PPO

## 2019-04-04 ENCOUNTER — Encounter (HOSPITAL_COMMUNITY)
Admission: RE | Admit: 2019-04-04 | Discharge: 2019-04-04 | Disposition: A | Discharge status: Home / Self Care | Payer: BC Managed Care – PPO | Source: Ambulatory Visit | Attending: Specialist | Admitting: Specialist

## 2019-04-04 ENCOUNTER — Encounter (HOSPITAL_COMMUNITY): Payer: Self-pay

## 2019-04-04 DIAGNOSIS — M3500 Sicca syndrome, unspecified: Secondary | ICD-10-CM | POA: Diagnosis not present

## 2019-04-04 DIAGNOSIS — N183 Chronic kidney disease, stage 3 unspecified: Secondary | ICD-10-CM | POA: Diagnosis not present

## 2019-04-04 DIAGNOSIS — Z79899 Other long term (current) drug therapy: Secondary | ICD-10-CM | POA: Insufficient documentation

## 2019-04-04 DIAGNOSIS — G629 Polyneuropathy, unspecified: Secondary | ICD-10-CM | POA: Diagnosis not present

## 2019-04-04 DIAGNOSIS — M1712 Unilateral primary osteoarthritis, left knee: Secondary | ICD-10-CM | POA: Diagnosis not present

## 2019-04-04 DIAGNOSIS — Z01812 Encounter for preprocedural laboratory examination: Secondary | ICD-10-CM | POA: Insufficient documentation

## 2019-04-04 DIAGNOSIS — D649 Anemia, unspecified: Secondary | ICD-10-CM | POA: Diagnosis not present

## 2019-04-04 DIAGNOSIS — Z20822 Contact with and (suspected) exposure to covid-19: Secondary | ICD-10-CM | POA: Diagnosis not present

## 2019-04-04 HISTORY — DX: Gastro-esophageal reflux disease without esophagitis: K21.9

## 2019-04-04 HISTORY — DX: Chronic kidney disease, unspecified: N18.9

## 2019-04-04 LAB — COMPREHENSIVE METABOLIC PANEL
ALT: 10 U/L (ref 0–44)
AST: 15 U/L (ref 15–41)
Albumin: 3.7 g/dL (ref 3.5–5.0)
Alkaline Phosphatase: 57 U/L (ref 38–126)
Anion gap: 8 (ref 5–15)
BUN: 13 mg/dL (ref 6–20)
CO2: 25 mmol/L (ref 22–32)
Calcium: 8.9 mg/dL (ref 8.9–10.3)
Chloride: 108 mmol/L (ref 98–111)
Creatinine, Ser: 1.46 mg/dL — ABNORMAL HIGH (ref 0.44–1.00)
GFR calc Af Amer: 48 mL/min — ABNORMAL LOW (ref >60)
GFR calc non Af Amer: 42 mL/min — ABNORMAL LOW (ref >60)
Glucose, Bld: 93 mg/dL (ref 70–99)
Potassium: 3.7 mmol/L (ref 3.5–5.1)
Sodium: 141 mmol/L (ref 135–145)
Total Bilirubin: 0.7 mg/dL (ref 0.3–1.2)
Total Protein: 7 g/dL (ref 6.5–8.1)

## 2019-04-04 LAB — CBC
HCT: 40.5 % (ref 36.0–46.0)
Hemoglobin: 12.9 g/dL (ref 12.0–15.0)
MCH: 28.4 pg (ref 26.0–34.0)
MCHC: 31.9 g/dL (ref 30.0–36.0)
MCV: 89 fL (ref 80.0–100.0)
Platelets: 221 10*3/uL (ref 150–400)
RBC: 4.55 MIL/uL (ref 3.87–5.11)
RDW: 13.2 % (ref 11.5–15.5)
WBC: 6.7 10*3/uL (ref 4.0–10.5)
nRBC: 0 % (ref 0.0–0.2)

## 2019-04-04 LAB — SURGICAL PCR SCREEN
MRSA, PCR: NEGATIVE
Staphylococcus aureus: NEGATIVE

## 2019-04-04 LAB — PROTIME-INR
INR: 0.9 (ref 0.8–1.2)
Prothrombin Time: 12.3 seconds (ref 11.4–15.2)

## 2019-04-04 LAB — APTT: aPTT: 24 seconds (ref 24–36)

## 2019-04-04 LAB — ABO/RH: ABO/RH(D): B POS

## 2019-04-04 LAB — SARS CORONAVIRUS 2 (TAT 6-24 HRS): SARS Coronavirus 2: NEGATIVE

## 2019-04-04 NOTE — Progress Notes (Signed)
Anesthesia Chart Review   Case: [id number] Date/Time: 04/07/19 0715   Procedure: TOTAL KNEE ARTHROPLASTY (Left Knee) - adductor canal   Anesthesia type: Spinal   Pre-op diagnosis: Left knee osteoarthritis   Location: [facility] ROOM 07 / [facility]   Surgeons: Sydnee Cabal, MD      DISCUSSION:49 y.o. never smoker with h/o GERD, Sjogren syndrome, CKD Stage III creatinine stable, left knee OA scheduled for above procedure 04/07/19 with Dr. Warnell Bureau.   Pt seen by cardiologist, Dr. Candee Furbish, 06/28/2018 for evaluation of chest pain.  Per OV note, "Atypical, could be musculoskeletal, some chest wall tenderness right of sternum.  Nonetheless, given her history of autoimmune disease, polymyositis, I think would be a good idea to evaluate her with a coronary CT scan with possible FFR analysis.  Hopefully if negative, we can continue with good primary prevention strategies.  If there is coronary calcium present, would strongly consider statin medication to help minimize risk.  Could also consider low-dose aspirin in the situation as well.  We will follow-up with results of study."  Coronary CT negative.    VS: Ht 5\' 8"  (1.727 m)   Wt 102.1 kg   LMP 02/24/2019 (Approximate)   BMI 34.21 kg/m   PROVIDERS: Kelton Pillar, MD is PCP   Candee Furbish, MD is Cardiologist  Dorothea Ogle, MD is Nephrologist  LABS: Labs reviewed: Acceptable for surgery. (all labs ordered are listed, but only abnormal results are displayed)  Labs Reviewed  ABO/RH     IMAGES: CT Coronary 08/24/2018 IMPRESSION: Very small hiatal hernia. No significant extracardiac findings otherwise.  EKG: 06/28/2018 Rate 67 bpm Normal sinus rhythm  Nonspecific T wave abnormality   CV:  Past Medical History:  Diagnosis Date  . Anemia   . Chronic kidney disease    stage 3  . GERD (gastroesophageal reflux disease)   . 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 Sjoegren syndrome     Past Surgical History:   Procedure Laterality Date  . BUNIONECTOMY Bilateral   . KNEE ARTHROPLASTY Left 2018   meniscal tear  . KNEE ARTHROSCOPY WITH LATERAL MENISECTOMY Left 08/24/2017   Procedure: LEFT KNEE ARTHROSCOPY WITH LATERAL MENISECTOMY, CHONDROPLASTY, ARTHROSCOPIC ASSISTED INTERNAL FIXATION LATERAL TIBIAL PLATEAU;  Surgeon: Sydnee Cabal, MD;  Location: [facility];  Service: Orthopedics;  Laterality: Left;  . KNEE SURGERY     right  . MUSCLE BIOPSY     r/thigh  . PLANTAR FASCIA SURGERY Right   . TOTAL SHOULDER ARTHROPLASTY Left 01/12/2017    MEDICATIONS: . estradiol-norethindrone (ACTIVELLA) 1-0.5 MG tablet  . acidophilus (RISAQUAD) CAPS capsule  . azaTHIOprine (IMURAN) 50 MG tablet  . clonazePAM (KLONOPIN) 1 MG tablet  . dexlansoprazole (DEXILANT) 60 MG capsule  . DULoxetine (CYMBALTA) 60 MG capsule  . ELDERBERRY PO  . gabapentin (NEURONTIN) 300 MG capsule  . lidocaine (XYLOCAINE) 5 % ointment  . Multiple Vitamin (MULTIVITAMIN WITH MINERALS) TABS tablet  . pilocarpine (SALAGEN) 5 MG tablet  . Polyethyl Glycol-Propyl Glycol (SYSTANE OP)  . predniSONE (DELTASONE) 1 MG tablet  . predniSONE (DELTASONE) 5 MG tablet  . traZODone (DESYREL) 100 MG tablet  . vitamin B-12 (CYANOCOBALAMIN) 1000 MCG tablet   No current facility-administered medications for this encounter.    [facility] Pre-Surgical Testing [phone] 04/04/19  3:35 PM

## 2019-04-04 NOTE — Progress Notes (Addendum)
PCP - Dr. Kelton Pillar Cardiologist - Dr.Mark Marlou Porch  LOV- 06/28/2018 epic Nephrologist-Dr. Dorothea Ogle   LOV-11/16/2018 on chart Rheumatology-Dr. Bo Merino   LOV-televisit-02/06/2019  Chest x-ray - n/a EKG - 06/28/2018 epic Stress Test - 2015 ECHO - 05/09/2013 epic Cardiac Cath - n/a  Sleep Study - n/a CPAP - n/a  Fasting Blood Sugar - n/a Checks Blood Sugar _____ times a day 0 Blood Thinner Instructions:n/a Aspirin Instructions:n/a Last Dose:of Imuran was 03/30/2019 per Dr. Estanislado Pandy  Anesthesia review:  Chart given to Audria Nine to review medical history.  Patient has a history of polymyositis, CKD stage 3, Sjoegren syndrome and anemia.  Patient denies shortness of breath, fever, cough and chest pain at PAT appointment   Patient verbalized understanding of instructions that were given to them at the PAT appointment. Patient was also instructed that they will need to review over the PAT instructions again at home before surgery.

## 2019-04-06 MED ORDER — BUPIVACAINE LIPOSOME 1.3 % IJ SUSP
20.0000 mL | Freq: Once | INTRAMUSCULAR | Status: DC
  Filled 2019-04-06: qty 20

## 2019-04-07 ENCOUNTER — Other Ambulatory Visit: Payer: Self-pay

## 2019-04-07 ENCOUNTER — Encounter (HOSPITAL_COMMUNITY): Payer: Self-pay | Admitting: Specialist

## 2019-04-07 ENCOUNTER — Encounter (HOSPITAL_COMMUNITY)
Admission: RE | Disposition: A | Discharge status: Home / Self Care | Payer: Self-pay | Source: Ambulatory Visit | Attending: Specialist

## 2019-04-07 ENCOUNTER — Ambulatory Visit: Admit: 2019-04-07 | Payer: BC Managed Care – PPO | Admitting: Specialist

## 2019-04-07 ENCOUNTER — Ambulatory Visit (HOSPITAL_COMMUNITY): Payer: BC Managed Care – PPO | Admitting: Anesthesiology

## 2019-04-07 ENCOUNTER — Observation Stay (HOSPITAL_COMMUNITY)
Admission: RE | Admit: 2019-04-07 | Discharge: 2019-04-08 | Disposition: A | Discharge status: Home / Self Care | Payer: BC Managed Care – PPO | Source: Ambulatory Visit | Attending: Specialist | Admitting: Specialist

## 2019-04-07 ENCOUNTER — Ambulatory Visit (HOSPITAL_COMMUNITY): Payer: BC Managed Care – PPO | Admitting: Physician Assistant

## 2019-04-07 DIAGNOSIS — Z885 Allergy status to narcotic agent status: Secondary | ICD-10-CM | POA: Insufficient documentation

## 2019-04-07 DIAGNOSIS — Z7952 Long term (current) use of systemic steroids: Secondary | ICD-10-CM | POA: Diagnosis not present

## 2019-04-07 DIAGNOSIS — M1712 Unilateral primary osteoarthritis, left knee: Principal | ICD-10-CM | POA: Diagnosis present

## 2019-04-07 DIAGNOSIS — M3500 Sicca syndrome, unspecified: Secondary | ICD-10-CM | POA: Insufficient documentation

## 2019-04-07 DIAGNOSIS — E669 Obesity, unspecified: Secondary | ICD-10-CM | POA: Insufficient documentation

## 2019-04-07 DIAGNOSIS — Z6834 Body mass index (BMI) 34.0-34.9, adult: Secondary | ICD-10-CM | POA: Insufficient documentation

## 2019-04-07 DIAGNOSIS — Z79899 Other long term (current) drug therapy: Secondary | ICD-10-CM | POA: Diagnosis not present

## 2019-04-07 DIAGNOSIS — K219 Gastro-esophageal reflux disease without esophagitis: Secondary | ICD-10-CM | POA: Diagnosis not present

## 2019-04-07 DIAGNOSIS — N183 Chronic kidney disease, stage 3 unspecified: Secondary | ICD-10-CM | POA: Diagnosis not present

## 2019-04-07 HISTORY — PX: TOTAL KNEE ARTHROPLASTY: SHX125

## 2019-04-07 LAB — HIV ANTIBODY (ROUTINE TESTING W REFLEX): HIV Screen 4th Generation wRfx: NONREACTIVE

## 2019-04-07 LAB — CBC
HCT: 40.9 % (ref 36.0–46.0)
Hemoglobin: 12.7 g/dL (ref 12.0–15.0)
MCH: 28.5 pg (ref 26.0–34.0)
MCHC: 31.1 g/dL (ref 30.0–36.0)
MCV: 91.9 fL (ref 80.0–100.0)
Platelets: 171 10*3/uL (ref 150–400)
RBC: 4.45 MIL/uL (ref 3.87–5.11)
RDW: 13.2 % (ref 11.5–15.5)
WBC: 6.6 10*3/uL (ref 4.0–10.5)
nRBC: 0 % (ref 0.0–0.2)

## 2019-04-07 LAB — TYPE AND SCREEN
ABO/RH(D): B POS
Antibody Screen: NEGATIVE

## 2019-04-07 LAB — CREATININE, SERUM
Creatinine, Ser: 1.4 mg/dL — ABNORMAL HIGH (ref 0.44–1.00)
GFR calc Af Amer: 51 mL/min — ABNORMAL LOW (ref >60)
GFR calc non Af Amer: 44 mL/min — ABNORMAL LOW (ref >60)

## 2019-04-07 LAB — PREGNANCY, URINE: Preg Test, Ur: NEGATIVE

## 2019-04-07 SURGERY — ARTHROPLASTY, KNEE, TOTAL
Anesthesia: Spinal | Site: Knee | Laterality: Left

## 2019-04-07 MED ORDER — PANTOPRAZOLE SODIUM 40 MG PO TBEC
40.0000 mg | DELAYED_RELEASE_TABLET | Freq: Every day | ORAL | Status: DC
  Administered 2019-04-08: 40 mg via ORAL
  Filled 2019-04-07: qty 1

## 2019-04-07 MED ORDER — ONDANSETRON HCL 4 MG PO TABS: 4.0000 mg | ORAL_TABLET | Freq: Four times a day (QID) | ORAL | Status: DC | PRN

## 2019-04-07 MED ORDER — KETAMINE HCL 10 MG/ML IJ SOLN
INTRAMUSCULAR | Status: DC | PRN
  Administered 2019-04-07: 30 mg via INTRAVENOUS

## 2019-04-07 MED ORDER — IRRISEPT - 450ML BOTTLE WITH 0.05% CHG IN STERILE WATER, USP 99.95% OPTIME
TOPICAL | Status: DC | PRN
  Administered 2019-04-07: 350 mL

## 2019-04-07 MED ORDER — ACETAMINOPHEN 10 MG/ML IV SOLN: 1000.0000 mg | Freq: Once | INTRAVENOUS | Status: DC | PRN

## 2019-04-07 MED ORDER — OXYCODONE HCL 5 MG PO TABS
5.0000 mg | ORAL_TABLET | ORAL | Status: DC | PRN
  Administered 2019-04-07: 10 mg via ORAL
  Filled 2019-04-07: qty 2

## 2019-04-07 MED ORDER — POVIDONE-IODINE 10 % EX SWAB
2.0000 "application " | Freq: Once | CUTANEOUS | Status: AC
  Administered 2019-04-07: 2 via TOPICAL

## 2019-04-07 MED ORDER — SODIUM CHLORIDE (PF) 0.9 % IJ SOLN
INTRAMUSCULAR | Status: AC
  Filled 2019-04-07: qty 50

## 2019-04-07 MED ORDER — SENNOSIDES-DOCUSATE SODIUM 8.6-50 MG PO TABS: 1.0000 | ORAL_TABLET | Freq: Every evening | ORAL | Status: DC | PRN

## 2019-04-07 MED ORDER — TRANEXAMIC ACID-NACL 1000-0.7 MG/100ML-% IV SOLN
1000.0000 mg | INTRAVENOUS | Status: AC
  Administered 2019-04-07: 1000 mg via INTRAVENOUS
  Filled 2019-04-07: qty 100

## 2019-04-07 MED ORDER — DIPHENHYDRAMINE HCL 12.5 MG/5ML PO ELIX: 12.5000 mg | ORAL_SOLUTION | ORAL | Status: DC | PRN

## 2019-04-07 MED ORDER — MEPERIDINE HCL 50 MG/ML IJ SOLN: 6.2500 mg | INTRAMUSCULAR | Status: DC | PRN

## 2019-04-07 MED ORDER — TRAZODONE HCL 100 MG PO TABS
100.0000 mg | ORAL_TABLET | Freq: Every day | ORAL | Status: DC
  Administered 2019-04-07: 100 mg via ORAL
  Filled 2019-04-07: qty 1

## 2019-04-07 MED ORDER — SODIUM CHLORIDE 0.9 % IV SOLN: INTRAVENOUS | Status: DC

## 2019-04-07 MED ORDER — CLONAZEPAM 1 MG PO TABS
1.0000 mg | ORAL_TABLET | Freq: Every day | ORAL | Status: DC
  Administered 2019-04-07: 1 mg via ORAL
  Filled 2019-04-07: qty 1

## 2019-04-07 MED ORDER — BUPIVACAINE LIPOSOME 1.3 % IJ SUSP
INTRAMUSCULAR | Status: DC | PRN
  Administered 2019-04-07: 20 mL

## 2019-04-07 MED ORDER — FENTANYL CITRATE (PF) 100 MCG/2ML IJ SOLN
INTRAMUSCULAR | Status: AC
  Filled 2019-04-07: qty 2

## 2019-04-07 MED ORDER — ACETAMINOPHEN 325 MG PO TABS: 325.0000 mg | ORAL_TABLET | Freq: Once | ORAL | Status: DC | PRN

## 2019-04-07 MED ORDER — STERILE WATER FOR IRRIGATION IR SOLN
Status: DC | PRN
  Administered 2019-04-07: 2000 mL

## 2019-04-07 MED ORDER — FENTANYL CITRATE (PF) 100 MCG/2ML IJ SOLN
INTRAMUSCULAR | Status: DC | PRN
  Administered 2019-04-07: 50 ug via INTRAVENOUS
  Administered 2019-04-07: 25 ug via INTRAVENOUS

## 2019-04-07 MED ORDER — TRAMADOL HCL 50 MG PO TABS: 50.0000 mg | ORAL_TABLET | Freq: Four times a day (QID) | ORAL | 0 refills | Status: DC | PRN

## 2019-04-07 MED ORDER — HYDROMORPHONE HCL 1 MG/ML IJ SOLN
0.5000 mg | INTRAMUSCULAR | Status: DC | PRN
  Administered 2019-04-07: 1 mg via INTRAVENOUS
  Filled 2019-04-07: qty 1

## 2019-04-07 MED ORDER — SODIUM CHLORIDE 0.9 % IR SOLN
Status: DC | PRN
  Administered 2019-04-07: 1000 mL

## 2019-04-07 MED ORDER — METHOCARBAMOL 500 MG PO TABS: 500.0000 mg | ORAL_TABLET | Freq: Four times a day (QID) | ORAL | 0 refills | Status: DC

## 2019-04-07 MED ORDER — HYDROMORPHONE HCL 2 MG PO TABS: 2.0000 mg | ORAL_TABLET | ORAL | 0 refills | Status: AC | PRN

## 2019-04-07 MED ORDER — SODIUM CHLORIDE (PF) 0.9 % IJ SOLN
INTRAMUSCULAR | Status: DC | PRN
  Administered 2019-04-07: 60 mL

## 2019-04-07 MED ORDER — ACETAMINOPHEN 325 MG PO TABS: 325.0000 mg | ORAL_TABLET | Freq: Four times a day (QID) | ORAL | Status: DC | PRN

## 2019-04-07 MED ORDER — LIDOCAINE HCL (CARDIAC) PF 100 MG/5ML IV SOSY
PREFILLED_SYRINGE | INTRAVENOUS | Status: DC | PRN
  Administered 2019-04-07: 40 mg via INTRAVENOUS

## 2019-04-07 MED ORDER — BUPIVACAINE IN DEXTROSE 0.75-8.25 % IT SOLN
INTRATHECAL | Status: DC | PRN
  Administered 2019-04-07: 2 mL via INTRATHECAL

## 2019-04-07 MED ORDER — MIDAZOLAM HCL 2 MG/2ML IJ SOLN
INTRAMUSCULAR | Status: AC
  Filled 2019-04-07: qty 2

## 2019-04-07 MED ORDER — METHOCARBAMOL 500 MG PO TABS
500.0000 mg | ORAL_TABLET | Freq: Four times a day (QID) | ORAL | Status: DC | PRN
  Administered 2019-04-07 – 2019-04-08 (×2): 500 mg via ORAL
  Filled 2019-04-07 (×2): qty 1

## 2019-04-07 MED ORDER — ESTRADIOL-NORETHINDRONE ACET 1-0.5 MG PO TABS: 1.0000 | ORAL_TABLET | Freq: Every day | ORAL | Status: DC

## 2019-04-07 MED ORDER — CEFAZOLIN SODIUM-DEXTROSE 1-4 GM/50ML-% IV SOLN
1.0000 g | Freq: Three times a day (TID) | INTRAVENOUS | Status: AC
  Administered 2019-04-07 – 2019-04-08 (×3): 1 g via INTRAVENOUS
  Filled 2019-04-07 (×4): qty 50

## 2019-04-07 MED ORDER — 0.9 % SODIUM CHLORIDE (POUR BTL) OPTIME
TOPICAL | Status: DC | PRN
  Administered 2019-04-07: 1000 mL

## 2019-04-07 MED ORDER — GABAPENTIN 300 MG PO CAPS
300.0000 mg | ORAL_CAPSULE | Freq: Three times a day (TID) | ORAL | Status: DC
  Administered 2019-04-07 – 2019-04-08 (×3): 300 mg via ORAL
  Filled 2019-04-07 (×3): qty 1

## 2019-04-07 MED ORDER — LACTATED RINGERS IV SOLN: INTRAVENOUS | Status: DC

## 2019-04-07 MED ORDER — ONDANSETRON HCL 4 MG/2ML IJ SOLN
INTRAMUSCULAR | Status: AC
  Filled 2019-04-07: qty 2

## 2019-04-07 MED ORDER — BISACODYL 5 MG PO TBEC: 5.0000 mg | DELAYED_RELEASE_TABLET | Freq: Every day | ORAL | Status: DC | PRN

## 2019-04-07 MED ORDER — ACETAMINOPHEN 500 MG PO TABS
1000.0000 mg | ORAL_TABLET | Freq: Four times a day (QID) | ORAL | Status: AC
  Administered 2019-04-07 – 2019-04-08 (×4): 1000 mg via ORAL
  Filled 2019-04-07 (×4): qty 2

## 2019-04-07 MED ORDER — MAGNESIUM CITRATE PO SOLN: 1.0000 | Freq: Once | ORAL | Status: DC | PRN

## 2019-04-07 MED ORDER — MIDAZOLAM HCL 5 MG/5ML IJ SOLN
INTRAMUSCULAR | Status: DC | PRN
  Administered 2019-04-07 (×2): 1 mg via INTRAVENOUS

## 2019-04-07 MED ORDER — CEFAZOLIN SODIUM-DEXTROSE 2-4 GM/100ML-% IV SOLN
2.0000 g | INTRAVENOUS | Status: AC
  Administered 2019-04-07: 2 g via INTRAVENOUS
  Filled 2019-04-07: qty 100

## 2019-04-07 MED ORDER — ONDANSETRON HCL 4 MG/2ML IJ SOLN: 4.0000 mg | Freq: Four times a day (QID) | INTRAMUSCULAR | Status: DC | PRN

## 2019-04-07 MED ORDER — SODIUM CHLORIDE (PF) 0.9 % IJ SOLN
INTRAMUSCULAR | Status: AC
  Filled 2019-04-07: qty 10

## 2019-04-07 MED ORDER — DEXAMETHASONE SODIUM PHOSPHATE 10 MG/ML IJ SOLN
INTRAMUSCULAR | Status: AC
  Filled 2019-04-07: qty 1

## 2019-04-07 MED ORDER — ONDANSETRON HCL 4 MG/2ML IJ SOLN
INTRAMUSCULAR | Status: DC | PRN
  Administered 2019-04-07: 4 mg via INTRAVENOUS

## 2019-04-07 MED ORDER — METHOCARBAMOL 500 MG IVPB - SIMPLE MED
500.0000 mg | Freq: Four times a day (QID) | INTRAVENOUS | Status: DC | PRN
  Filled 2019-04-07: qty 50

## 2019-04-07 MED ORDER — PROPOFOL 500 MG/50ML IV EMUL
INTRAVENOUS | Status: AC
  Filled 2019-04-07: qty 50

## 2019-04-07 MED ORDER — KETAMINE HCL 10 MG/ML IJ SOLN
INTRAMUSCULAR | Status: AC
  Filled 2019-04-07: qty 1

## 2019-04-07 MED ORDER — LIDOCAINE 2% (20 MG/ML) 5 ML SYRINGE
INTRAMUSCULAR | Status: AC
  Filled 2019-04-07: qty 5

## 2019-04-07 MED ORDER — ENOXAPARIN SODIUM 30 MG/0.3ML ~~LOC~~ SOLN
30.0000 mg | Freq: Two times a day (BID) | SUBCUTANEOUS | Status: DC
  Administered 2019-04-08: 30 mg via SUBCUTANEOUS
  Filled 2019-04-07: qty 0.3

## 2019-04-07 MED ORDER — OXYCODONE HCL 5 MG PO TABS
10.0000 mg | ORAL_TABLET | ORAL | Status: DC | PRN
  Administered 2019-04-08: 10 mg via ORAL
  Filled 2019-04-07: qty 2

## 2019-04-07 MED ORDER — PREDNISONE 1 MG PO TABS
2.0000 mg | ORAL_TABLET | Freq: Every day | ORAL | Status: DC
  Administered 2019-04-08: 2 mg via ORAL
  Filled 2019-04-07: qty 2

## 2019-04-07 MED ORDER — GABAPENTIN 300 MG PO CAPS: 300.0000 mg | ORAL_CAPSULE | Freq: Every day | ORAL | Status: DC

## 2019-04-07 MED ORDER — DEXAMETHASONE SODIUM PHOSPHATE 10 MG/ML IJ SOLN
8.0000 mg | Freq: Once | INTRAMUSCULAR | Status: AC
  Administered 2019-04-07: 4 mg via INTRAVENOUS

## 2019-04-07 MED ORDER — HYDROMORPHONE HCL 1 MG/ML IJ SOLN: 0.2500 mg | INTRAMUSCULAR | Status: DC | PRN

## 2019-04-07 MED ORDER — PREDNISONE 5 MG PO TABS
5.0000 mg | ORAL_TABLET | Freq: Every day | ORAL | Status: DC
  Administered 2019-04-08: 5 mg via ORAL
  Filled 2019-04-07: qty 1

## 2019-04-07 MED ORDER — DULOXETINE HCL 60 MG PO CPEP
60.0000 mg | ORAL_CAPSULE | Freq: Every day | ORAL | Status: DC
  Administered 2019-04-08: 60 mg via ORAL
  Filled 2019-04-07: qty 1

## 2019-04-07 MED ORDER — TRAMADOL HCL 50 MG PO TABS
50.0000 mg | ORAL_TABLET | Freq: Four times a day (QID) | ORAL | Status: DC
  Administered 2019-04-07 – 2019-04-08 (×4): 50 mg via ORAL
  Filled 2019-04-07 (×4): qty 1

## 2019-04-07 MED ORDER — PROPOFOL 500 MG/50ML IV EMUL
INTRAVENOUS | Status: DC | PRN
  Administered 2019-04-07: 50 ug/kg/min via INTRAVENOUS

## 2019-04-07 MED ORDER — ACETAMINOPHEN 160 MG/5ML PO SOLN: 325.0000 mg | Freq: Once | ORAL | Status: DC | PRN

## 2019-04-07 MED ORDER — CHLORHEXIDINE GLUCONATE 4 % EX LIQD: 60.0000 mL | Freq: Once | CUTANEOUS | Status: DC

## 2019-04-07 MED ORDER — ONDANSETRON HCL 4 MG PO TABS: 4.0000 mg | ORAL_TABLET | Freq: Every day | ORAL | 1 refills | Status: DC | PRN

## 2019-04-07 MED ORDER — PROMETHAZINE HCL 25 MG/ML IJ SOLN: 6.2500 mg | INTRAMUSCULAR | Status: DC | PRN

## 2019-04-07 MED ORDER — ROPIVACAINE HCL 7.5 MG/ML IJ SOLN
INTRAMUSCULAR | Status: DC | PRN
  Administered 2019-04-07: 20 mL via PERINEURAL

## 2019-04-07 SURGICAL SUPPLY — 69 items
ADH SKN CLS APL DERMABOND .7 (GAUZE/BANDAGES/DRESSINGS) ×1
ATTUNE PS FEM LT SZ 7 CEM KNEE (Femur) ×2 IMPLANT
ATTUNE PSRP INSR SZ7 7 KNEE (Insert) ×1 IMPLANT
BAG DECANTER FOR FLEXI CONT (MISCELLANEOUS) IMPLANT
BAG SPEC THK2 15X12 ZIP CLS (MISCELLANEOUS) ×2
BAG ZIPLOCK 12X15 (MISCELLANEOUS) ×4 IMPLANT
BASE TIBIAL ROT PLAT SZ 7 KNEE (Knees) IMPLANT
BLADE SAG 18X100X1.27 (BLADE) ×2 IMPLANT
BLADE SAW SGTL 11.0X1.19X90.0M (BLADE) ×2 IMPLANT
BNDG ELASTIC 4X5.8 VLCR STR LF (GAUZE/BANDAGES/DRESSINGS) ×2 IMPLANT
BNDG ELASTIC 6X5.8 VLCR STR LF (GAUZE/BANDAGES/DRESSINGS) ×2 IMPLANT
BOWL SMART MIX CTS (DISPOSABLE) ×2 IMPLANT
BSPLAT TIB 7 CMNT ROT PLAT STR (Knees) ×1 IMPLANT
CEMENT HV SMART SET (Cement) ×2 IMPLANT
COVER SURGICAL LIGHT HANDLE (MISCELLANEOUS) ×2 IMPLANT
COVER WAND RF STERILE (DRAPES) ×1 IMPLANT
CUFF TOURN SGL QUICK 34 (TOURNIQUET CUFF) ×2
CUFF TRNQT CYL 34X4.125X (TOURNIQUET CUFF) ×1 IMPLANT
DECANTER SPIKE VIAL GLASS SM (MISCELLANEOUS) ×3 IMPLANT
DERMABOND ADVANCED (GAUZE/BANDAGES/DRESSINGS) ×1
DERMABOND ADVANCED .7 DNX12 (GAUZE/BANDAGES/DRESSINGS) ×1 IMPLANT
DRAPE U-SHAPE 47X51 STRL (DRAPES) ×2 IMPLANT
DRSG AQUACEL AG ADV 3.5X10 (GAUZE/BANDAGES/DRESSINGS) ×2 IMPLANT
DRSG TEGADERM 4X4.75 (GAUZE/BANDAGES/DRESSINGS) ×2 IMPLANT
DURAPREP 26ML APPLICATOR (WOUND CARE) ×4 IMPLANT
ELECT REM PT RETURN 15FT ADLT (MISCELLANEOUS) ×2 IMPLANT
EVACUATOR 1/8 PVC DRAIN (DRAIN) ×2 IMPLANT
GAUZE SPONGE 2X2 8PLY STRL LF (GAUZE/BANDAGES/DRESSINGS) ×1 IMPLANT
GLOVE BIOGEL PI IND STRL 7.5 (GLOVE) ×1 IMPLANT
GLOVE BIOGEL PI IND STRL 8 (GLOVE) ×1 IMPLANT
GLOVE BIOGEL PI INDICATOR 7.5 (GLOVE) ×1
GLOVE BIOGEL PI INDICATOR 8 (GLOVE) ×1
GLOVE ECLIPSE 8.0 STRL XLNG CF (GLOVE) ×2 IMPLANT
GLOVE SURG ORTHO 9.0 STRL STRW (GLOVE) ×2 IMPLANT
GLOVE SURG SS PI 7.0 STRL IVOR (GLOVE) ×2 IMPLANT
GOWN STRL REIN 3XL XLG LVL4 (GOWN DISPOSABLE) ×1 IMPLANT
GOWN STRL REUS W/TWL XL LVL3 (GOWN DISPOSABLE) ×4 IMPLANT
HANDPIECE INTERPULSE COAX TIP (DISPOSABLE) ×2
HOLDER FOLEY CATH W/STRAP (MISCELLANEOUS) ×2 IMPLANT
JET LAVAGE IRRISEPT WOUND (IRRIGATION / IRRIGATOR) ×2
KIT TURNOVER KIT A (KITS) IMPLANT
LAVAGE JET IRRISEPT WOUND (IRRIGATION / IRRIGATOR) ×1 IMPLANT
NS IRRIG 1000ML POUR BTL (IV SOLUTION) ×2 IMPLANT
PACK TOTAL KNEE CUSTOM (KITS) ×2 IMPLANT
PATELLA MEDIAL ATTUN 35MM KNEE (Knees) ×1 IMPLANT
PENCIL SMOKE EVACUATOR (MISCELLANEOUS) ×1 IMPLANT
PIN STEINMAN FIXATION KNEE (PIN) ×1 IMPLANT
PROTECTOR NERVE ULNAR (MISCELLANEOUS) ×2 IMPLANT
SET HNDPC FAN SPRY TIP SCT (DISPOSABLE) ×1 IMPLANT
SET PAD KNEE POSITIONER (MISCELLANEOUS) ×2 IMPLANT
SPONGE GAUZE 2X2 STER 10/PKG (GAUZE/BANDAGES/DRESSINGS) ×1
SPONGE LAP 18X18 RF (DISPOSABLE) IMPLANT
SPONGE SURGIFOAM ABS GEL 100 (HEMOSTASIS) ×2 IMPLANT
STOCKINETTE 6  STRL (DRAPES) ×1
STOCKINETTE 6 STRL (DRAPES) ×1 IMPLANT
SUT BONE WAX W31G (SUTURE) IMPLANT
SUT MNCRL AB 3-0 PS2 18 (SUTURE) ×2 IMPLANT
SUT VIC AB 1 CT1 27 (SUTURE) ×6
SUT VIC AB 1 CT1 27XBRD ANTBC (SUTURE) ×3 IMPLANT
SUT VIC AB 2-0 CT1 27 (SUTURE) ×4
SUT VIC AB 2-0 CT1 TAPERPNT 27 (SUTURE) ×2 IMPLANT
SUT VLOC 180 0 24IN GS25 (SUTURE) ×2 IMPLANT
SYR 50ML LL SCALE MARK (SYRINGE) IMPLANT
TAPE STRIPS DRAPE STRL (GAUZE/BANDAGES/DRESSINGS) ×2 IMPLANT
TIBIAL BASE ROT PLAT SZ 7 KNEE (Knees) ×2 IMPLANT
TRAY FOLEY MTR SLVR 14FR STAT (SET/KITS/TRAYS/PACK) ×1 IMPLANT
WATER STERILE IRR 1000ML POUR (IV SOLUTION) ×4 IMPLANT
WRAP KNEE MAXI GEL POST OP (GAUZE/BANDAGES/DRESSINGS) ×2 IMPLANT
YANKAUER SUCT BULB TIP 10FT TU (MISCELLANEOUS) ×2 IMPLANT

## 2019-04-07 NOTE — Anesthesia Postprocedure Evaluation (Signed)
Anesthesia Post Note  Patient: Connie Brady  Procedure(s) Performed: TOTAL KNEE ARTHROPLASTY (Left Knee)     Patient location during evaluation: PACU Anesthesia Type: Spinal Level of consciousness: oriented and awake and alert Pain management: pain level controlled Vital Signs Assessment: post-procedure vital signs reviewed and stable Respiratory status: spontaneous breathing, respiratory function stable and patient connected to nasal cannula oxygen Cardiovascular status: blood pressure returned to baseline and stable Postop Assessment: no headache, no backache, no apparent nausea or vomiting and spinal receding Anesthetic complications: no    Last Vitals:  Vitals:   04/07/19 1130 04/07/19 1150  BP: 135/78 124/69  Pulse: (!) 59 61  Resp: 20   Temp: (!) 36.4 C (!) 36.3 C  SpO2: 100% 100%    Last Pain:  Vitals:   04/07/19 1150  TempSrc: Oral  PainSc:                  Effie Berkshire

## 2019-04-07 NOTE — Interval H&P Note (Signed)
History and Physical Interval Note:  04/07/2019 7:36 AM  Connie Brady  has presented today for surgery, with the diagnosis of Left knee osteoarthritis.  The various methods of treatment have been discussed with the patient and family. After consideration of risks, benefits and other options for treatment, the patient has consented to  Procedure(s) with comments: TOTAL KNEE ARTHROPLASTY (Left) - adductor canal as a surgical intervention.  The patient's history has been reviewed, patient examined, no change in status, stable for surgery.  I have reviewed the patient's chart and labs.  Questions were answered to the patient's satisfaction.     [name] ANDREW

## 2019-04-07 NOTE — Op Note (Signed)
DATE OF SURGERY:  04/07/2019  TIME: 9:50 AM  PATIENT NAME:  Connie Brady    AGE: 49 y.o.   PRE-OPERATIVE DIAGNOSIS:  Left knee osteoarthritis  POST-OPERATIVE DIAGNOSIS:  Left knee osteoarthritis  PROCEDURE:  Procedure(s): TOTAL KNEE ARTHROPLASTY  SURGEON:  Almedia Cordell ANDREW  ASSISTANT:  Leeanne Haus, PA-C, present and scrubbed throughout the case, critical for assistance with exposure, retraction, instrumentation, and closure.  OPERATIVE IMPLANTS: Depuy PFC Attune Rotating Platform.  Femur size 7, Tibia size 7, Patella size 35 3-peg oval button, with a 7 mm polyethylene insert.   PREOPERATIVE INDICATIONS:   Connie Brady is a 49 y.o. year old female with end stage bone on bone arthritis of the knee who failed conservative treatment and elected for Total Knee Arthroplasty.   The risks, benefits, and alternatives were discussed at length including but not limited to the risks of infection, bleeding, nerve injury, stiffness, blood clots, the need for revision surgery, cardiopulmonary complications, among others, and they were willing to proceed.  OPERATIVE DESCRIPTION:  The patient was brought to the operative room and placed in a supine position.  Spinal anesthesia was administered.  IV antibiotics were given.  The lower extremity was prepped and draped in the usual sterile fashion.  Time out was performed.  The leg was elevated and exsanguinated and the tourniquet was inflated.  Anterior quadriceps tendon splitting approach was performed.  The patella was retracted and osteophytes were removed.  The anterior horn of the medial and lateral meniscus was removed and cruciate ligaments resected.   The distal femur was opened with the drill and the intramedullary distal femoral cutting jig was utilized, set at 5 degrees resecting 10 mm off the distal femur.  Care was taken to protect the collateral ligaments.  The distal femoral sizing jig was applied, taking care to avoid  notching.  Then the 4-in-1 cutting jig was applied and the anterior and posterior femur was cut, along with the chamfer cuts.    Then the extramedullary tibial cutting jig was utilized making the appropriate cut using the anterior tibial crest as a reference building in appropriate posterior slope.  Care was taken during the cut to protect the medial and collateral ligaments.  The proximal tibia was removed along with the posterior horns of the menisci.   The posterior medial femoral osteophytes and posterior lateral femoral osteophytes were removed.    The flexion gap was then measured and was symmetric with the extension gap, measured at 7.  I completed the distal femoral preparation using the appropriate jig to prepare the box.  The patella was then measured, and cut with the saw.    The proximal tibia sized and prepared accordingly with the reamer and the punch, and then all components were trialed with the trial insert.  The knee was found to have excellent balance and full motion.    The above named components were then cemented into place and all excess cement was removed.  The trial polyethylene component was in place during cementation, and then was exchanged for the real polyethylene component.    The knee was easily taken through a range of motion and the patella tracked well and the knee irrigated copiously and the parapatellar and subcutaneous tissue closed with vicryl, and monocryl with steri strips for the skin.  The arthrotomy was closed at 90 of flexion. The wounds were dressed with sterile gauze and the tourniquet released and the patient was awakened and returned to the PACU in  stable and satisfactory condition.  There were no complications.  Total tourniquet time was 85 minutes.

## 2019-04-07 NOTE — Anesthesia Procedure Notes (Signed)
Anesthesia Regional Block: Adductor canal block   Pre-Anesthetic Checklist: ,, timeout performed, Correct Patient, Correct Site, Correct Laterality, Correct Procedure, Correct Position, site marked, Risks and benefits discussed,  Surgical consent,  Pre-op evaluation,  At surgeon's request and post-op pain management  Laterality: Left  Prep: chloraprep       Needles:  Injection technique: Single-shot  Needle Type: Echogenic Stimulator Needle     Needle Length: 9cm  Needle Gauge: 21     Additional Needles:   Procedures:,,,, ultrasound used (permanent image in chart),,,,  Narrative:  Start time: 04/07/2019 7:05 AM End time: 04/07/2019 7:15 AM Injection made incrementally with aspirations every 5 mL.  Performed by: Personally  Anesthesiologist: Effie Berkshire, MD  Additional Notes: Patient tolerated the procedure well. Local anesthetic introduced in an incremental fashion under minimal resistance after negative aspirations. No paresthesias were elicited. After completion of the procedure, no acute issues were identified and patient continued to be monitored by RN.

## 2019-04-07 NOTE — Anesthesia Preprocedure Evaluation (Addendum)
Anesthesia Evaluation  Patient identified by MRN, date of birth, ID band Patient awake    Reviewed: Allergy & Precautions, NPO status , Patient's Chart, lab work & pertinent test results  Airway Mallampati: II  TM Distance: >3 FB Neck ROM: Full    Dental  (+) Teeth Intact, Dental Advisory Given   Pulmonary    breath sounds clear to auscultation       Cardiovascular negative cardio ROS   Rhythm:Regular Rate:Normal     Neuro/Psych  Neuromuscular disease negative psych ROS   GI/Hepatic Neg liver ROS, GERD  ,  Endo/Other  negative endocrine ROS  Renal/GU Renal InsufficiencyRenal disease     Musculoskeletal  (+) Arthritis ,   Abdominal Normal abdominal exam  (+)   Peds  Hematology negative hematology ROS (+)   Anesthesia Other Findings   Reproductive/Obstetrics                            Anesthesia Physical Anesthesia Plan  ASA: II  Anesthesia Plan: Spinal   Post-op Pain Management:    Induction:   PONV Risk Score and Plan: 3 and Ondansetron, Propofol infusion and Midazolam  Airway Management Planned: Natural Airway and Simple Face Mask  Additional Equipment: None  Intra-op Plan:   Post-operative Plan:   Informed Consent: I have reviewed the patients History and Physical, chart, labs and discussed the procedure including the risks, benefits and alternatives for the proposed anesthesia with the patient or authorized representative who has indicated his/her understanding and acceptance.       Plan Discussed with: CRNA  Anesthesia Plan Comments: (Lab Results      Component                Value               Date                      WBC                      6.7                 04/04/2019                HGB                      12.9                04/04/2019                HCT                      40.5                04/04/2019                MCV                      89.0                 04/04/2019                PLT                      221                 04/04/2019  Covid-19 Nucleic Acid Test Results Lab Results      Component                Value               Date                      [id number]              NEGATIVE            04/04/2019           )       Anesthesia Quick Evaluation

## 2019-04-07 NOTE — Transfer of Care (Signed)
Immediate Anesthesia Transfer of Care Note  Patient: Connie Brady  Procedure(s) Performed: TOTAL KNEE ARTHROPLASTY (Left Knee)  Patient Location: PACU  Anesthesia Type:Spinal  Level of Consciousness: awake and alert   Airway & Oxygen Therapy: Patient Spontanous Breathing and Patient connected to face mask oxygen  Post-op Assessment: Report given to RN and Post -op Vital signs reviewed and stable  Post vital signs: Reviewed and stable  Last Vitals:  Vitals Value Taken Time  BP 117/79 04/07/19 1007  Temp    Pulse 73 04/07/19 1009  Resp 20 04/07/19 1009  SpO2 100 % 04/07/19 1009  Vitals shown include unvalidated device data.  Last Pain:  Vitals:   04/07/19 0559  TempSrc: Oral         Complications: No apparent anesthesia complications

## 2019-04-07 NOTE — Evaluation (Signed)
Physical Therapy Evaluation Patient Details Name: Connie Brady MRN: [id number] DOB: 05/16/1970 Today's Date: 04/07/2019   History of Present Illness  L TKA; PMH polymyositis, Sjoegrens Syndrome  Clinical Impression  Pt is s/p TKA resulting in the deficits listed below (see PT Problem List). Pt ambulated 27' with RW, no loss of balance. Initiated TKA HEP. Good progress expected.  Pt will benefit from skilled PT to increase their independence and safety with mobility to allow discharge to the venue listed below.      Follow Up Recommendations Follow surgeon's recommendation for DC plan and follow-up therapies;Outpatient PT(pt has OPPT appointment at [facility] 04/10/19)    Equipment Recommendations  3in1 (PT)    Recommendations for Other Services       Precautions / Restrictions Precautions Precautions: Knee Precaution Booklet Issued: Yes (comment) Precaution Comments: reviewed no pillow under knee Restrictions Weight Bearing Restrictions: No Other Position/Activity Restrictions: WBAT      Mobility  Bed Mobility Overal bed mobility: Modified Independent             General bed mobility comments: HOB up, used rail  Transfers Overall transfer level: Needs assistance Equipment used: Rolling walker (2 wheeled) Transfers: Sit to/from Stand Sit to Stand: Min assist;From elevated surface         General transfer comment: VCs hand placement, min A to power up  Ambulation/Gait Ambulation/Gait assistance: Min guard Gait Distance (Feet): 50 Feet Assistive device: Rolling walker (2 wheeled) Gait Pattern/deviations: Step-to pattern;Decreased stride length;Trunk flexed     General Gait Details: VCs sequencing and posture, no loss of balance  Stairs            Wheelchair Mobility    Modified Rankin (Stroke Patients Only)       Balance Overall balance assessment: Modified Independent                                            Pertinent Vitals/Pain Pain Assessment: 0-10 Pain Score: 4  Pain Location: L knee Pain Descriptors / Indicators: Sore Pain Intervention(s): Limited activity within patient's tolerance;Monitored during session;Premedicated before session;Patient requesting pain meds-RN notified;Ice applied    Home Living Family/patient expects to be discharged to:: Private residence Living Arrangements: Children;Other (Comment)(2 brothers) Available Help at Discharge: Family;Available 24 hours/day   Home Access: Stairs to enter Entrance Stairs-Rails: Can reach both;Left;Right Entrance Stairs-Number of Steps: 3 Home Layout: One level Home Equipment: Walker - 2 wheels      Prior Function Level of Independence: Independent               Hand Dominance        Extremity/Trunk Assessment   Upper Extremity Assessment Upper Extremity Assessment: Overall WFL for tasks assessed    Lower Extremity Assessment Lower Extremity Assessment: LLE deficits/detail LLE Deficits / Details: SLR 3/5, knee ext at least 3/5 LLE Sensation: WNL LLE Coordination: WNL    Cervical / Trunk Assessment Cervical / Trunk Assessment: Normal  Communication   Communication: No difficulties  Cognition Arousal/Alertness: Awake/alert Behavior During Therapy: WFL for tasks assessed/performed Overall Cognitive Status: Within Functional Limits for tasks assessed                                        General Comments  Exercises Total Joint Exercises Ankle Circles/Pumps: AROM;Both;10 reps;Supine Quad Sets: AROM;Both;5 reps;Supine Heel Slides: AAROM;Left;10 reps;Supine Long Arc Quad: AROM;Left;5 reps;Seated   Assessment/Plan    PT Assessment Patient needs continued PT services  PT Problem List Decreased strength;Decreased range of motion;Decreased activity tolerance;Decreased balance;Decreased mobility       PT Treatment Interventions DME instruction;Gait training;Stair  training;Therapeutic exercise;Therapeutic activities;Patient/family education    PT Goals (Current goals can be found in the Care Plan section)  Acute Rehab PT Goals 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 PT Goal Formulation: With patient Time For Goal Achievement: 04/14/19 Potential to Achieve Goals: Good    Frequency 7X/week   Barriers to discharge        Co-evaluation               AM-PAC PT "6 Clicks" Mobility  Outcome Measure Help needed turning from your back to your side while in a flat bed without using bedrails?: A Little Help needed moving from lying on your back to sitting on the side of a flat bed without using bedrails?: A Little Help needed moving to and from a bed to a chair (including a wheelchair)?: A Little Help needed standing up from a chair using your arms (e.g., wheelchair or bedside chair)?: A Little Help needed to walk in hospital room?: A Little Help needed climbing 3-5 steps with a railing? : A Lot 6 Click Score: 17    End of Session Equipment Utilized During Treatment: Gait belt Activity Tolerance: Patient tolerated treatment well Patient left: in chair;with call bell/phone within reach;with chair alarm set Nurse Communication: Mobility status PT Visit Diagnosis: Pain;Muscle weakness (generalized) (M62.81);Difficulty in walking, not elsewhere classified (R26.2) Pain - Right/Left: Left Pain - part of body: Knee    Time: [id number] PT Time Calculation (min) (ACUTE ONLY): 24 min   Charges:   PT Evaluation $PT Eval Low Complexity: 1 Low PT Treatments $Gait Training: 8-22 mins       Blondell Reveal Kistler PT 04/07/2019  Acute Rehabilitation Services Pager [phone] Office [phone]

## 2019-04-07 NOTE — Anesthesia Procedure Notes (Signed)
Spinal  Start time: 04/07/2019 7:51 AM End time: 04/07/2019 7:53 AM Staffing Performed: anesthesiologist  Anesthesiologist: Effie Berkshire, MD Preanesthetic Checklist Completed: patient identified, IV checked, site marked, risks and benefits discussed, surgical consent, monitors and equipment checked, pre-op evaluation and timeout performed Spinal Block Patient position: sitting Prep: DuraPrep and site prepped and draped Location: L3-4 Injection technique: single-shot Needle Needle type: Pencan  Needle gauge: 24 G Needle length: 10 cm Needle insertion depth: 10 cm Additional Notes Patient tolerated well. No immediate complications.

## 2019-04-07 NOTE — Interval H&P Note (Signed)
History and Physical Interval Note:  04/07/2019 7:37 AM  Connie Brady  has presented today for surgery, with the diagnosis of Left knee osteoarthritis.  The various methods of treatment have been discussed with the patient and family. After consideration of risks, benefits and other options for treatment, the patient has consented to  Procedure(s) with comments: TOTAL KNEE ARTHROPLASTY (Left) - adductor canal as a surgical intervention.  The patient's history has been reviewed, patient examined, no change in status, stable for surgery.  I have reviewed the patient's chart and labs.  Questions were answered to the patient's satisfaction.     Kimberlea Schlag ANDREW

## 2019-04-08 DIAGNOSIS — M1712 Unilateral primary osteoarthritis, left knee: Secondary | ICD-10-CM | POA: Diagnosis not present

## 2019-04-08 NOTE — Progress Notes (Signed)
Subjective: 1 Day Post-Op Procedure(s) (LRB): TOTAL KNEE ARTHROPLASTY (Left) Patient reports pain as moderate.   No events over night. +ambulation with PT yesterday Tolerating PO without N/V +flatus Due to void   Objective: Vital signs in last 24 hours: Temp:  [97.4 F (36.3 C)-98.1 F (36.7 C)] 97.5 F (36.4 C) (02/20 0559) Pulse Rate:  [59-94] 69 (02/20 0559) Resp:  [15-24] 16 (02/20 0559) BP: (101-135)/(69-85) 108/71 (02/20 0559) SpO2:  [95 %-100 %] 100 % (02/20 0559)  Intake/Output from previous day: 02/19 0701 - 02/20 0700 In: [id number] [P.O.:240; I.V.:1400; IV Piggyback:50] Out: 2500 [Urine:2000; Drains:470; Blood:30] Intake/Output this shift: No intake/output data recorded.  Recent Labs    04/07/19 1041  HGB 12.7   Recent Labs    04/07/19 1041  WBC 6.6  RBC 4.45  HCT 40.9  PLT 171   Recent Labs    04/07/19 1041  CREATININE 1.40*   No results for input(s): LABPT, INR in the last 72 hours.  Neurologically intact ABD soft Neurovascular intact Sensation intact distally Intact pulses distally Dorsiflexion/Plantar flexion intact Incision: dressing C/D/I No cellulitis present Compartment soft drain with minimal serousaguenous drainage was pulled without difficults.  Calve's soft, non-tender, no palpable cords, Homan's negative bilaterally.    Assessment/Plan: 1 Day Post-Op Procedure(s) (LRB): TOTAL KNEE ARTHROPLASTY (Left) Advance diet Up with therapy  Encourage IS DVT PPx: Teds, SCDs, ambulation, on lovenox as IP 3 in 1 ordered.  Plan discharge today after void and PT    Connie Brady Connie Brady 04/08/2019, 9:04 AM

## 2019-04-08 NOTE — Progress Notes (Signed)
Physical Therapy Treatment Patient Details Name: Connie Brady MRN: [id number] DOB: June 12, 1970 Today's Date: 04/08/2019    History of Present Illness L TKA; PMH polymyositis, Sjoegrens Syndrome    PT Comments    Patient received in bed, very pleasant and motivated to work with PT today. See below for mobility/assist levels. L knee ROM in sitting estimated to be 20 degrees extension, 75 degrees flexion. Able to progress gait distance significantly and introduced stair training, which she was able to perform easily with B railings and min guard. Reviewed and discussed TKR exercises, also exercise frequency/timing at home, appropriate time frame for ice application, and what to expect in OP PT for her knee. She did ask about intermittent fasting- encouraged her to remain on regular dietary routine while she is recovering from surgery to assist in facilitating optimal recovery and to follow up with RD for intermittent fasting plans after recovering from surgery.  She was left up in the chair with all needs met, chair alarm active, RN aware of patient status. She did great with PT- no need for second PT session prior to DC.   Follow Up Recommendations  Follow surgeon's recommendation for DC plan and follow-up therapies;Outpatient PT([facility] 04/10/19)     Equipment Recommendations  3in1 (PT)    Recommendations for Other Services       Precautions / Restrictions Precautions Precautions: Knee Precaution Booklet Issued: Yes (comment) Precaution Comments: reviewed no pillow under knee Restrictions Weight Bearing Restrictions: Yes LLE Weight Bearing: Weight bearing as tolerated Other Position/Activity Restrictions: WBAT    Mobility  Bed Mobility Overal bed mobility: Modified Independent             General bed mobility comments: HOB up, used rail  Transfers Overall transfer level: Needs assistance Equipment used: Rolling walker (2 wheeled) Transfers: Sit to/from Stand Sit  to Stand: Min guard         General transfer comment: light min guard for safety, cues for hand placement; mild unsteadiness with transition of hands to RW but able to maintain balance  Ambulation/Gait Ambulation/Gait assistance: Min guard Gait Distance (Feet): 180 Feet Assistive device: Rolling walker (2 wheeled) Gait Pattern/deviations: Step-through pattern;Decreased step length - right;Decreased stance time - left;Decreased stride length;Decreased dorsiflexion - left;Decreased weight shift to left;Trunk flexed Gait velocity: decreased   General Gait Details: cues for upright posture, heel-toe step through pattern, increased WB L LE   Stairs Stairs: Yes Stairs assistance: Min guard Stair Management: Two rails;Step to pattern;Forwards Number of Stairs: 12 General stair comments: VC for correct sequencing, min guard for safety; able to perform stairs safely and with no significant balance concerns   Wheelchair Mobility    Modified Rankin (Stroke Patients Only)       Balance Overall balance assessment: Modified Independent                                          Cognition Arousal/Alertness: Awake/alert Behavior During Therapy: WFL for tasks assessed/performed Overall Cognitive Status: Within Functional Limits for tasks assessed                                        Exercises      General Comments        Pertinent Vitals/Pain Pain Assessment: 0-10 Pain Score: 7  Pain Location: L knee Pain Descriptors / Indicators: Aching;Discomfort Pain Intervention(s): Limited activity within patient's tolerance;Premedicated before session;Monitored during session    Home Living                      Prior Function            PT Goals (current goals can now be found in the care plan section) Acute Rehab PT Goals 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 PT Goal Formulation: With patient Time For Goal  Achievement: 04/14/19 Potential to Achieve Goals: Good Progress towards PT goals: Progressing toward goals    Frequency    7X/week      PT Plan Current plan remains appropriate    Co-evaluation              AM-PAC PT "6 Clicks" Mobility   Outcome Measure  Help needed turning from your back to your side while in a flat bed without using bedrails?: None Help needed moving from lying on your back to sitting on the side of a flat bed without using bedrails?: A Little Help needed moving to and from a bed to a chair (including a wheelchair)?: A Little Help needed standing up from a chair using your arms (e.g., wheelchair or bedside chair)?: A Little Help needed to walk in hospital room?: A Little Help needed climbing 3-5 steps with a railing? : A Little 6 Click Score: 19    End of Session Equipment Utilized During Treatment: Gait belt Activity Tolerance: Patient tolerated treatment well Patient left: in chair;with call bell/phone within reach;with chair alarm set Nurse Communication: Mobility status;Other (comment)(ready for DC when MD signs off- no need for second PT session) PT Visit Diagnosis: Pain;Muscle weakness (generalized) (M62.81);Difficulty in walking, not elsewhere classified (R26.2) Pain - Right/Left: Left Pain - part of body: Knee     Time: 4235-3614 PT Time Calculation (min) (ACUTE ONLY): 25 min  Charges:  $Gait Training: 8-22 mins $Self Care/Home Management: 8-22                     Windell Norfolk, DPT, PN1   Supplemental Physical Therapist [place]    Pager [phone] Acute Rehab Office [phone]

## 2019-04-08 NOTE — TOC Progression Note (Signed)
Transition of Care [facility]) - Progression Note    Patient Details  Name: Connie Brady MRN: [id number] Date of Birth: [birth year]  Transition of Care [facility]) CM/SW Contact  Joaquin Courts, RN Phone Number: 04/08/2019, 9:46 AM  Clinical Narrative:   Patient plans to dc home with outpatient PT services and has an appointment on Monday.  Patient has a rolling walker. [employer] to deliver 3-in-1 to bedside.    Expected Discharge Plan: Home/Self Care Barriers to Discharge: No Barriers Identified  Expected Discharge Plan and Services Expected Discharge Plan: Home/Self Care   Discharge Planning Services: CM Consult   Living arrangements for the past 2 months: Single Family Home Expected Discharge Date: 04/08/19               DME Arranged: 3-N-1 DME Agency: [employer] Date DME Agency Contacted: 04/08/19 Time DME Agency Contacted: [phone] Representative spoke with at DME Agency: Jeneen Rinks HH Arranged: NA [place] Agency: NA         Social Determinants of Health ([place]) Interventions    Readmission Risk Interventions No flowsheet data found.

## 2019-04-10 ENCOUNTER — Encounter: Payer: Self-pay | Admitting: *Deleted

## 2019-04-17 ENCOUNTER — Telehealth: Payer: Self-pay | Admitting: Rheumatology

## 2019-04-17 NOTE — Telephone Encounter (Signed)
Patient advised she will need clearance from her surgeon. Patient states she has an appointment with him on 04/19/19. Patient states she will call the office to let us know how that appointment goes.

## 2019-04-17 NOTE — Telephone Encounter (Signed)
Patient called stating she is 2 weeks post knee surgery and is asking if she can restart her Imuran.  Patient requested a return call.

## 2019-04-17 NOTE — Discharge Summary (Signed)
Physician Discharge Summary  Patient ID: Connie Brady MRN: [id number] DOB/AGE: 51-Mar-1972 51 y.o.  Admit date: 04/07/2019 Discharge date: 04/17/2019  Admission Diagnoses: Left knee Osteoarthritis Discharge Diagnoses:  Active Problems:   Osteoarthritis of left knee   Discharged Condition: stable  Hospital Course: patient was admitted on February 19 for a left total knee arthroplasty due to end state osteoarthritis of the left knee. Patient did well during surgery with no  Complications.  Woke up in the operating room and was sent to PACU in stable condition.  Was sent to the postop floor in stable condition She worked with physical therapy with no issues. She was able to get up and walk. No events overnight. Postop day 1: Hemovac drain was removed.  She was able to get up with therapy in the morning and was able to go home in the afternoon. Patient received antibiotics, pain medication and muscle relaxers to go home with.   Consults: Physical Therapy  Significant Diagnostic Studies: None  Treatments: IV hydration, antibiotics: Ancef, analgesia: acetaminophen w/ codeine, anticoagulation: Lovenox, therapies: PT and surgery: Left TKA  Discharge Exam: Blood pressure 112/69, pulse 85, temperature 98.2 F (36.8 C), resp. rate 14, SpO2 98 %. General appearance: alert, cooperative, appears stated age and no distress Extremities: extremities normal, atraumatic, no cyanosis or edema and Homans sign is negative, no sign of DVT Pulses: 2+ and symmetric Skin: Skin color, texture, turgor normal. No rashes or lesions Neurologic: Alert and oriented X 3, normal strength and tone. Normal symmetric reflexes. Normal coordination and gait Incision/Wound: Dressings clean dry and intact  Disposition: Discharge disposition: 01-Home or Self Care       Discharge Instructions    Call MD / Call 911   Complete by: As directed    If you experience chest pain or shortness of breath, CALL 911 and be  transported to the hospital emergency room.  If you develope a fever above 101 F, pus (white drainage) or increased drainage or redness at the wound, or calf pain, call your surgeon's office.   Constipation Prevention   Complete by: As directed    Drink plenty of fluids.  Prune juice may be helpful.  You may use a stool softener, such as Colace (over the counter) 100 mg twice a day.  Use MiraLax (over the counter) for constipation as needed.   Diet - low sodium heart healthy   Complete by: As directed    Discharge instructions   Complete by: As directed    Dr. Sydnee Cabal [facility] [address] [place], [place] [address] [phone]  TOTAL KNEE REPLACEMENT POSTOPERATIVE DIRECTIONS  Knee Rehabilitation, Guidelines Following Surgery  Results after knee surgery are often greatly improved when you follow the exercise, range of motion and muscle strengthening exercises prescribed by your doctor. Safety measures are also important to protect the knee from further injury. Any time any of these exercises cause you to have increased pain or swelling in your knee joint, decrease the amount until you are comfortable again and slowly increase them. If you have problems or questions, call your caregiver or physical therapist for advice.   HOME CARE INSTRUCTIONS  Remove items at home which could result in a fall. This includes throw rugs or furniture in walking pathways.  ICE to the affected knee every three hours for 30 minutes at a time and then as needed for pain and swelling.  Continue to use ice on the knee for pain and swelling from  surgery. You may notice swelling that will progress down to the foot and ankle.  This is normal after surgery.  Elevate the leg when you are not up walking on it.   Continue to use the breathing machine which will help keep your temperature down.  It is common for your temperature to cycle up and down following surgery, especially at night when you are  not up moving around and exerting yourself.  The breathing machine keeps your lungs expanded and your temperature down. Do not place pillow under knee, focus on keeping the knee straight while resting   DIET You may resume your previous home diet once your are discharged from the hospital.  DRESSING / WOUND CARE / SHOWERING Keep the surgical dressing until follow up.  The dressing is water proof, but you need to put extra covering over it like plastic wrap.  IF THE DRESSING FALLS OFF or the wound gets wet inside, change the dressing with sterile gauze.  Please use good hand washing techniques before changing the dressing.  Do not use any lotions or creams on the incision until instructed by your surgeon.   You may start showering once you are discharged home but do not submerge the incision under water.  You are sent home with an ACE bandage on over the leg, this can be removed at 3 days from surgery. At this time you can start showering. Please place the white TED stocking on the surgical leg after. This needs to be worn on the surgical leg for 2 weeks after surgery.   ACTIVITY Walk with your walker as instructed. Use walker as long as suggested by your caregivers. Avoid periods of inactivity such as sitting longer than an hour when not asleep. This helps prevent blood clots.  You may resume a sexual relationship in one month or when given the OK by your doctor.  You may return to work once you are cleared by your doctor.  Do not drive a car for 6 weeks or until released by you surgeon.  Do not drive while taking narcotics.  WEIGHT BEARING Weight bearing as tolerated with assist device (walker, cane, etc) as directed, use it as long as suggested by your surgeon or therapist, typically at least 4-6 weeks.  POSTOPERATIVE CONSTIPATION PROTOCOL Constipation - defined medically as fewer than three stools per week and severe constipation as less than one stool per week.  One of the most common  issues patients have following surgery is constipation.  Even if you have a regular bowel pattern at home, your normal regimen is likely to be disrupted due to multiple reasons following surgery.  Combination of anesthesia, postoperative narcotics, change in appetite and fluid intake all can affect your bowels.  In order to avoid complications following surgery, here are some recommendations in order to help you during your recovery period.  Colace (docusate) - Pick up an over-the-counter form of Colace or another stool softener and take twice a day as long as you are requiring postoperative pain medications.  Take with a full glass of water daily.  If you experience loose stools or diarrhea, hold the colace until you stool forms back up.  If your symptoms do not get better within 1 week or if they get worse, check with your doctor.  Dulcolax (bisacodyl) - Pick up over-the-counter and take as directed by the product packaging as needed to assist with the movement of your bowels.  Take with a full glass of water.  Use this product as needed if not relieved by Colace only.   MiraLax (polyethylene glycol) - Pick up over-the-counter to have on hand.  MiraLax is a solution that will increase the amount of water in your bowels to assist with bowel movements.  Take as directed and can mix with a glass of water, juice, soda, coffee, or tea.  Take if you go more than two days without a movement. Do not use MiraLax more than once per day. Call your doctor if you are still constipated or irregular after using this medication for 7 days in a row.  If you continue to have problems with postoperative constipation, please contact the office for further assistance and recommendations.  If you experience "the worst abdominal pain ever" or develop nausea or vomiting, please contact the office immediatly for further recommendations for treatment.  ITCHING  If you experience itching with your medications, try taking only a  single pain pill, or even half a pain pill at a time.  You can also use Benadryl over the counter for itching or also to help with sleep.   TED HOSE STOCKINGS Wear the elastic stockings on both legs for two weeks following surgery during the day but you may remove then at night for sleeping.  Okay to remove ACE in 3 days, put TED on after  MEDICATIONS See your medication summary on the "After Visit Summary" that the nursing staff will review with you prior to discharge.  You may have some home medications which will be placed on hold until you complete the course of blood thinner medication.  It is important for you to complete the blood thinner medication as prescribed by your surgeon.  Continue your approved medications as instructed at time of discharge. If you were not previously taking any blood thinners prior to surgery please start taking Aspirin 325 mg tabs twice daily for 6 weeks. If you are unable to take Aspirin please let your doctor know.   PRECAUTIONS If you experience chest pain or shortness of breath - call 911 immediately for transfer to the hospital emergency department.  If you develop a fever greater that 101 F, purulent drainage from wound, increased redness or drainage from wound, foul odor from the wound/dressing, or calf pain - CONTACT YOUR SURGEON.                                                   FOLLOW-UP APPOINTMENTS Make sure you keep all of your appointments after your operation with your surgeon and caregivers. You should call the office at the above phone number and make an appointment for approximately two weeks after the date of your surgery or on the date instructed by your surgeon outlined in the "After Visit Summary".   RANGE OF MOTION AND STRENGTHENING EXERCISES  Rehabilitation of the knee is important following a knee injury or an operation. After just a few days of immobilization, the muscles of the thigh which control the knee become weakened and shrink  (atrophy). Knee exercises are designed to build up the tone and strength of the thigh muscles and to improve knee motion. Often times heat used for twenty to thirty minutes before working out will loosen up your tissues and help with improving the range of motion but do not use heat for the first two weeks following surgery.  These exercises can be done on a training (exercise) mat, on the floor, on a table or on a bed. Use what ever works the best and is most comfortable for you Knee exercises include:  Leg Lifts - While your knee is still immobilized in a splint or cast, you can do straight leg raises. Lift the leg to 60 degrees, hold for 3 sec, and slowly lower the leg. Repeat 10-20 times 2-3 times daily. Perform this exercise against resistance later as your knee gets better.  Quad and Hamstring Sets - Tighten up the muscle on the front of the thigh (Quad) and hold for 5-10 sec. Repeat this 10-20 times hourly. Hamstring sets are done by pushing the foot backward against an object and holding for 5-10 sec. Repeat as with quad sets.  Leg Slides: Lying on your back, slowly slide your foot toward your buttocks, bending your knee up off the floor (only go as far as is comfortable). Then slowly slide your foot back down until your leg is flat on the floor again. Angel Wings: Lying on your back spread your legs to the side as far apart as you can without causing discomfort.  A rehabilitation program following serious knee injuries can speed recovery and prevent re-injury in the future due to weakened muscles. Contact your doctor or a physical therapist for more information on knee rehabilitation.   IF YOU ARE TRANSFERRED TO A SKILLED REHAB FACILITY If the patient is transferred to a skilled rehab facility following release from the hospital, a list of the current medications will be sent to the facility for the patient to continue.  When discharged from the skilled rehab facility, please have the facility set up  the patient's [place] prior to being released. Also, the skilled facility will be responsible for providing the patient with their medications at time of release from the facility to include their pain medication, the muscle relaxants, and their blood thinner medication. If the patient is still at the rehab facility at time of the two week follow up appointment, the skilled rehab facility will also need to assist the patient in arranging follow up appointment in our office and any transportation needs.  MAKE SURE YOU:  Understand these instructions.  Get help right away if you are not doing well or get worse.    Pick up stool softner and laxative for home use following surgery while on pain medications. May shower starting three days after surgery. Please use a clean towel to pat the leg dry following showers. Continue to use ice for pain and swelling after surgery. Do not use any lotions or creams on the incision until instructed by you Start Aspirin immediately following surgery.   Do not put a pillow under the knee. Place it under the heel.   Complete by: As directed    Driving restrictions   Complete by: As directed    No driving for two weeks   TED hose   Complete by: As directed    Use stockings (TED hose) for three weeks on both leg(s).  You may remove them at night for sleeping.   Weight bearing as tolerated   Complete by: As directed      Allergies as of 04/08/2019      Reactions   Lyrica [pregabalin] Itching   Percocet [oxycodone-acetaminophen] Rash, Other (See Comments)   Hallucinations      Medication List    TAKE these medications   acidophilus Caps capsule  Take 1 capsule by mouth daily.   azaTHIOprine 50 MG tablet Commonly known as: IMURAN TAKE 1 TABLET BY MOUTH TWICE DAILY   clonazePAM 1 MG tablet Commonly known as: KLONOPIN Take 1 mg by mouth at bedtime.   dexlansoprazole 60 MG capsule Commonly known as: DEXILANT Take 60 mg by mouth  daily.   DULoxetine 60 MG capsule Commonly known as: CYMBALTA Take 60 mg by mouth daily.   ELDERBERRY PO Take 1 capsule by mouth daily.   estradiol-norethindrone 1-0.5 MG tablet Commonly known as: ACTIVELLA Take 1 tablet by mouth daily in the afternoon. 5 pm   gabapentin 300 MG capsule Commonly known as: NEURONTIN TAKE 1 CAPSULE(300 MG) BY MOUTH TWICE DAILY What changed: See the new instructions.   lidocaine 5 % ointment Commonly known as: XYLOCAINE Apply to affected area daily as needed What changed:   how much to take  how to take this  when to take this  reasons to take this  additional instructions   methocarbamol 500 MG tablet Commonly known as: Robaxin Take 1 tablet (500 mg total) by mouth 4 (four) times daily.   multivitamin with minerals Tabs tablet Take 1 tablet by mouth daily.   ondansetron 4 MG tablet Commonly known as: Zofran Take 1 tablet (4 mg total) by mouth daily as needed for nausea or vomiting.   pilocarpine 5 MG tablet Commonly known as: SALAGEN TAKE 1 TABLET BY MOUTH THREE TIMES DAILY AS NEEDED What changed: additional instructions   predniSONE 5 MG tablet Commonly known as: DELTASONE TAKE 1 TABLET BY MOUTH DAILY WITH BREAKFAST What changed: Another medication with the same name was changed. Make sure you understand how and when to take each.   predniSONE 1 MG tablet Commonly known as: DELTASONE TAKE 2 TABLETS BY MOUTH WITH 5MG  TABLET DAILY TO EQUAL 7MG  What changed: See the new instructions.   SYSTANE OP Place 1 drop into both eyes 2 (two) times daily as needed (dry eyes).   traMADol 50 MG tablet Commonly known as: Ultram Take 1 tablet (50 mg total) by mouth every 6 (six) hours as needed.   traZODone 100 MG tablet Commonly known as: DESYREL Take 100 mg by mouth at bedtime.   vitamin B-12 1000 MCG tablet Commonly known as: CYANOCOBALAMIN Take 1,000 mcg by mouth daily.     ASK your doctor about these medications    HYDROmorphone 2 MG tablet Commonly known as: Dilaudid Take 1 tablet (2 mg total) by mouth every 4 (four) hours as needed for up to 7 days for severe pain. Ask about: Should I take this medication?            Discharge Care Instructions  (From admission, onward)         Start     Ordered   04/07/19 0000  Weight bearing as tolerated     04/07/19 1346           Signed: Drue Novel, PA-C [facility] 04/17/2019, 7:54 AM

## 2019-04-21 ENCOUNTER — Telehealth: Payer: Self-pay | Admitting: Rheumatology

## 2019-04-21 NOTE — Telephone Encounter (Signed)
Patient called stating Dr. Theda Sers from [facility] told her "she has no infection and gave her clearance to restart her prescriptions."  Patient requested a return call.

## 2019-04-21 NOTE — Telephone Encounter (Signed)
Attempted to contact the patient and left message for patient to advise patient she may restart her Imuran.

## 2019-04-21 NOTE — Telephone Encounter (Signed)
Okay to restart Imuran.

## 2019-05-04 ENCOUNTER — Other Ambulatory Visit: Payer: Self-pay | Admitting: Rheumatology

## 2019-05-04 NOTE — Telephone Encounter (Addendum)
Last Visit:02/06/2019 telemedicine Next Visit: 06/07/2019 Labs: 04/04/2019 Creat. 1.46 GFR 48, Repeat Creat on 04/07/19 1.40 GFR 21  Okay to refill Imuran?

## 2019-05-04 NOTE — Telephone Encounter (Signed)
Labs sent to Nephrologist

## 2019-05-04 NOTE — Telephone Encounter (Signed)
Patient is not taking Arava.   Ok to refill Imuran. Please forward labs to nephrology.

## 2019-05-26 ENCOUNTER — Other Ambulatory Visit: Payer: Self-pay | Admitting: Rheumatology

## 2019-05-26 NOTE — Telephone Encounter (Signed)
Last Visit:02/06/2019 telemedicine Next Visit: 06/07/2019  Okay to refill per Dr. Estanislado Pandy

## 2019-05-29 ENCOUNTER — Telehealth: Payer: Self-pay | Admitting: Rheumatology

## 2019-05-29 NOTE — Telephone Encounter (Signed)
Patient advised Dr. Estanislado Pandy is able to view labs. Patient advised as some of the labs she had in the hospital were abnormal she may need to have some additional labs. Patient advised she would not need to have them done prior to her appointment she could have them done here in the office as we have our lab tech in the office.

## 2019-05-29 NOTE — Telephone Encounter (Signed)
Patient called to see if she is due for labwork before her appointment on 06/07/19.  Patient states she had "a lot of labwork when she was in the hospital in February" and is checking if Dr. Estanislado Pandy can use those results.

## 2019-05-30 NOTE — Progress Notes (Signed)
Office Visit Note  Patient: Connie Brady             Date of Birth: [birth year]           MRN: [id number]             PCP: Kelton Pillar, MD Referring: Kelton Pillar, MD Visit Date: 06/07/2019 Occupation: @GUAROCC @  Subjective:  Left knee joint pain   History of Present Illness: Connie Brady is a 49 y.o. female with history of polymyositis, Sjogren's and osteoarthritis.  She taking Imuran 50 mg twice daily and prednisone 7 mg po daily.  She states she had a left knee arthroplasty performed by Dr. Theda Sers 8 weeks ago.  She is currently going to physical therapy.  She states she is noticing improvement gradually but her pain level remains a 7/10 on a daily basis.  She states she has been having increased pain in the left 5th toe recently.  She denies any joint swelling.  She continues to have intermittent lower back pain and uses lidocaine ointment as needed for pain relief.   She continues to have chronic sicca symptoms.  She takes pilocarpine 5 mg 3 times daily as needed for sicca symptoms.    Activities of Daily Living:  Patient reports joint stiffness all day  Patient Reports nocturnal pain.  Difficulty dressing/grooming: Denies Difficulty climbing stairs: Denies Difficulty getting out of chair: Denies Difficulty using hands for taps, buttons, cutlery, and/or writing: Denies  Review of Systems  Constitutional: Positive for fatigue.  HENT: Negative for mouth sores, mouth dryness and nose dryness.   Eyes: Negative for itching and dryness.  Respiratory: Negative for shortness of breath, wheezing and difficulty breathing.   Cardiovascular: Negative for chest pain and palpitations.  Gastrointestinal: Negative for blood in stool, constipation and diarrhea.  Endocrine: Positive for increased urination.  Genitourinary: Negative for difficulty urinating and painful urination.  Musculoskeletal: Positive for arthralgias, joint pain, joint swelling, myalgias, morning  stiffness, muscle tenderness and myalgias.  Skin: Negative for color change, rash and redness.  Allergic/Immunologic: Negative for susceptible to infections.  Neurological: Positive for numbness, headaches and weakness. Negative for dizziness and memory loss.  Hematological: Negative for bruising/bleeding tendency.  Psychiatric/Behavioral: Positive for sleep disturbance. Negative for confusion.    PMFS History:  Patient Active Problem List   Diagnosis Date Noted  . Osteoarthritis of left knee 04/07/2019  . Left lateral knee pain 08/24/2017  . S/P arthroscopic surgery of left knee 08/24/2017  . Abnormal cervical Papanicolaou smear 08/31/2016  . Obesity 08/31/2016  . Premenstrual symptom 08/31/2016  . Vitamin D deficiency 08/25/2016  . History of chronic kidney disease 05/07/2016  . Chronic kidney disease (CKD), stage III (moderate) 03/30/2016  . High risk medication use 01/10/2016  . Primary osteoarthritis of both hips 01/10/2016  . Primary osteoarthritis of both knees 01/10/2016  . Primary osteoarthritis of both feet 01/10/2016  . Bilateral plantar fasciitis 01/10/2016  . Chest pain 01/26/2013  . Polymyositis ([place]) 01/26/2013  . Sjoegren syndrome     Past Medical History:  Diagnosis Date  . Anemia   . Chronic kidney disease    stage 3  . GERD (gastroesophageal reflux disease)   . 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 Sjoegren syndrome     Family History  Problem Relation Age of Onset  . Hypertension Sister   . Hypertension Brother   . Diabetes Brother   . Hypertension Sister   . Hypertension Sister   .  Hypertension Brother   . Diabetes Brother    Past Surgical History:  Procedure Laterality Date  . BUNIONECTOMY Bilateral   . KNEE ARTHROPLASTY Left 2018   meniscal tear  . KNEE ARTHROSCOPY WITH LATERAL MENISECTOMY Left 08/24/2017   Procedure: LEFT KNEE ARTHROSCOPY WITH LATERAL MENISECTOMY, CHONDROPLASTY, ARTHROSCOPIC ASSISTED INTERNAL FIXATION LATERAL  TIBIAL PLATEAU;  Surgeon: Sydnee Cabal, MD;  Location: [facility];  Service: Orthopedics;  Laterality: Left;  . KNEE SURGERY     right  . MUSCLE BIOPSY     r/thigh  . PLANTAR FASCIA SURGERY Right   . TOTAL KNEE ARTHROPLASTY Left 04/07/2019   Procedure: TOTAL KNEE ARTHROPLASTY;  Surgeon: Sydnee Cabal, MD;  Location: [facility];  Service: Orthopedics;  Laterality: Left;  adductor canal  . TOTAL SHOULDER ARTHROPLASTY Left 01/12/2017   Social History   Social History Narrative  . Not on file    There is no immunization history on file for this patient.   Objective: Vital Signs: BP 119/81 (BP Location: Left Arm, Patient Position: Sitting, Cuff Size: Normal)   Pulse 80   Resp 15   Ht 5\' 8"  (1.727 m)   Wt 223 lb 9.6 oz (101.4 kg)   BMI 34.00 kg/m    Physical Exam Vitals and nursing note reviewed.  Constitutional:      Appearance: She is well-developed.  HENT:     Head: Normocephalic and atraumatic.  Eyes:     Conjunctiva/sclera: Conjunctivae normal.  Pulmonary:     Effort: Pulmonary effort is normal.  Abdominal:     General: Bowel sounds are normal.     Palpations: Abdomen is soft.  Musculoskeletal:     Cervical back: Normal range of motion.  Lymphadenopathy:     Cervical: No cervical adenopathy.  Skin:    General: Skin is warm and dry.     Capillary Refill: Capillary refill takes less than 2 seconds.  Neurological:     Mental Status: She is alert and oriented to person, place, and time.  Psychiatric:        Behavior: Behavior normal.      Musculoskeletal Exam: C-spine, thoracic spine, and lumbar spine good ROM.  Shoulder joints, elbow joints, wrist joints, MCPs, PIPs, and DIPs good ROM with no synovitis.  Complete fist formation bilaterally.  Hip joints good ROM with no discomfort at this time. Left knee replacement warmth noted. Right knee good ROM with no warmth or effusion.  Ankle joints good ROM with no tenderness or inflammation.  Tenderness over  the left 5th MTP joint.    CDAI Exam: CDAI Score: - Patient Global: -; Provider Global: - Swollen: -; Tender: - Joint Exam 06/07/2019   No joint exam has been documented for this visit   There is currently no information documented on the homunculus. Go to the Rheumatology activity and complete the homunculus joint exam.  Investigation: No additional findings.  Imaging: No results found.  Recent Labs: Lab Results  Component Value Date   WBC 6.6 04/07/2019   HGB 12.7 04/07/2019   PLT 171 04/07/2019   NA 141 04/04/2019   K 3.7 04/04/2019   CL 108 04/04/2019   CO2 25 04/04/2019   GLUCOSE 93 04/04/2019   BUN 13 04/04/2019   CREATININE 1.40 (H) 04/07/2019   BILITOT 0.7 04/04/2019   ALKPHOS 57 04/04/2019   AST 15 04/04/2019   ALT 10 04/04/2019   PROT 7.0 04/04/2019   ALBUMIN 3.7 04/04/2019   CALCIUM 8.9 04/04/2019   GFRAA  51 (L) 04/07/2019    Speciality Comments: No specialty comments available.  Procedures:  No procedures performed Allergies: Lyrica [pregabalin] and Percocet [oxycodone-acetaminophen]   Assessment / Plan:     Visit Diagnoses: Polymyositis ([place]) - History of elevated CK, positive muscle biopsy, myositis panel negative, initial CK 522: She has not had any signs or symptoms of a polymyositis flare.  She is clinically doing well on Imuran 50 mg 1 tablet twice daily and prednisone 7 mg po daily.  She is not experiencing any increased muscle weakness or muscle tenderness at this time.  She is currently going to physical therapy status post left total knee arthroplasty and has continued to notice improvements in her overall range of motion and discomfort.  Her CK was 180 on 12/13/2018.  We will obtain an updated CK with her upcoming lab work in May 2021.  Standing orders for CBC, CMP, and CK are in place.  She was advised to notify us if she develops signs or symptoms of a flare.  She will follow up in 5 months.   High risk medication use - Imuran 50 mg 1 tablet  twice daily and prednisone 7 mg daily.  CBC and CMP were drawn on 04/04/2019.  She will be due to update lab work in May and every 3 months to monitor for drug toxicity.  Standing orders for CBC and CMP are in place.  Sjogren's syndrome with keratoconjunctivitis sicca ([facility]) - ANA 1:40 centromere, +SSA: She continues to have chronic sicca symptoms.  She takes pilocarpine 5 mg TID PRN and systane eye drops as needed for symptomatic relief.    On prednisone therapy - She is taking prednisone 7 mg po daily. She does not need a refill at this time.   Primary osteoarthritis of right knee: She has good ROM with no discomfort. No warmth or effusion of the right knee joint noted on exam.    S/P total knee arthroplasty, left: Performed by Dr. Theda Sers on 04/07/2019.  She is currently going to physical therapy.  She has noticed gradual improvement in her range of motion and overall discomfort.  She continues to rate her pain a 7 out of 10 on a daily basis.  Primary osteoarthritis of both hips: She has good ROM with no discomfort.    Primary osteoarthritis of both feet: She is experiencing pain in the left 5th MTP joint.  No synovitis was noted on exam today.  Her discomfort is likely due to gait changes s/p her left knee replacement. We discussed the importance of wearing proper fitting shoes.   DDD (degenerative disc disease), lumbar: She continues to experience intermittent lower back pain.  She uses lidocaine ointment topically as needed for pain relief.   History of chronic kidney disease - She has seen Dr. Moshe Cipro in the past.    History of vitamin D deficiency: She is taking a vitamin D supplement daily.   Orders: No orders of the defined types were placed in this encounter.  No orders of the defined types were placed in this encounter.     Follow-Up Instructions: Return in about 5 months (around 11/07/2019) for Polymyositis, Sjogren's syndrome, Osteoarthritis.   [name], PA-C  I  examined and evaluated the patient with [name] PA.  Patient has not had any flare of myositis.  She had no increased muscle weakness or tenderness.  She had warmth in her left knee joint on my examination which is replaced.  She is gradually recovering from that.  We will check her labs again in a month to monitor for drug toxicity.  She has been tolerating Imuran well.. The plan of care was discussed as noted above.  Bo Merino, MD  Note - This record has been created using Editor, commissioning.  Chart creation errors have been sought, but may not always  have been located. Such creation errors do not reflect on  the standard of medical care.

## 2019-06-07 ENCOUNTER — Encounter: Payer: Self-pay | Admitting: Rheumatology

## 2019-06-07 ENCOUNTER — Other Ambulatory Visit: Payer: Self-pay

## 2019-06-07 ENCOUNTER — Ambulatory Visit (INDEPENDENT_AMBULATORY_CARE_PROVIDER_SITE_OTHER): Payer: BC Managed Care – PPO | Admitting: Rheumatology

## 2019-06-07 VITALS — BP 119/81 | HR 80 | Resp 15 | Ht 68.0 in | Wt 223.6 lb

## 2019-06-07 DIAGNOSIS — M19071 Primary osteoarthritis, right ankle and foot: Secondary | ICD-10-CM

## 2019-06-07 DIAGNOSIS — Z79899 Other long term (current) drug therapy: Secondary | ICD-10-CM

## 2019-06-07 DIAGNOSIS — M19072 Primary osteoarthritis, left ankle and foot: Secondary | ICD-10-CM

## 2019-06-07 DIAGNOSIS — M1711 Unilateral primary osteoarthritis, right knee: Secondary | ICD-10-CM

## 2019-06-07 DIAGNOSIS — M3501 Sicca syndrome with keratoconjunctivitis: Secondary | ICD-10-CM | POA: Diagnosis not present

## 2019-06-07 DIAGNOSIS — M3320 Polymyositis, organ involvement unspecified: Secondary | ICD-10-CM | POA: Diagnosis not present

## 2019-06-07 DIAGNOSIS — Z87448 Personal history of other diseases of urinary system: Secondary | ICD-10-CM

## 2019-06-07 DIAGNOSIS — Z8639 Personal history of other endocrine, nutritional and metabolic disease: Secondary | ICD-10-CM

## 2019-06-07 DIAGNOSIS — M170 Bilateral primary osteoarthritis of knee: Secondary | ICD-10-CM

## 2019-06-07 DIAGNOSIS — Z7952 Long term (current) use of systemic steroids: Secondary | ICD-10-CM | POA: Diagnosis not present

## 2019-06-07 DIAGNOSIS — M160 Bilateral primary osteoarthritis of hip: Secondary | ICD-10-CM

## 2019-06-07 DIAGNOSIS — M5136 Other intervertebral disc degeneration, lumbar region: Secondary | ICD-10-CM

## 2019-06-07 DIAGNOSIS — M51369 Other intervertebral disc degeneration, lumbar region without mention of lumbar back pain or lower extremity pain: Secondary | ICD-10-CM

## 2019-06-07 DIAGNOSIS — Z96652 Presence of left artificial knee joint: Secondary | ICD-10-CM

## 2019-06-07 NOTE — Patient Instructions (Addendum)
Standing Labs We placed an order today for your standing lab work.    Please come back and get your standing labs in May and every 3 months   CK, CBC, and CMP   We have open lab daily Monday through Thursday from 8:30-12:30 PM and 1:30-4:30 PM and Friday from 8:30-12:30 PM and 1:30-4:00 PM at the office of Dr. Bo Merino.   You may experience shorter wait times on Monday and Friday afternoons. The office is located at [address] [place], [place], [place] [address] No appointment is necessary.   Labs are drawn by [facility].  You may receive a bill from [facility] for your lab work.  If you wish to have your labs drawn at another location, please call the office 24 hours in advance to send orders.  If you have any questions regarding directions or hours of operation,  please call [phone].   Just as a reminder please drink plenty of water prior to coming for your lab work. Thanks!

## 2019-06-13 ENCOUNTER — Other Ambulatory Visit: Payer: Self-pay | Admitting: Rheumatology

## 2019-06-13 NOTE — Telephone Encounter (Addendum)
Last Visit:06/07/2019 Next Visit:10/31/2019  Okay to refill per Dr. Deveshwar  

## 2019-06-22 ENCOUNTER — Other Ambulatory Visit: Payer: Self-pay | Admitting: Rheumatology

## 2019-06-22 NOTE — Telephone Encounter (Signed)
ok 

## 2019-06-22 NOTE — Telephone Encounter (Addendum)
Last Visit: 06/07/2019 Next Visit: 10/31/2019  Last Fill: 04/11/2018  Okay to refill Gabapentin?

## 2019-06-27 ENCOUNTER — Other Ambulatory Visit: Payer: Self-pay

## 2019-06-27 DIAGNOSIS — Z79899 Other long term (current) drug therapy: Secondary | ICD-10-CM

## 2019-06-27 DIAGNOSIS — M3320 Polymyositis, organ involvement unspecified: Secondary | ICD-10-CM

## 2019-06-28 ENCOUNTER — Other Ambulatory Visit: Payer: Self-pay | Admitting: Rheumatology

## 2019-06-28 LAB — COMPLETE METABOLIC PANEL WITH GFR
AG Ratio: 1.4 (calc) (ref 1.0–2.5)
ALT: 8 U/L (ref 6–29)
AST: 16 U/L (ref 10–35)
Albumin: 3.8 g/dL (ref 3.6–5.1)
Alkaline phosphatase (APISO): 71 U/L (ref 37–153)
BUN/Creatinine Ratio: 13 (calc) (ref 6–22)
BUN: 17 mg/dL (ref 7–25)
CO2: 24 mmol/L (ref 20–32)
Calcium: 8.9 mg/dL (ref 8.6–10.4)
Chloride: 106 mmol/L (ref 98–110)
Creat: 1.34 mg/dL — ABNORMAL HIGH (ref 0.50–1.05)
GFR, Est African American: 53 mL/min/{1.73_m2} — ABNORMAL LOW (ref >=60)
GFR, Est Non African American: 46 mL/min/{1.73_m2} — ABNORMAL LOW (ref >=60)
Globulin: 2.8 g/dL (calc) (ref 1.9–3.7)
Glucose, Bld: 105 mg/dL — ABNORMAL HIGH (ref 65–99)
Potassium: 4.1 mmol/L (ref 3.5–5.3)
Sodium: 138 mmol/L (ref 135–146)
Total Bilirubin: 0.3 mg/dL (ref 0.2–1.2)
Total Protein: 6.6 g/dL (ref 6.1–8.1)

## 2019-06-28 LAB — CBC WITH DIFFERENTIAL/PLATELET
Absolute Monocytes: 264 cells/uL (ref 200–950)
Basophils Absolute: 24 cells/uL (ref 0–200)
Basophils Relative: 0.3 %
Eosinophils Absolute: 0 cells/uL — ABNORMAL LOW (ref 15–500)
Eosinophils Relative: 0 %
HCT: 38.1 % (ref 35.0–45.0)
Hemoglobin: 12.2 g/dL (ref 11.7–15.5)
Lymphs Abs: 1136 cells/uL (ref 850–3900)
MCH: 27.5 pg (ref 27.0–33.0)
MCHC: 32 g/dL (ref 32.0–36.0)
MCV: 85.8 fL (ref 80.0–100.0)
MPV: 9.9 fL (ref 7.5–12.5)
Monocytes Relative: 3.3 %
Neutro Abs: 6576 cells/uL (ref 1500–7800)
Neutrophils Relative %: 82.2 %
Platelets: 266 10*3/uL (ref 140–400)
RBC: 4.44 10*6/uL (ref 3.80–5.10)
RDW: 12.6 % (ref 11.0–15.0)
Total Lymphocyte: 14.2 %
WBC: 8 10*3/uL (ref 3.8–10.8)

## 2019-06-28 LAB — CK: Total CK: 156 U/L — ABNORMAL HIGH (ref 29–143)

## 2019-06-28 NOTE — Progress Notes (Signed)
GFR is low but is stable.  CK is elevated and stable.  CBC is normal.

## 2019-06-28 NOTE — Telephone Encounter (Signed)
Last Visit: 06/07/2019 °Next Visit: 10/31/2019 ° °Current Dose per office note on 06/07/2019:prednisone 7 mg daily.  ° °Okay to refill per Dr. Deveshwar  °

## 2019-07-18 ENCOUNTER — Telehealth: Payer: Self-pay | Admitting: Rheumatology

## 2019-07-18 NOTE — Telephone Encounter (Signed)
Patient called requesting a return call to discuss the COVID vaccine and if it is safe to have it now that she is 3 months post surgery.

## 2019-07-18 NOTE — Telephone Encounter (Signed)
Patient returned call to the office. Patient states she was advised by Dr Estanislado Pandy she should not get her Covid Vaccine until 3 months after surgery. Patient states she had her surgery 04/07/2019. Patient would like to know if she may get the vaccine now. Please advise.

## 2019-07-18 NOTE — Telephone Encounter (Signed)
Attempted to contact the patient and left message for patient to call the office.  

## 2019-07-19 NOTE — Telephone Encounter (Signed)
Patient advised there are no restrictions to get Covid vaccine after surgery.  She may get Covid vaccine

## 2019-07-19 NOTE — Telephone Encounter (Signed)
There are no restrictions to get Covid vaccine after surgery.  She may get Covid vaccine.

## 2019-07-29 ENCOUNTER — Other Ambulatory Visit: Payer: Self-pay | Admitting: Rheumatology

## 2019-07-31 NOTE — Telephone Encounter (Signed)
Last Visit:06/07/2019 Next Visit:10/31/2019 Labs: 06/27/2019 GFR is low but is stable. CK is elevated and stable. CBC is normal.  Current Dose per office note 06/07/2019: Imuran 50 mg 1 tablet twice daily  Okay to refill per Dr. Estanislado Pandy

## 2019-08-09 ENCOUNTER — Other Ambulatory Visit: Payer: Self-pay | Admitting: Rheumatology

## 2019-08-09 NOTE — Telephone Encounter (Signed)
Last Visit:06/07/2019 Next Visit:10/31/2019  Okay to refill per Dr. Estanislado Pandy

## 2019-08-28 ENCOUNTER — Other Ambulatory Visit: Payer: Self-pay | Admitting: Rheumatology

## 2019-09-05 ENCOUNTER — Ambulatory Visit (INDEPENDENT_AMBULATORY_CARE_PROVIDER_SITE_OTHER): Payer: BC Managed Care – PPO

## 2019-09-05 ENCOUNTER — Ambulatory Visit: Payer: BC Managed Care – PPO | Admitting: Podiatry

## 2019-09-05 ENCOUNTER — Other Ambulatory Visit: Payer: Self-pay

## 2019-09-05 ENCOUNTER — Encounter: Payer: Self-pay | Admitting: Podiatry

## 2019-09-05 DIAGNOSIS — M7662 Achilles tendinitis, left leg: Secondary | ICD-10-CM

## 2019-09-05 DIAGNOSIS — M7661 Achilles tendinitis, right leg: Secondary | ICD-10-CM | POA: Diagnosis not present

## 2019-09-05 DIAGNOSIS — B353 Tinea pedis: Secondary | ICD-10-CM | POA: Diagnosis not present

## 2019-09-05 MED ORDER — TERBINAFINE HCL 250 MG PO TABS: 250.0000 mg | ORAL_TABLET | Freq: Every day | ORAL | 0 refills | Status: DC

## 2019-09-05 NOTE — Progress Notes (Signed)
knee  Subjective:  Patient ID: Connie Brady, female    DOB: 04/09/1970,  MRN: [id number] HPI Chief Complaint  Patient presents with   Foot Pain    Posterior heel/achilles left - aching x 1 month, treated years ago same problem, cramping at night, no treatment   Skin Problem    Skin left - itching, tried creams   Foot Orthotics    Interested in getting new orthotics   New Patient (Initial Visit)    Est pt 2025    49 y.o. female presents with the above complaint.   ROS: Denies fever chills nausea vomiting muscle aches pains calf pain back pain chest pain shortness of breath.  Past Medical History:  Diagnosis Date   Anemia    Chronic kidney disease    stage 3   GERD (gastroesophageal reflux disease)    Polymyositis ([facility])    also neuropathy of feet   Sjoegren syndrome    Past Surgical History:  Procedure Laterality Date   BUNIONECTOMY Bilateral    KNEE ARTHROPLASTY Left 2018   meniscal tear   KNEE ARTHROSCOPY WITH LATERAL MENISECTOMY Left 08/24/2017   Procedure: LEFT KNEE ARTHROSCOPY WITH LATERAL MENISECTOMY, CHONDROPLASTY, ARTHROSCOPIC ASSISTED INTERNAL FIXATION LATERAL TIBIAL PLATEAU;  Surgeon: Sydnee Cabal, MD;  Location: [facility];  Service: Orthopedics;  Laterality: Left;   KNEE SURGERY     right   MUSCLE BIOPSY     r/thigh   PLANTAR FASCIA SURGERY Right    TOTAL KNEE ARTHROPLASTY Left 04/07/2019   Procedure: TOTAL KNEE ARTHROPLASTY;  Surgeon: Sydnee Cabal, MD;  Location: [facility];  Service: Orthopedics;  Laterality: Left;  adductor canal   TOTAL SHOULDER ARTHROPLASTY Left 01/12/2017    Current Outpatient Medications:    azaTHIOprine (IMURAN) 50 MG tablet, TAKE 1 TABLET BY MOUTH TWICE DAILY, Disp: 180 tablet, Rfl: 0   clonazePAM (KLONOPIN) 1 MG tablet, Take 1 mg by mouth at bedtime. , Disp: , Rfl:    dexlansoprazole (DEXILANT) 60 MG capsule, Take 60 mg by mouth daily., Disp: , Rfl:    DULoxetine (CYMBALTA) 60 MG capsule,  Take 60 mg by mouth daily. , Disp: , Rfl:    estradiol-norethindrone (ACTIVELLA) 1-0.5 MG tablet, Take 1 tablet by mouth daily in the afternoon. 5 pm, Disp: , Rfl:    gabapentin (NEURONTIN) 300 MG capsule, TAKE 1 CAPSULE BY MOUTH TWICE DAILY, Disp: 180 capsule, Rfl: 0   lidocaine (XYLOCAINE) 5 % ointment, APPLY TO THE AFFECTED AREA(S) DAILY AS NEEDED, Disp: 30 g, Rfl: 0   methocarbamol (ROBAXIN) 500 MG tablet, Take 1 tablet (500 mg total) by mouth 4 (four) times daily., Disp: 40 tablet, Rfl: 0   Multiple Vitamin (MULTIVITAMIN WITH MINERALS) TABS tablet, Take 1 tablet by mouth daily., Disp: , Rfl:    ondansetron (ZOFRAN) 4 MG tablet, Take 1 tablet (4 mg total) by mouth daily as needed for nausea or vomiting., Disp: 30 tablet, Rfl: 1   pilocarpine (SALAGEN) 5 MG tablet, TAKE 1 TABLET BY MOUTH THREE TIMES DAILY AS NEEDED, Disp: 90 tablet, Rfl: 0   Polyethyl Glycol-Propyl Glycol (SYSTANE OP), Place 1 drop into both eyes 2 (two) times daily as needed (dry eyes)., Disp: , Rfl:    predniSONE (DELTASONE) 1 MG tablet, TAKE 2 TABLETS BY MOUTH WITH 5MG  TABLET DAILY TO EQUAL 7MG , Disp: 180 tablet, Rfl: 0   predniSONE (DELTASONE) 5 MG tablet, TAKE 1 TABLET BY MOUTH DAILY WITH BREAKFAST, Disp: 90 tablet, Rfl: 0   terbinafine (LAMISIL) 250  MG tablet, Take 1 tablet (250 mg total) by mouth daily., Disp: 30 tablet, Rfl: 0   traMADol (ULTRAM) 50 MG tablet, Take 1 tablet (50 mg total) by mouth every 6 (six) hours as needed., Disp: 20 tablet, Rfl: 0   traZODone (DESYREL) 100 MG tablet, Take 100 mg by mouth at bedtime. , Disp: , Rfl:    VITAMIN D PO, Take by mouth daily., Disp: , Rfl:   Allergies  Allergen Reactions   Lyrica [Pregabalin] Itching   Percocet [Oxycodone-Acetaminophen] Rash and Other (See Comments)    Hallucinations    Review of Systems Objective:  There were no vitals filed for this visit.  General: Well developed, nourished, in no acute distress, alert and oriented x3    Dermatological: Skin is warm, dry and supple bilateral. Nails x 10 are well maintained; remaining integument appears unremarkable at this time. There are no open sores, no preulcerative lesions, no rash or signs of infection present.  She has interdigital maceration with some fissuring between the fourth and fifth toes of the bilateral foot left seems worse than the right.  There is some plantar dry flaky skin with petechial lesions and ruptured vesicular lesions.  This is consistent with tinea pedis.  Vascular: Dorsalis Pedis artery and Posterior Tibial artery pedal pulses are 2/4 bilateral with immedate capillary fill time. Pedal hair growth present. No varicosities and no lower extremity edema present bilateral.   Neruologic: Grossly intact via light touch bilateral. Vibratory intact via tuning fork bilateral. Protective threshold with Semmes Wienstein monofilament intact to all pedal sites bilateral. Patellar and Achilles deep tendon reflexes 2+ bilateral. No Babinski or clonus noted bilateral.   Musculoskeletal: No gross boney pedal deformities bilateral. No pain, crepitus, or limitation noted with foot and ankle range of motion bilateral. Muscular strength 5/5 in all groups tested bilateral.  She has some tenderness on palpation of the posterior superior aspect of the calcaneus.  There is no signs of infection however.  Gait: Unassisted, Nonantalgic.    Radiographs:  Radiographs taken today do not demonstrate any posterior osseous abnormalities.  No spurring and Achilles tendon appears to be relatively normal.  Mild pes planus.  Assessment & Plan:   Assessment: Tinea pedis interdigital tinea pedis left foot.  Insertional Achilles tendinitis pes planus.  Plan: Discussed etiology pathology conservative surgical therapies.  At this point recommended new orthotics with an 8 inch heel raise.  Also put her on Lamisil 1 tablet daily x30 days.     Madi Bonfiglio T. [place], [place]

## 2019-09-25 ENCOUNTER — Other Ambulatory Visit: Payer: Self-pay | Admitting: Rheumatology

## 2019-09-25 NOTE — Telephone Encounter (Signed)
Last Visit:06/07/2019 Next Visit:10/31/2019  Current Dose per office note on 06/07/2019:prednisone 7 mg daily.   Okay to refill per Dr. Estanislado Pandy

## 2019-10-03 ENCOUNTER — Ambulatory Visit: Payer: BC Managed Care – PPO | Admitting: Orthotics

## 2019-10-03 ENCOUNTER — Other Ambulatory Visit: Payer: Self-pay

## 2019-10-03 DIAGNOSIS — M7662 Achilles tendinitis, left leg: Secondary | ICD-10-CM

## 2019-10-03 NOTE — Progress Notes (Signed)
Patient came in today to pick up custom made foot orthotics.  The goals were accomplished and the patient reported no dissatisfaction with said orthotics.  Patient was advised of breakin period and how to report any issues. 

## 2019-10-17 NOTE — Progress Notes (Signed)
Office Visit Note  Patient: Connie Brady             Date of Birth: 03-21-1970           MRN: [id number]             PCP: Kelton Pillar, MD Referring: Kelton Pillar, MD Visit Date: 10/31/2019 Occupation: @GUAROCC @  Subjective:  Medication monitoring.   History of Present Illness: Connie Brady is a 49 y.o. female with history of polymyositis and osteoarthritis.  She has been on Imuran 100 mg p.o. daily and prednisone 7 mg p.o. daily.  She has been tolerating the medications well.  She would like to taper prednisone as possible.  She had left total knee replacement and still is having discomfort in that knee.  None of the other joints are as painful.  She has underlying osteoarthritis in her hands and hip joints which is not as bothersome.  She denies any increased muscle weakness or tenderness.  She denies any dysphagia or shortness of breath.  Activities of Daily Living:  Patient reports morning stiffness for 24  hours.   Patient Reports nocturnal pain.  Difficulty dressing/grooming: Denies Difficulty climbing stairs: Denies Difficulty getting out of chair: Denies Difficulty using hands for taps, buttons, cutlery, and/or writing: Denies  Review of Systems  Constitutional: Positive for fatigue.  HENT: Positive for mouth dryness. Negative for mouth sores and nose dryness.   Eyes: Positive for dryness.  Respiratory: Negative for shortness of breath and difficulty breathing.   Cardiovascular: Negative for chest pain and palpitations.  Gastrointestinal: Negative for blood in stool, constipation and diarrhea.  Endocrine: Positive for increased urination.  Genitourinary: Negative for difficulty urinating.  Musculoskeletal: Positive for arthralgias, joint pain, joint swelling, myalgias, morning stiffness, muscle tenderness and myalgias.  Skin: Negative for color change, rash and redness.  Allergic/Immunologic: Negative for susceptible to infections.  Neurological: Positive  for numbness and headaches. Negative for dizziness, memory loss and weakness.  Hematological: Negative for bruising/bleeding tendency.  Psychiatric/Behavioral: Negative for confusion.    PMFS History:  Patient Active Problem List   Diagnosis Date Noted  . Osteoarthritis of left knee 04/07/2019  . Left lateral knee pain 08/24/2017  . S/P arthroscopic surgery of left knee 08/24/2017  . Abnormal cervical Papanicolaou smear 08/31/2016  . Obesity 08/31/2016  . Premenstrual symptom 08/31/2016  . Vitamin D deficiency 08/25/2016  . History of chronic kidney disease 05/07/2016  . Chronic kidney disease (CKD), stage III (moderate) 03/30/2016  . High risk medication use 01/10/2016  . Primary osteoarthritis of both hips 01/10/2016  . Primary osteoarthritis of both knees 01/10/2016  . Primary osteoarthritis of both feet 01/10/2016  . Bilateral plantar fasciitis 01/10/2016  . Chest pain 01/26/2013  . Polymyositis ([place]) 01/26/2013  . Sjoegren syndrome     Past Medical History:  Diagnosis Date  . Anemia   . Chronic kidney disease    stage 3  . GERD (gastroesophageal reflux disease)   . Polymyositis ([place])    also neuropathy of feet  . Sjoegren syndrome     Family History  Problem Relation Age of Onset  . Hypertension Sister   . Hypertension Brother   . Diabetes Brother   . Hypertension Sister   . Hypertension Sister   . Hypertension Brother   . Diabetes Brother    Past Surgical History:  Procedure Laterality Date  . BUNIONECTOMY Bilateral   . KNEE ARTHROPLASTY Left 2018   meniscal tear  . KNEE ARTHROSCOPY WITH  LATERAL MENISECTOMY Left 08/24/2017   Procedure: LEFT KNEE ARTHROSCOPY WITH LATERAL MENISECTOMY, CHONDROPLASTY, ARTHROSCOPIC ASSISTED INTERNAL FIXATION LATERAL TIBIAL PLATEAU;  Surgeon: Sydnee Cabal, MD;  Location: [facility];  Service: Orthopedics;  Laterality: Left;  . KNEE SURGERY     right  . MUSCLE BIOPSY     r/thigh  . PLANTAR FASCIA SURGERY Right    . TOTAL KNEE ARTHROPLASTY Left 04/07/2019   Procedure: TOTAL KNEE ARTHROPLASTY;  Surgeon: Sydnee Cabal, MD;  Location: [facility];  Service: Orthopedics;  Laterality: Left;  adductor canal  . TOTAL SHOULDER ARTHROPLASTY Left 01/12/2017   Social History   Social History Narrative  . Not on file   Immunization History  Administered Date(s) Administered  . PFIZER SARS-COV-2 Vaccination 08/17/2019, 09/07/2019     Objective: Vital Signs: BP 110/75 (BP Location: Left Arm, Patient Position: Sitting, Cuff Size: Normal)   Pulse 79   Resp 14   Ht 5\' 8"  (1.727 m)   Wt 221 lb 12.8 oz (100.6 kg)   BMI 33.72 kg/m    Physical Exam Vitals and nursing note reviewed.  Constitutional:      Appearance: She is well-developed.  HENT:     Head: Normocephalic and atraumatic.  Eyes:     Conjunctiva/sclera: Conjunctivae normal.  Cardiovascular:     Rate and Rhythm: Normal rate and regular rhythm.     Heart sounds: Normal heart sounds.  Pulmonary:     Effort: Pulmonary effort is normal.     Breath sounds: Normal breath sounds.  Abdominal:     General: Bowel sounds are normal.     Palpations: Abdomen is soft.  Musculoskeletal:     Cervical back: Normal range of motion.  Lymphadenopathy:     Cervical: No cervical adenopathy.  Skin:    General: Skin is warm and dry.     Capillary Refill: Capillary refill takes less than 2 seconds.  Neurological:     Mental Status: She is alert and oriented to person, place, and time.  Psychiatric:        Behavior: Behavior normal.      Musculoskeletal Exam: C-spine thoracic and lumbar spine were in good range of motion.  Shoulder joints, elbow joints, wrist joints were in good range of motion.  She has some DIP and PIP thickening with no synovitis.  Left knee joint is replaced with some warmth.  Right knee joint was in good range of motion without any warmth swelling or effusion.  Ankle joints, MTPs and PIPs with good range of motion.  She had no difficulty  getting up from the chair.  She had good muscle strength in both upper and lower extremities.  CDAI Exam: CDAI Score: -- Patient Global: --; Provider Global: -- Swollen: --; Tender: -- Joint Exam 10/31/2019   No joint exam has been documented for this visit   There is currently no information documented on the homunculus. Go to the Rheumatology activity and complete the homunculus joint exam.  Investigation: No additional findings.  Imaging: No results found.  Recent Labs: Lab Results  Component Value Date   WBC 8.0 06/27/2019   HGB 12.2 06/27/2019   PLT 266 06/27/2019   NA 138 06/27/2019   K 4.1 06/27/2019   CL 106 06/27/2019   CO2 24 06/27/2019   GLUCOSE 105 (H) 06/27/2019   BUN 17 06/27/2019   CREATININE 1.34 (H) 06/27/2019   BILITOT 0.3 06/27/2019   ALKPHOS 57 04/04/2019   AST 16 06/27/2019   ALT 8  06/27/2019   PROT 6.6 06/27/2019   ALBUMIN 3.7 04/04/2019   CALCIUM 8.9 06/27/2019   GFRAA 53 (L) 06/27/2019    Speciality Comments: No specialty comments available.  Procedures:  No procedures performed Allergies: Lyrica [pregabalin] and Percocet [oxycodone-acetaminophen]   Assessment / Plan:     Visit Diagnoses: Polymyositis ([place]) - History of elevated CK, positive muscle biopsy, myositis panel negative, initial CK 522: See had no increased muscle weakness or tenderness.- Plan: CK  High risk medication use - Imuran 50 mg 1 tablet twice daily and prednisone 7 mg daily.  We discussed tapering prednisone by 1 mg if her CK is a stable or better.- Plan: CBC with Differential/Platelet, COMPLETE METABOLIC PANEL WITH GFR  Sjogren's syndrome with keratoconjunctivitis sicca (HCC) - ANA 1:40 centromere, +SSA: pilocarpine 5 mg TID PRN.  Her sicca symptoms are manageable with over-the-counter medications.  On prednisone therapy - prednisone 7 mg po daily.  Side effects of long-term use of prednisone were discussed.  Primary osteoarthritis of right knee-pain is  manageable.  S/P total knee arthroplasty, left - Performed by Dr. Theda Sers on 04/07/2019.  She continues to have discomfort in her left knee joint.  It was warm and swollen on examination.  Primary osteoarthritis of both hips-she has some limitation with range of motion.  Primary osteoarthritis of both feet-currently not painful.  DDD (degenerative disc disease), lumbar-she has chronic pain.  Weight loss and exercises were discussed.  History of vitamin D deficiency  History of chronic kidney disease - She has seen Dr. Moshe Cipro in the past.    Osteopenia of multiple sites-her last DEXA was 3 years ago.  We will get another bone density.  Educated about COVID-19 virus infection-she is fully vaccinated against COVID-19.  She was advised to get a booster.  Use of mask, social distancing and hand hygiene was discussed.  We also discussed possible use of monoclonal antibodies if she gets COVID-19 infection.   Orders: Orders Placed This Encounter  Procedures  . CBC with Differential/Platelet  . COMPLETE METABOLIC PANEL WITH GFR  . CK   No orders of the defined types were placed in this encounter.    Follow-Up Instructions: Return in about 3 months (around 01/30/2020) for Polymyositis, Osteoarthritis.   Bo Merino, MD  Note - This record has been created using Editor, commissioning.  Chart creation errors have been sought, but may not always  have been located. Such creation errors do not reflect on  the standard of medical care.

## 2019-10-31 ENCOUNTER — Other Ambulatory Visit: Payer: Self-pay

## 2019-10-31 ENCOUNTER — Encounter: Payer: Self-pay | Admitting: Rheumatology

## 2019-10-31 ENCOUNTER — Ambulatory Visit (INDEPENDENT_AMBULATORY_CARE_PROVIDER_SITE_OTHER): Payer: BC Managed Care – PPO | Admitting: Rheumatology

## 2019-10-31 VITALS — BP 110/75 | HR 79 | Resp 14 | Ht 68.0 in | Wt 221.8 lb

## 2019-10-31 DIAGNOSIS — M1711 Unilateral primary osteoarthritis, right knee: Secondary | ICD-10-CM

## 2019-10-31 DIAGNOSIS — M19072 Primary osteoarthritis, left ankle and foot: Secondary | ICD-10-CM

## 2019-10-31 DIAGNOSIS — Z79899 Other long term (current) drug therapy: Secondary | ICD-10-CM | POA: Diagnosis not present

## 2019-10-31 DIAGNOSIS — M3501 Sicca syndrome with keratoconjunctivitis: Secondary | ICD-10-CM | POA: Diagnosis not present

## 2019-10-31 DIAGNOSIS — M5136 Other intervertebral disc degeneration, lumbar region: Secondary | ICD-10-CM

## 2019-10-31 DIAGNOSIS — Z7952 Long term (current) use of systemic steroids: Secondary | ICD-10-CM

## 2019-10-31 DIAGNOSIS — M8589 Other specified disorders of bone density and structure, multiple sites: Secondary | ICD-10-CM

## 2019-10-31 DIAGNOSIS — M19071 Primary osteoarthritis, right ankle and foot: Secondary | ICD-10-CM

## 2019-10-31 DIAGNOSIS — Z87448 Personal history of other diseases of urinary system: Secondary | ICD-10-CM

## 2019-10-31 DIAGNOSIS — M3320 Polymyositis, organ involvement unspecified: Secondary | ICD-10-CM | POA: Diagnosis not present

## 2019-10-31 DIAGNOSIS — Z96652 Presence of left artificial knee joint: Secondary | ICD-10-CM

## 2019-10-31 DIAGNOSIS — Z8639 Personal history of other endocrine, nutritional and metabolic disease: Secondary | ICD-10-CM

## 2019-10-31 DIAGNOSIS — M160 Bilateral primary osteoarthritis of hip: Secondary | ICD-10-CM

## 2019-10-31 DIAGNOSIS — Z7189 Other specified counseling: Secondary | ICD-10-CM

## 2019-10-31 NOTE — Patient Instructions (Signed)

## 2019-10-31 NOTE — Addendum Note (Signed)
Addended by: Francis Gaines C on: 10/31/2019 11:53 AM   Modules accepted: Orders

## 2019-11-01 LAB — COMPLETE METABOLIC PANEL WITH GFR
AG Ratio: 1.3 (calc) (ref 1.0–2.5)
ALT: 8 U/L (ref 6–29)
AST: 13 U/L (ref 10–35)
Albumin: 3.5 g/dL — ABNORMAL LOW (ref 3.6–5.1)
Alkaline phosphatase (APISO): 61 U/L (ref 37–153)
BUN/Creatinine Ratio: 9 (calc) (ref 6–22)
BUN: 14 mg/dL (ref 7–25)
CO2: 27 mmol/L (ref 20–32)
Calcium: 8.6 mg/dL (ref 8.6–10.4)
Chloride: 107 mmol/L (ref 98–110)
Creat: 1.59 mg/dL — ABNORMAL HIGH (ref 0.50–1.05)
GFR, Est African American: 43 mL/min/{1.73_m2} — ABNORMAL LOW (ref >=60)
GFR, Est Non African American: 37 mL/min/{1.73_m2} — ABNORMAL LOW (ref >=60)
Globulin: 2.7 g/dL (calc) (ref 1.9–3.7)
Glucose, Bld: 95 mg/dL (ref 65–99)
Potassium: 3.6 mmol/L (ref 3.5–5.3)
Sodium: 140 mmol/L (ref 135–146)
Total Bilirubin: 0.3 mg/dL (ref 0.2–1.2)
Total Protein: 6.2 g/dL (ref 6.1–8.1)

## 2019-11-01 LAB — CBC WITH DIFFERENTIAL/PLATELET
Absolute Monocytes: 400 cells/uL (ref 200–950)
Basophils Absolute: 21 cells/uL (ref 0–200)
Basophils Relative: 0.3 %
Eosinophils Absolute: 21 cells/uL (ref 15–500)
Eosinophils Relative: 0.3 %
HCT: 37 % (ref 35.0–45.0)
Hemoglobin: 12 g/dL (ref 11.7–15.5)
Lymphs Abs: 925 cells/uL (ref 850–3900)
MCH: 28.1 pg (ref 27.0–33.0)
MCHC: 32.4 g/dL (ref 32.0–36.0)
MCV: 86.7 fL (ref 80.0–100.0)
MPV: 10.1 fL (ref 7.5–12.5)
Monocytes Relative: 5.8 %
Neutro Abs: 5534 cells/uL (ref 1500–7800)
Neutrophils Relative %: 80.2 %
Platelets: 224 10*3/uL (ref 140–400)
RBC: 4.27 10*6/uL (ref 3.80–5.10)
RDW: 13.5 % (ref 11.0–15.0)
Total Lymphocyte: 13.4 %
WBC: 6.9 10*3/uL (ref 3.8–10.8)

## 2019-11-01 LAB — CK: Total CK: 128 U/L (ref 29–143)

## 2019-11-01 NOTE — Progress Notes (Signed)
Please notify patient that her CK is normal.  Please forward labs to Dr. Moshe Cipro her nephrologist.  GFR is lower.  She may decrease prednisone to 6 mg p.o. daily.

## 2019-11-13 ENCOUNTER — Other Ambulatory Visit: Payer: Self-pay | Admitting: Rheumatology

## 2019-11-13 NOTE — Telephone Encounter (Signed)
Last Visit: 10/31/2019 Next Visit: 01/31/2020 Labs: 10/31/2019 GFR is lower.   Current Dose per office note on 10/31/2019: Imuran 50 mg 1 tablet twice daily  Dx: Polymyositis  Okay to refill Imuran?

## 2019-11-23 ENCOUNTER — Telehealth: Payer: Self-pay | Admitting: *Deleted

## 2019-11-23 NOTE — Telephone Encounter (Signed)
Received DEXA results from [facility].  Date of Scan: 11/16/2019 Lowest T-score and site measured: -1.3 Left Femoral Neck. Significant changes in BMD and site measured (5% and above): -5% Right Total Femur BMD 0.904  Current Regimen:Vitamin D and Weight bearing  Recommendation: Schedule an appointment to discuss treatment options.

## 2019-11-23 NOTE — Progress Notes (Signed)
Office Visit Note  Patient: Connie Brady             Date of Birth: 1970/06/12           MRN: [id number]             PCP: Kelton Pillar, MD Referring: Kelton Pillar, MD Visit Date: 11/28/2019 Occupation: @GUAROCC @  Subjective:  Discuss DEXA results   History of Present Illness: Connie Brady is a 49 y.o. female with history of polymyositis, sjogren's, osteoarthritis, and osteopenia. She is taking Imuran 50 mg 1 tablet twice daily and prednisone 6 mg po daily.  She reduced prednisone from 7 mg to 6 mg in September 2021.  She has not had any new or worsening symptoms since reducing the dose of prednisone. She denies any increased muscle weakness or muscle tenderness at this time.  She continues to have pain in the left knee joint but her discomfort is slowly starting to improve.  She denies any joint swelling at this time.  She presents today to discuss DEXA results. She is not taking calcium or vitamin D at this time.  She has not had any recent infections.  She has received the annual influenza vaccine and both covid-19 vaccine doses.      Activities of Daily Living:  Patient reports joint stiffness all day  Patient Denies nocturnal pain.  Difficulty dressing/grooming: Denies Difficulty climbing stairs: Denies Difficulty getting out of chair: Denies Difficulty using hands for taps, buttons, cutlery, and/or writing: Denies  Review of Systems  Constitutional: Positive for fatigue.  HENT: Positive for mouth dryness.   Eyes: Positive for dryness.  Respiratory: Negative for shortness of breath.   Cardiovascular: Positive for swelling in legs/feet.  Gastrointestinal: Negative for constipation.  Endocrine: Positive for cold intolerance, heat intolerance, excessive thirst and increased urination.  Genitourinary: Negative for painful urination.  Musculoskeletal: Positive for arthralgias, joint pain, joint swelling, muscle weakness, morning stiffness and muscle tenderness.    Skin: Negative for rash.  Allergic/Immunologic: Negative for susceptible to infections.  Neurological: Positive for numbness and weakness. Negative for dizziness and headaches.  Hematological: Negative for bruising/bleeding tendency.  Psychiatric/Behavioral: Positive for sleep disturbance.    PMFS History:  Patient Active Problem List   Diagnosis Date Noted   Osteoarthritis of left knee 04/07/2019   Left lateral knee pain 08/24/2017   S/P arthroscopic surgery of left knee 08/24/2017   Abnormal cervical Papanicolaou smear 08/31/2016   Obesity 08/31/2016   Premenstrual symptom 08/31/2016   Vitamin D deficiency 08/25/2016   History of chronic kidney disease 05/07/2016   Chronic kidney disease (CKD), stage III (moderate) (HCC) 03/30/2016   High risk medication use 01/10/2016   Primary osteoarthritis of both hips 01/10/2016   Primary osteoarthritis of both knees 01/10/2016   Primary osteoarthritis of both feet 01/10/2016   Bilateral plantar fasciitis 01/10/2016   Chest pain 01/26/2013   Polymyositis ([place]) 01/26/2013   Sjoegren syndrome     Past Medical History:  Diagnosis Date   Anemia    Chronic kidney disease    stage 3   GERD (gastroesophageal reflux disease)    Polymyositis (HCC)    also neuropathy of feet   Sjoegren syndrome     Family History  Problem Relation Age of Onset   Hypertension Sister    Hypertension Brother    Diabetes Brother    Hypertension Sister    Hypertension Sister    Hypertension Brother    Diabetes Brother    Past  Surgical History:  Procedure Laterality Date   BUNIONECTOMY Bilateral    KNEE ARTHROPLASTY Left 2018   meniscal tear   KNEE ARTHROSCOPY WITH LATERAL MENISECTOMY Left 08/24/2017   Procedure: LEFT KNEE ARTHROSCOPY WITH LATERAL MENISECTOMY, CHONDROPLASTY, ARTHROSCOPIC ASSISTED INTERNAL FIXATION LATERAL TIBIAL PLATEAU;  Surgeon: Sydnee Cabal, MD;  Location: [place];  Service:  Orthopedics;  Laterality: Left;   KNEE SURGERY     right   MUSCLE BIOPSY     r/thigh   PLANTAR FASCIA SURGERY Right    TOTAL KNEE ARTHROPLASTY Left 04/07/2019   Procedure: TOTAL KNEE ARTHROPLASTY;  Surgeon: Sydnee Cabal, MD;  Location: [facility];  Service: Orthopedics;  Laterality: Left;  adductor canal   TOTAL SHOULDER ARTHROPLASTY Left 01/12/2017   Social History   Social History Narrative   Not on file   Immunization History  Administered Date(s) Administered   PFIZER SARS-COV-2 Vaccination 08/17/2019, 09/07/2019     Objective: Vital Signs: BP 100/65 (BP Location: Left Arm, Patient Position: Sitting, Cuff Size: Normal)    Pulse 99    Resp 14    Ht 5\' 8"  (1.727 m)    Wt 219 lb 9.6 oz (99.6 kg)    BMI 33.39 kg/m    Physical Exam Vitals and nursing note reviewed.  Constitutional:      Appearance: She is well-developed.  HENT:     Head: Normocephalic and atraumatic.  Eyes:     Conjunctiva/sclera: Conjunctivae normal.  Pulmonary:     Effort: Pulmonary effort is normal.  Abdominal:     Palpations: Abdomen is soft.  Musculoskeletal:     Cervical back: Normal range of motion.  Skin:    General: Skin is warm and dry.     Capillary Refill: Capillary refill takes less than 2 seconds.  Neurological:     Mental Status: She is alert and oriented to person, place, and time.  Psychiatric:        Behavior: Behavior normal.      Musculoskeletal Exam:  C-spine, thoracic spine, and lumbar spine good ROM.  Shoulder joints, elbow joints, wrist joints, MCPs, PIPs, and DIPs good ROM with no synovitis.  Complete fist formation bilaterally. Hip joints good ROM with no discomfort.  Left knee replacement warmth but no effusion noted.  Right knee has good ROM with no warmth or effusion.  Ankle joints good ROM with no tenderness or inflammation.    CDAI Exam: CDAI Score: -- Patient Global: --; Provider Global: -- Swollen: --; Tender: -- Joint Exam 11/28/2019   No joint exam has  been documented for this visit   There is currently no information documented on the homunculus. Go to the Rheumatology activity and complete the homunculus joint exam.  Investigation: No additional findings.  Imaging: No results found.  Recent Labs: Lab Results  Component Value Date   WBC 6.9 10/31/2019   HGB 12.0 10/31/2019   PLT 224 10/31/2019   NA 140 10/31/2019   K 3.6 10/31/2019   CL 107 10/31/2019   CO2 27 10/31/2019   GLUCOSE 95 10/31/2019   BUN 14 10/31/2019   CREATININE 1.59 (H) 10/31/2019   BILITOT 0.3 10/31/2019   ALKPHOS 57 04/04/2019   AST 13 10/31/2019   ALT 8 10/31/2019   PROT 6.2 10/31/2019   ALBUMIN 3.7 04/04/2019   CALCIUM 8.6 10/31/2019   GFRAA 43 (L) 10/31/2019    Speciality Comments: No specialty comments available.  Procedures:  No procedures performed Allergies: Lyrica [pregabalin] and Percocet [oxycodone-acetaminophen]  Assessment / Plan:     Visit Diagnoses: Polymyositis ([place]) - History of elevated CK, positive muscle biopsy, myositis panel negative, initial CK 522: She has not had any signs or symptoms of a polymyositis flare.  She is not experiencing any muscle weakness or muscle tenderness at this time.  She has no difficulty rising from a seated position or raising her arms above her head.  She is clinically doing well on Imuran 50 mg 1 tablet by mouth twice daily and prednisone 6 mg by mouth daily.  She reduced the dose of prednisone from 7 mg to 6 mg daily after her CK was 128 on 10/31/19.  She has not experienced any new or worsening symptoms since reducing the dose of prednisone.  We will continue to monitor CK every 3 months and try to further reduce the dose of prednisone accordingly.  She will continue taking Imuran 50 mg 1 tablet by mouth twice daily. She was advised to notify us if she develops signs or symptoms of a flare.  She will follow up in December 2021.   High risk medication use -  Imuran 50 mg 1 tablet twice daily and  prednisone 6 mg daily.  CBC and CMP were updated on 10/31/19.  She will be due to update lab work in December and every 3 months to monitor for drug toxicity.  Standing orders for CBC and CMP remain in place.  She has received both covid-19 vaccinations and was encouraged to receive the 3rd dose. She was advised to avoid taking tylenol and NSAIDs 24 hours prior to the 3rd dose. She was advised to notify us if she develops the covid-19 infection in order to receive the antibody infusion.  She was encouraged to continue to wear a mask and social distance.  She voiced understanding.   Sjogren's syndrome with keratoconjunctivitis sicca (HCC) - ANA 1:40 centromere, +SSA: pilocarpine 5 mg TID PRN.  On prednisone therapy - She is taking Prednisone 6 mg by mouth daily. She does not need any refills at this time. She is aware of potential long term adverse effects of prednisone use.   Osteopenia of multiple sites - DEXA updated on 11/16/19: Left femoral neck BMD 0.759 with T-score -1.3. Negative 5% change in BMD of right total femur noted. Previous DEXA 09/07/16: Z-score -0.8. She has a known history of polymyositis and is on long term prednisone. She is currently taking 6 mg of prednisone daily. She has not had any recent falls or fractures.  Results of DEXA were discussed in detail and all questions were addressed.  She is not currently taking calcium or vitamin D.  She was given a handout of information about calcium and vitamin D supplementation.  We will check Calcium and vitamin D level today.   Different treatment options were discussed today in detail.  She will be starting on Fosamax 70 mg 1 tablet by mouth once weekly. Indications, contraindications, and potential side effects of fosamax were discussed today.  Prescription instructions were reviewed in detail.  Prescription pending lab results. She was advised to discuss starting on fosamax with her nephrologist and to notify her dentist if she will be having  any invasive dental procedures.   She was advised to notify us if she cannot tolerate taking Fosamax.  We discussed that reclast may be alternative option if her reflux worsens.   Recheck DEXA in September 2023.   Primary osteoarthritis of right knee: She has good ROM with no discomfort.  No  warmth or effusion noted.   S/P total knee arthroplasty, left: Chronic pain.  She has good ROM with warmth but no effusion. Her discomfort and ROM have slowly started to improve.   Primary osteoarthritis of both hips: She has good ROM of both hip joints with no discomfort.  No groin pain at this time.   Primary osteoarthritis of both feet: She is not experiencing any discomfort in her feet at this time.   DDD (degenerative disc disease), lumbar: She is not experiencing any discomfort in her lower back at this time. No symptoms of radiculopathy.   History of vitamin D deficiency: Order for Vitamin D released.   History of chronic kidney disease: She is followed by a nephrologist.  She was advised to reach out to her nephrologist to discuss starting on fosamax.   Orders: Orders Placed This Encounter  Procedures   VITAMIN D 25 Hydroxy (Vit-D Deficiency, Fractures)   CBC with Differential/Platelet   COMPLETE METABOLIC PANEL WITH GFR   No orders of the defined types were placed in this encounter.    Follow-Up Instructions: Return for Polymyositis, Sjogren's syndrome.   Ofilia Neas, PA-C  Note - This record has been created using Dragon software.  Chart creation errors have been sought, but may not always  have been located. Such creation errors do not reflect on  the standard of medical care.

## 2019-11-28 ENCOUNTER — Other Ambulatory Visit: Payer: Self-pay

## 2019-11-28 ENCOUNTER — Ambulatory Visit (INDEPENDENT_AMBULATORY_CARE_PROVIDER_SITE_OTHER): Payer: BC Managed Care – PPO | Admitting: Physician Assistant

## 2019-11-28 ENCOUNTER — Encounter: Payer: Self-pay | Admitting: Physician Assistant

## 2019-11-28 VITALS — BP 100/65 | HR 99 | Resp 14 | Ht 68.0 in | Wt 219.6 lb

## 2019-11-28 DIAGNOSIS — Z87448 Personal history of other diseases of urinary system: Secondary | ICD-10-CM

## 2019-11-28 DIAGNOSIS — Z7952 Long term (current) use of systemic steroids: Secondary | ICD-10-CM

## 2019-11-28 DIAGNOSIS — Z8639 Personal history of other endocrine, nutritional and metabolic disease: Secondary | ICD-10-CM

## 2019-11-28 DIAGNOSIS — M5136 Other intervertebral disc degeneration, lumbar region: Secondary | ICD-10-CM

## 2019-11-28 DIAGNOSIS — M3501 Sicca syndrome with keratoconjunctivitis: Secondary | ICD-10-CM

## 2019-11-28 DIAGNOSIS — M8589 Other specified disorders of bone density and structure, multiple sites: Secondary | ICD-10-CM

## 2019-11-28 DIAGNOSIS — M3320 Polymyositis, organ involvement unspecified: Secondary | ICD-10-CM

## 2019-11-28 DIAGNOSIS — M19071 Primary osteoarthritis, right ankle and foot: Secondary | ICD-10-CM

## 2019-11-28 DIAGNOSIS — Z96652 Presence of left artificial knee joint: Secondary | ICD-10-CM

## 2019-11-28 DIAGNOSIS — M160 Bilateral primary osteoarthritis of hip: Secondary | ICD-10-CM

## 2019-11-28 DIAGNOSIS — M1711 Unilateral primary osteoarthritis, right knee: Secondary | ICD-10-CM

## 2019-11-28 DIAGNOSIS — Z79899 Other long term (current) drug therapy: Secondary | ICD-10-CM

## 2019-11-28 DIAGNOSIS — M19072 Primary osteoarthritis, left ankle and foot: Secondary | ICD-10-CM

## 2019-11-28 DIAGNOSIS — Z5181 Encounter for therapeutic drug level monitoring: Secondary | ICD-10-CM

## 2019-11-28 NOTE — Telephone Encounter (Signed)
Pending lab results, patient will be starting fosamax per [name], PA-C.

## 2019-11-29 LAB — CBC WITH DIFFERENTIAL/PLATELET
Absolute Monocytes: 347 cells/uL (ref 200–950)
Basophils Absolute: 19 cells/uL (ref 0–200)
Basophils Relative: 0.3 %
Eosinophils Absolute: 0 cells/uL — ABNORMAL LOW (ref 15–500)
Eosinophils Relative: 0 %
HCT: 39 % (ref 35.0–45.0)
Hemoglobin: 12.6 g/dL (ref 11.7–15.5)
Lymphs Abs: 887 cells/uL (ref 850–3900)
MCH: 28.3 pg (ref 27.0–33.0)
MCHC: 32.3 g/dL (ref 32.0–36.0)
MCV: 87.4 fL (ref 80.0–100.0)
MPV: 10.3 fL (ref 7.5–12.5)
Monocytes Relative: 5.6 %
Neutro Abs: 4948 cells/uL (ref 1500–7800)
Neutrophils Relative %: 79.8 %
Platelets: 242 10*3/uL (ref 140–400)
RBC: 4.46 10*6/uL (ref 3.80–5.10)
RDW: 13.3 % (ref 11.0–15.0)
Total Lymphocyte: 14.3 %
WBC: 6.2 10*3/uL (ref 3.8–10.8)

## 2019-11-29 LAB — COMPLETE METABOLIC PANEL WITH GFR
AG Ratio: 1.3 (calc) (ref 1.0–2.5)
ALT: 6 U/L (ref 6–29)
AST: 12 U/L (ref 10–35)
Albumin: 3.7 g/dL (ref 3.6–5.1)
Alkaline phosphatase (APISO): 63 U/L (ref 37–153)
BUN/Creatinine Ratio: 12 (calc) (ref 6–22)
BUN: 15 mg/dL (ref 7–25)
CO2: 25 mmol/L (ref 20–32)
Calcium: 8.9 mg/dL (ref 8.6–10.4)
Chloride: 108 mmol/L (ref 98–110)
Creat: 1.23 mg/dL — ABNORMAL HIGH (ref 0.50–1.05)
GFR, Est African American: 59 mL/min/{1.73_m2} — ABNORMAL LOW (ref >=60)
GFR, Est Non African American: 51 mL/min/{1.73_m2} — ABNORMAL LOW (ref >=60)
Globulin: 2.9 g/dL (calc) (ref 1.9–3.7)
Glucose, Bld: 96 mg/dL (ref 65–99)
Potassium: 4 mmol/L (ref 3.5–5.3)
Sodium: 140 mmol/L (ref 135–146)
Total Bilirubin: 0.5 mg/dL (ref 0.2–1.2)
Total Protein: 6.6 g/dL (ref 6.1–8.1)

## 2019-11-29 LAB — VITAMIN D 25 HYDROXY (VIT D DEFICIENCY, FRACTURES): Vit D, 25-Hydroxy: 45 ng/mL (ref 30–100)

## 2019-11-29 MED ORDER — ALENDRONATE SODIUM 70 MG PO TABS: 70.0000 mg | ORAL_TABLET | ORAL | 0 refills | Status: DC

## 2019-11-29 NOTE — Progress Notes (Signed)
Vitamin D WNL.  Please encourage the patient to take a maintenance dose of vitamin D. Absolute eosinophils are 0-stable. WBC count is WNL.  Rest of CBC WNL.   Creatinine remains elevated-1.23 and GFR has improved to 59.  Please forward lab work to nephrologist.  She will be starting on fosamax 70 mg 1 tablet by mouth once weekly.

## 2019-11-29 NOTE — Telephone Encounter (Signed)
Labs resulted: Eosinophils Absolute 0, Creatinine 1.23, GFR 59  Per office note on 11/28/2019: Fosamax 70 mg 1 tablet by mouth once weekly

## 2019-12-02 IMAGING — MR MR KNEE*L* W/O CM
5 of 7 series · 25 of 40 positions shown · non-contrast
Comparison: None.

CLINICAL DATA: Left knee pain radiating to the tibia and fibula x
2-3 years, pain medially and laterally, 2 prior surgeries to repair
meniscus and bone spur

EXAM:
MRI OF THE LEFT KNEE WITHOUT CONTRAST
TECHNIQUE: Multiplanar, multisequence MR imaging of the knee was performed. No
intravenous contrast was administered.

[Series 2: T2 fat-sat · axial · 4.0mm · 0.31mm/px · z∈[-117,+28]mm · 6 of 30 slices shown (1 of 2)]
[im 1/30]
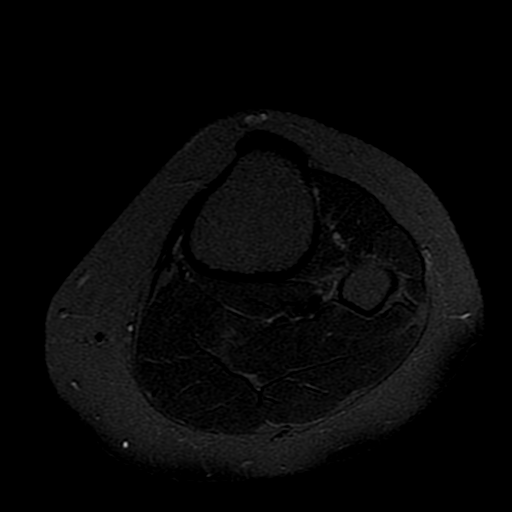
[im 6/30]
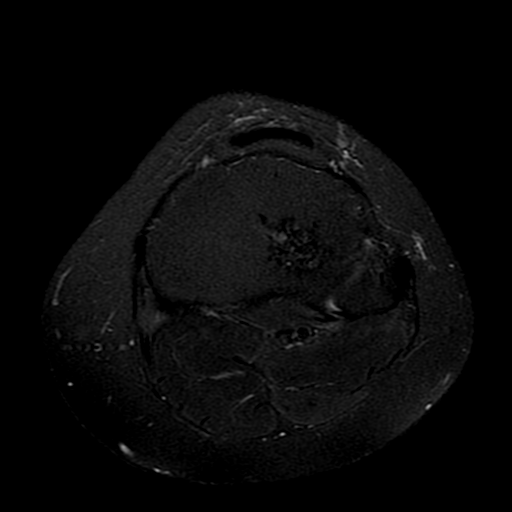
[im 12/30]
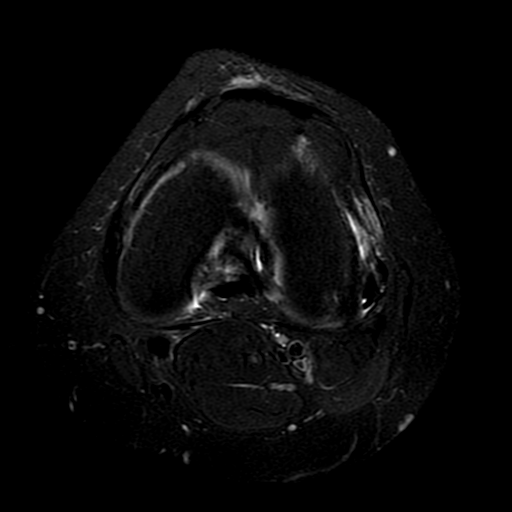
[im 18/30]
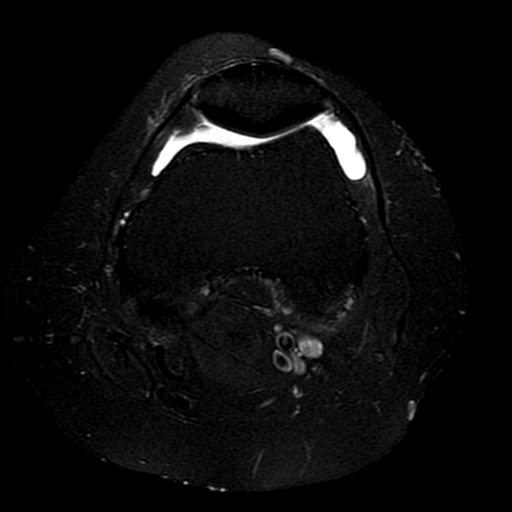
[im 24/30]
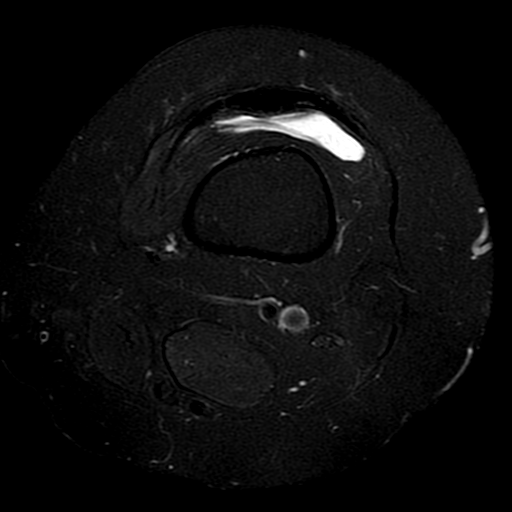
[im 30/30]
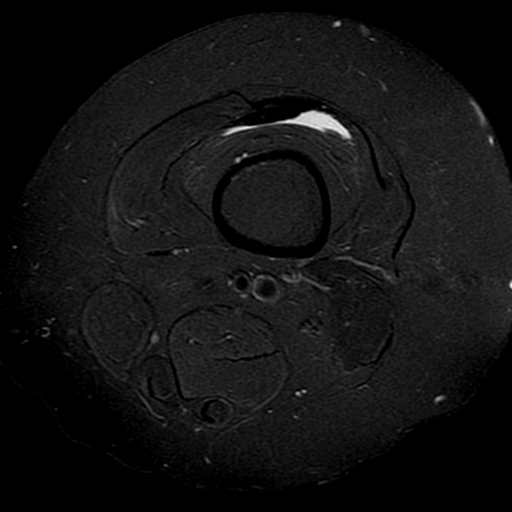

[Series 4: PD fat-sat · coronal · 4.0mm · 0.62mm/px · 6 of 25 slices shown (1 of 3)]
[im 1/25]
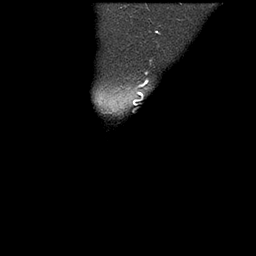
[im 5/25]
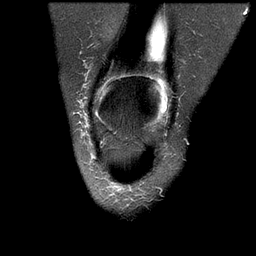
[im 10/25]
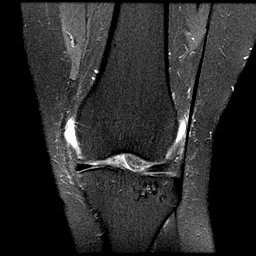
[im 15/25]
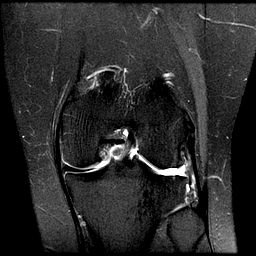
[im 20/25]
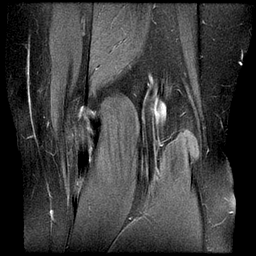
[im 25/25]
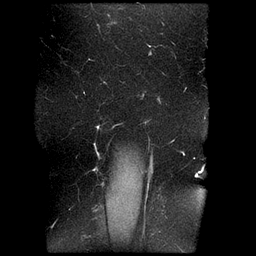

[Series 5: T2 fat-sat · coronal · 4.0mm · 0.31mm/px · 3 of 25 slices shown (2 of 2)]
[im 1/25]
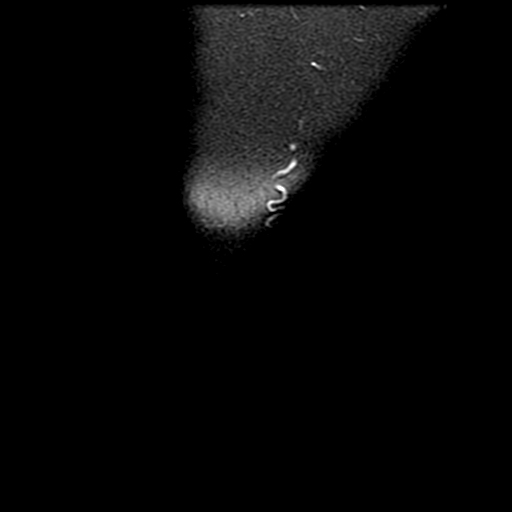
[im 5/25]
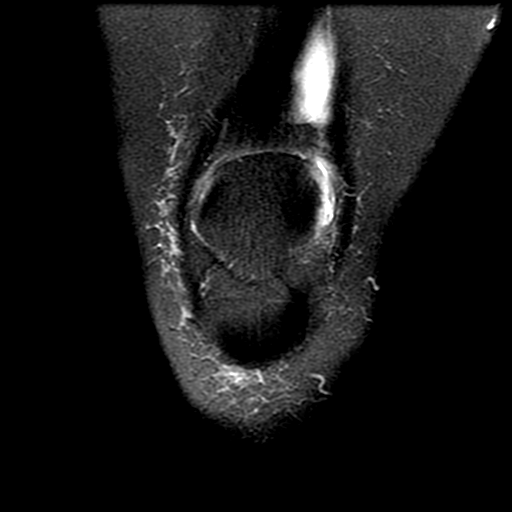
[im 10/25]
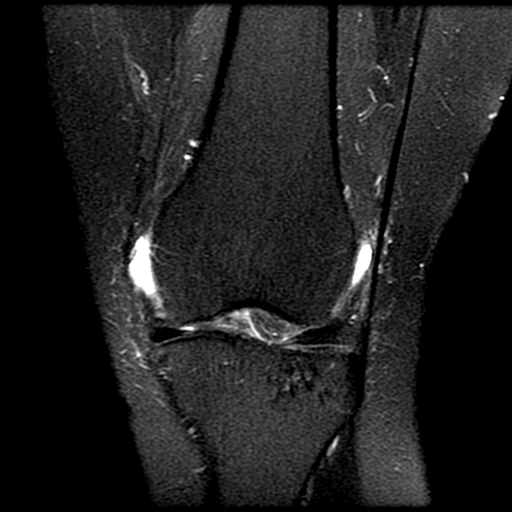

[Series 6: PD fat-sat · sagittal · 3.0mm · 0.31mm/px · 7 of 29 slices shown (2 of 3)]
[im 1/29]
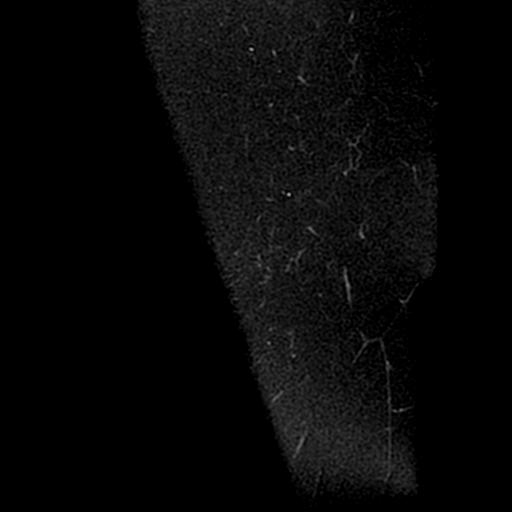
[im 5/29]
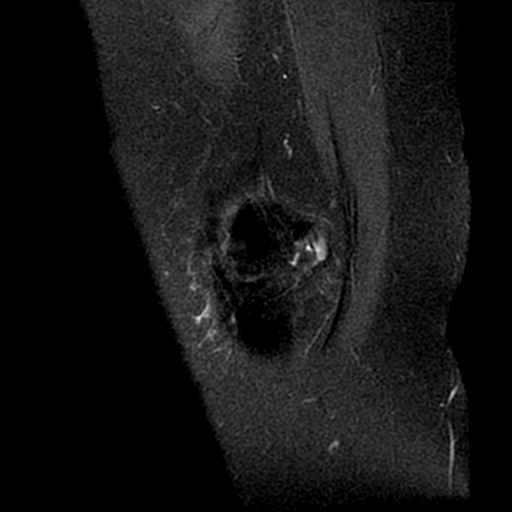
[im 10/29]
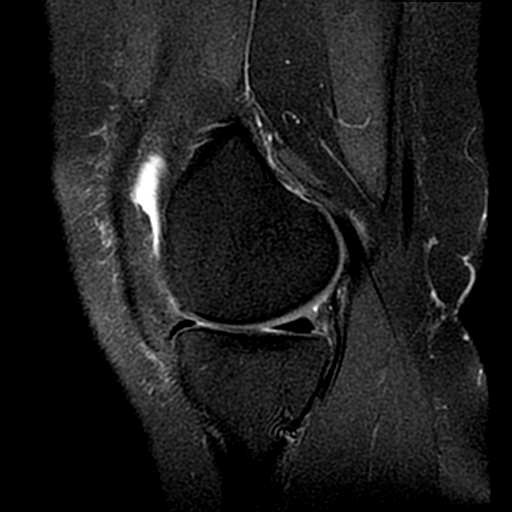
[im 15/29]
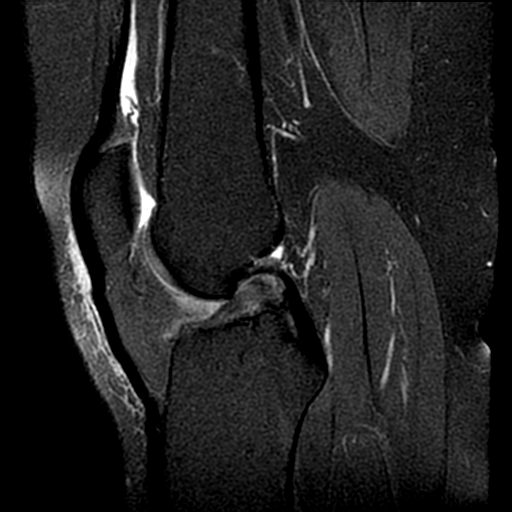
[im 19/29]
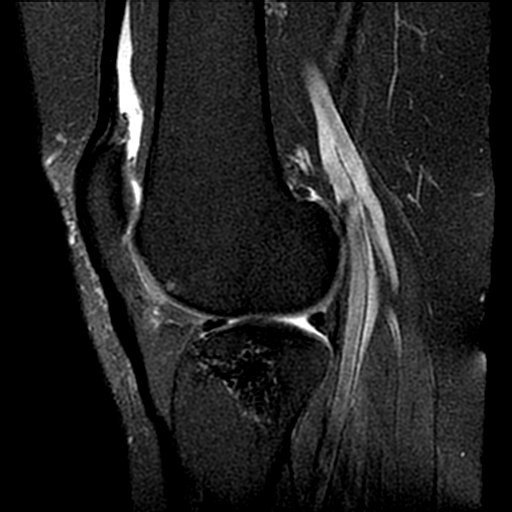
[im 24/29]
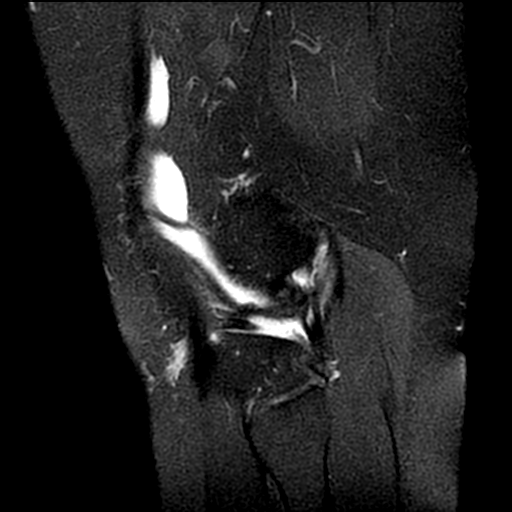
[im 29/29]
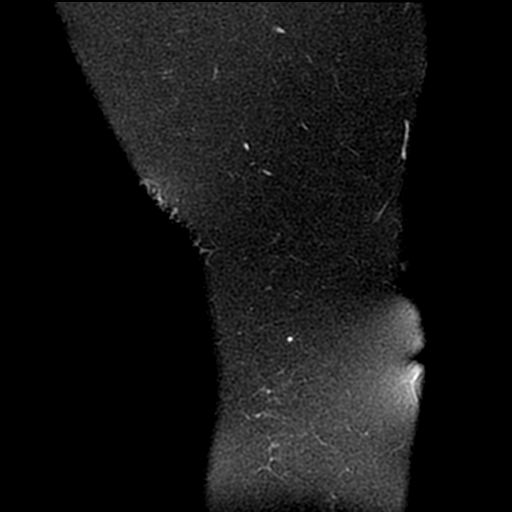

[Series 8: PD fat-sat · coronal · 2.0mm · 0.59mm/px · 3 of 12 slices shown (3 of 3)]
[im 1/12]
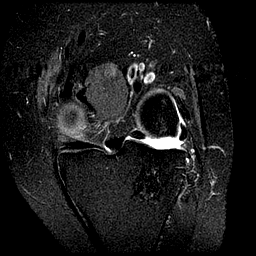
[im 6/12]
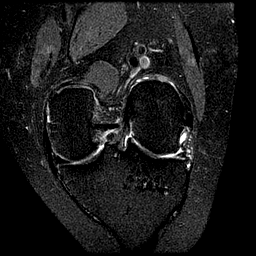
[im 12/12]
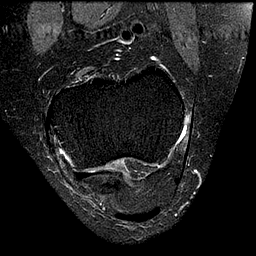

[25 of 40 positions shown; findings below may reference images not displayed]

FINDINGS: MENISCI

Medial meniscus: Complex signal along the posterior body of medial
meniscus with mild undersurface irregularity likely reflecting prior
meniscal repair.

Lateral meniscus: Prior meniscectomy of the posterior horn and body
of the lateral meniscus.

LIGAMENTS

Cruciates:  Intact ACL and PCL.

Collaterals: Medial collateral ligament is intact. Lateral
collateral ligament complex is intact.

CARTILAGE

Patellofemoral: Partial-thickness cartilage loss of the lateral
trochlea.

Medial:  No chondral defect.

Lateral: Extensive full-thickness cartilage loss of the lateral
femoral condyle and lateral tibial plateau.

Joint: Moderate joint effusion. Normal Hoffa's fat. No plical
thickening.

Popliteal Fossa:  No Baker cyst. Intact popliteus tendon.

Extensor Mechanism: Intact quadriceps tendon. Intact patellar
tendon. Intact medial patellar retinaculum. Intact lateral patellar
retinaculum. Intact MPFL.

Bones: No acute osseous abnormality. No aggressive osseous lesion.
Prior subchondroplasty in the lateral tibial plateau.

Other: No fluid collection or hematoma.  Muscles are normal.
IMPRESSION: 1. Complex signal along the posterior body of medial meniscus with
mild undersurface irregularity likely reflecting prior meniscal
repair.
2. Prior meniscectomy of the posterior horn and body of the lateral
meniscus.
3. Partial-thickness cartilage loss of the lateral trochlea.
4. Extensive full-thickness cartilage loss of the lateral femoral
condyle and lateral tibial plateau.

## 2020-01-02 ENCOUNTER — Other Ambulatory Visit: Payer: Self-pay | Admitting: Rheumatology

## 2020-01-16 ENCOUNTER — Other Ambulatory Visit: Payer: Self-pay | Admitting: Rheumatology

## 2020-01-18 NOTE — Progress Notes (Signed)
Office Visit Note  Patient: Connie Brady             Date of Birth: 11-20-1970           MRN: [id number]             PCP: Kelton Pillar, MD Referring: Kelton Pillar, MD Visit Date: 01/31/2020 Occupation: @GUAROCC @  Subjective:  Medication monitoring   History of Present Illness: Connie Brady is a 49 y.o. female with history of polymyositis, Sjogren's, osteoarthritis, degenerative disc disease and osteopenia.  She states she is not having any increased muscle weakness or tenderness.  She continues to have sicca symptoms which are manageable with over-the-counter medications.  Her left total knee replacement continues to bother her.  She has been also having some discomfort in her shoulders at times and also the right trochanteric region.  Patient complains of intermittent shortness of breath.  She does exercise on a regular basis.  She states she seen a pulmonologist in the past.  Activities of Daily Living:  Patient reports morning stiffness for  1 hour.   Patient Reports nocturnal pain.  Difficulty dressing/grooming: Denies Difficulty climbing stairs: Denies Difficulty getting out of chair: Denies Difficulty using hands for taps, buttons, cutlery, and/or writing: Denies  Review of Systems  Constitutional: Negative for fatigue.  HENT: Positive for mouth dryness. Negative for mouth sores and nose dryness.   Eyes: Positive for visual disturbance and dryness. Negative for pain and itching.  Respiratory: Positive for shortness of breath. Negative for difficulty breathing.   Cardiovascular: Negative for chest pain, palpitations and swelling in legs/feet.  Gastrointestinal: Negative for blood in stool, constipation and diarrhea.  Endocrine: Negative for increased urination.  Genitourinary: Negative for difficulty urinating.  Musculoskeletal: Positive for arthralgias, joint pain, myalgias, morning stiffness, muscle tenderness and myalgias. Negative for joint swelling and  muscle weakness.  Skin: Negative for color change, rash and redness.  Allergic/Immunologic: Negative for susceptible to infections.  Neurological: Positive for headaches. Negative for dizziness, numbness, memory loss and weakness.  Hematological: Negative for bruising/bleeding tendency.  Psychiatric/Behavioral: Positive for depressed mood and sleep disturbance. Negative for confusion. The patient is not nervous/anxious.     PMFS History:  Patient Active Problem List   Diagnosis Date Noted  . Osteoarthritis of left knee 04/07/2019  . Left lateral knee pain 08/24/2017  . S/P arthroscopic surgery of left knee 08/24/2017  . Abnormal cervical Papanicolaou smear 08/31/2016  . Obesity 08/31/2016  . Premenstrual symptom 08/31/2016  . Vitamin D deficiency 08/25/2016  . History of chronic kidney disease 05/07/2016  . Chronic kidney disease (CKD), stage III (moderate) ([place]) 03/30/2016  . High risk medication use 01/10/2016  . Primary osteoarthritis of both hips 01/10/2016  . Primary osteoarthritis of both knees 01/10/2016  . Primary osteoarthritis of both feet 01/10/2016  . Bilateral plantar fasciitis 01/10/2016  . Chest pain 01/26/2013  . Polymyositis ([place]) 01/26/2013  . Sjoegren syndrome     Past Medical History:  Diagnosis Date  . Anemia   . Chronic kidney disease    stage 3  . GERD (gastroesophageal reflux disease)   . 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 Sjoegren syndrome     Family History  Problem Relation Age of Onset  . Hypertension Sister   . Hypertension Brother   . Diabetes Brother   . Hypertension Sister   . Hypertension Sister   . Hypertension Brother   . Diabetes Brother    Past Surgical  History:  Procedure Laterality Date  . BUNIONECTOMY Bilateral   . KNEE ARTHROPLASTY Left 2018   meniscal tear  . KNEE ARTHROSCOPY WITH LATERAL MENISECTOMY Left 08/24/2017   Procedure: LEFT KNEE ARTHROSCOPY WITH LATERAL MENISECTOMY, CHONDROPLASTY, ARTHROSCOPIC  ASSISTED INTERNAL FIXATION LATERAL TIBIAL PLATEAU;  Surgeon: Sydnee Cabal, MD;  Location: [facility];  Service: Orthopedics;  Laterality: Left;  . KNEE SURGERY     right  . MUSCLE BIOPSY     r/thigh  . PLANTAR FASCIA SURGERY Right   . TOTAL KNEE ARTHROPLASTY Left 04/07/2019   Procedure: TOTAL KNEE ARTHROPLASTY;  Surgeon: Sydnee Cabal, MD;  Location: [facility];  Service: Orthopedics;  Laterality: Left;  adductor canal  . TOTAL SHOULDER ARTHROPLASTY Left 01/12/2017   Social History   Social History Narrative  . Not on file   Immunization History  Administered Date(s) Administered  . PFIZER SARS-COV-2 Vaccination 08/17/2019, 09/07/2019     Objective: Vital Signs: BP 116/79 (BP Location: Left Arm, Patient Position: Sitting, Cuff Size: Normal)   Pulse 86   Resp 17   Ht 5\' 8"  (1.727 m)   Wt 216 lb 6.4 oz (98.2 kg)   BMI 32.90 kg/m    Physical Exam Vitals and nursing note reviewed.  Constitutional:      Appearance: She is well-developed and well-nourished.  HENT:     Head: Normocephalic and atraumatic.  Eyes:     Extraocular Movements: EOM normal.     Conjunctiva/sclera: Conjunctivae normal.  Cardiovascular:     Rate and Rhythm: Normal rate and regular rhythm.     Pulses: Intact distal pulses.     Heart sounds: Normal heart sounds.  Pulmonary:     Effort: Pulmonary effort is normal.     Breath sounds: Normal breath sounds.  Abdominal:     General: Bowel sounds are normal.     Palpations: Abdomen is soft.  Musculoskeletal:     Cervical back: Normal range of motion.  Lymphadenopathy:     Cervical: No cervical adenopathy.  Skin:    General: Skin is warm and dry.     Capillary Refill: Capillary refill takes less than 2 seconds.  Neurological:     Mental Status: She is alert and oriented to person, place, and time.  Psychiatric:        Mood and Affect: Mood and affect normal.        Behavior: Behavior normal.      Musculoskeletal Exam: C-spine was  in good range of motion.  Shoulder joints, elbow joints, wrist joints, MCPs PIPs and DIPs with good range of motion with no synovitis.  Left knee joint is replaced without any discomfort on range of motion.  Right knee joint with good range of motion.  There is no tenderness over ankles or MTPs.  She had no difficulty getting up from the chair.  She had good muscle strength in upper and lower extremities.  CDAI Exam: CDAI Score: -- Patient Global: --; Provider Global: -- Swollen: --; Tender: -- Joint Exam 01/31/2020   No joint exam has been documented for this visit   There is currently no information documented on the homunculus. Go to the Rheumatology activity and complete the homunculus joint exam.  Investigation: No additional findings.  Imaging: No results found.  Recent Labs: Lab Results  Component Value Date   WBC 6.2 11/28/2019   HGB 12.6 11/28/2019   PLT 242 11/28/2019   NA 140 11/28/2019   K 4.0 11/28/2019   CL 108  11/28/2019   CO2 25 11/28/2019   GLUCOSE 96 11/28/2019   BUN 15 11/28/2019   CREATININE 1.23 (H) 11/28/2019   BILITOT 0.5 11/28/2019   ALKPHOS 57 04/04/2019   AST 12 11/28/2019   ALT 6 11/28/2019   PROT 6.6 11/28/2019   ALBUMIN 3.7 04/04/2019   CALCIUM 8.9 11/28/2019   GFRAA 59 (L) 11/28/2019    Speciality Comments: Fosamax to start date December 17, 2019  Procedures:  No procedures performed Allergies: Lyrica [pregabalin] and Percocet [oxycodone-acetaminophen]   Assessment / Plan:     Visit Diagnoses: Polymyositis ([place]) - History of elevated CK, positive muscle biopsy, myositis panel negative, initial CK 522: She is clinically doing well without any increased muscle weakness or tenderness.  She wants to decrease prednisone to 5 mg p.o. daily.  I was in agreement.  She will decrease prednisone to 5 mg p.o. daily.  She will have repeat labs in a month.  High risk medication use - Imuran 50 mg 1 tablet twice daily and prednisone 6 mg daily.  CBC,  CMP and CK have been stable.  Her next labs will be in January and then every 3 months to monitor for drug toxicity.  Sjogren's syndrome with keratoconjunctivitis sicca (H[facility]) - ANA 1:40 centromere, +SSA: pilocarpine 5 mg TID PRN.  She continues to have some sicca symptoms which are manageable with medications.  On prednisone therapy - Prednisone 6 mg by mouth daily.  Osteopenia of multiple sites - Fosamax started on December 17, 2019.Marland Kitchen DEXA updated on 11/16/19: Left femoral neck BMD 0.759 with T-score -1.3. Negative 5% change in BMD of right total femur noted.  Primary osteoarthritis of right knee-she has chronic discomfort.  S/P total knee arthroplasty, left-she continues to have pain in her left knee.  Primary osteoarthritis of both hips-she has discomfort off and on.  Primary osteoarthritis of both feet-currently has no pain and no tenderness on examination.  DDD (degenerative disc disease), lumbar-she has chronic lower back pain.  Shortness of breath-she gives history of intermittent shortness of breath.  She states she seen a pulmonologist in the past.  I will send a referral.  History of vitamin D deficiency  History of chronic kidney disease - She is followed by a nephrologist.  Her GFR is a stable.  She is fully vaccinated against COVID-19 and received a booster.  Use of mask, social distancing and hand hygiene was discussed.  Orders: No orders of the defined types were placed in this encounter.  No orders of the defined types were placed in this encounter.     Follow-Up Instructions: Return in about 3 months (around 04/30/2020) for Polymyositis, Osteoarthritis, Sjogren's.   Bo Merino, MD  Note - This record has been created using Editor, commissioning.  Chart creation errors have been sought, but may not always  have been located. Such creation errors do not reflect on  the standard of medical care.

## 2020-01-28 ENCOUNTER — Other Ambulatory Visit: Payer: Self-pay | Admitting: Rheumatology

## 2020-01-29 NOTE — Telephone Encounter (Signed)
Last Visit: 11/28/2019 Next Visit: 01/31/2020  Okay to refill per Dr. Estanislado Pandy

## 2020-01-31 ENCOUNTER — Encounter: Payer: Self-pay | Admitting: Rheumatology

## 2020-01-31 ENCOUNTER — Ambulatory Visit (INDEPENDENT_AMBULATORY_CARE_PROVIDER_SITE_OTHER): Payer: BC Managed Care – PPO | Admitting: Rheumatology

## 2020-01-31 ENCOUNTER — Other Ambulatory Visit: Payer: Self-pay

## 2020-01-31 VITALS — BP 116/79 | HR 86 | Resp 17 | Ht 68.0 in | Wt 216.4 lb

## 2020-01-31 DIAGNOSIS — M160 Bilateral primary osteoarthritis of hip: Secondary | ICD-10-CM

## 2020-01-31 DIAGNOSIS — Z87448 Personal history of other diseases of urinary system: Secondary | ICD-10-CM

## 2020-01-31 DIAGNOSIS — Z96652 Presence of left artificial knee joint: Secondary | ICD-10-CM

## 2020-01-31 DIAGNOSIS — Z7952 Long term (current) use of systemic steroids: Secondary | ICD-10-CM

## 2020-01-31 DIAGNOSIS — Z79899 Other long term (current) drug therapy: Secondary | ICD-10-CM

## 2020-01-31 DIAGNOSIS — M19072 Primary osteoarthritis, left ankle and foot: Secondary | ICD-10-CM

## 2020-01-31 DIAGNOSIS — M3501 Sicca syndrome with keratoconjunctivitis: Secondary | ICD-10-CM

## 2020-01-31 DIAGNOSIS — Z8639 Personal history of other endocrine, nutritional and metabolic disease: Secondary | ICD-10-CM

## 2020-01-31 DIAGNOSIS — R0602 Shortness of breath: Secondary | ICD-10-CM

## 2020-01-31 DIAGNOSIS — M8589 Other specified disorders of bone density and structure, multiple sites: Secondary | ICD-10-CM

## 2020-01-31 DIAGNOSIS — M5136 Other intervertebral disc degeneration, lumbar region: Secondary | ICD-10-CM

## 2020-01-31 DIAGNOSIS — M1711 Unilateral primary osteoarthritis, right knee: Secondary | ICD-10-CM

## 2020-01-31 DIAGNOSIS — M3320 Polymyositis, organ involvement unspecified: Secondary | ICD-10-CM | POA: Diagnosis not present

## 2020-01-31 DIAGNOSIS — M19071 Primary osteoarthritis, right ankle and foot: Secondary | ICD-10-CM

## 2020-01-31 NOTE — Patient Instructions (Signed)
Standing Labs We placed an order today for your standing lab work.   Please have your standing labs drawn in January and every 3 months ( including CK)  If possible, please have your labs drawn 2 weeks prior to your appointment so that the provider can discuss your results at your appointment.  We have open lab daily Monday through Thursday from 8:30-12:30 PM and 1:30-4:30 PM and Friday from 8:30-12:30 PM and 1:30-4:00 PM at the office of Dr. Bo Merino, [facility].   Please be advised, patients with office appointments requiring lab work will take precedents over walk-in lab work.  If possible, please come for your lab work on Monday and Friday afternoons, as you may experience shorter wait times. The office is located at [address], [place], [place], [place] [address] No appointment is necessary.   Labs are drawn by [facility]. Please bring your co-pay at the time of your lab draw.  You may receive a bill from [facility] for your lab work.  If you wish to have your labs drawn at another location, please call the office 24 hours in advance to send orders.  If you have any questions regarding directions or hours of operation,  please call [phone].   As a reminder, please drink plenty of water prior to coming for your lab work. Thanks!

## 2020-02-08 ENCOUNTER — Other Ambulatory Visit: Payer: Self-pay | Admitting: Physician Assistant

## 2020-02-08 NOTE — Telephone Encounter (Signed)
Last Visit: 12/15/021 Next Visit: 04/30/2020 Labs: 11/28/2019 Absolute eosinophils are 0-stable. WBC count is WNL. Rest of CBC WNL. Creatinine remains elevated-1.23 and GFR has improved to 59  Current Dose per office note 01/31/2020: Imuran 50 mg 1 tablet twice daily DX: Polymyositis  Okay to refill Imuran?

## 2020-04-15 ENCOUNTER — Other Ambulatory Visit: Payer: Self-pay | Admitting: Physician Assistant

## 2020-04-15 NOTE — Telephone Encounter (Signed)
Last Visit: 12/15/021 Next Visit: 04/30/2020 Labs: 11/28/2019 Absolute eosinophils are 0-stable. WBC count is WNL. Rest of CBC WNL. Creatinine remains elevated-1.23 and GFR has improved to 59  Current Dose per office note 01/31/2020: Fosamax started on December 17, 2019 Dose not mentioned.  DX: Osteopenia of multiple sites   Okay to refill Fosamax?

## 2020-04-16 NOTE — Progress Notes (Signed)
Office Visit Note  Patient: Connie Brady             Date of Birth: June 01, 1970           MRN: [id number]             PCP: Kelton Pillar, MD Referring: Kelton Pillar, MD Visit Date: 04/30/2020 Occupation: @GUAROCC @  Subjective:  Pain in both legs   History of Present Illness: Connie Brady is a 50 y.o. female with history of polymyositis.  Patient is taking Imuran 50 mg 2 tablets daily and prednisone 5 mg by mouth daily.  According to the patient she reduce the dose of prednisone from 6 mg to 5 mg by mouth daily after her last office visit in December 2021.  She states since then she has been experiencing increased pain in both legs.  She has not noticed any increased muscle weakness.  She continues to exercise 3-4 times per week.  She has not noticed that the exercise makes her pain better or worse.  She states that she has occasional cramping and tightness in her calfs bilaterally.  She has ongoing discomfort due to piriformis syndrome of the right hip.  She continues to have persistent pain in the left knee which was replaced.  She denies any neck or lower back pain currently.  She denies any joint swelling. Patient reports that her eye dryness has been well controlled using Systane on a daily basis.  She continues to take pilocarpine 5 mg 1 tablet by mouth daily. She denies any recent falls or fractures.  She takes Fosamax 70 mg 1 tablet by mouth rinse weekly.  She is been tolerating Fosamax without any side effects but occasionally misses doses.  She has not been taking a vitamin D supplement but plans to restart.  Activities of Daily Living:  Patient reports morning stiffness for all  Day.   Patient Reports nocturnal pain.  Difficulty dressing/grooming: Denies Difficulty climbing stairs: Reports Difficulty getting out of chair: Reports Difficulty using hands for taps, buttons, cutlery, and/or writing: Denies  Review of Systems  Constitutional: Positive for fatigue.   HENT: Negative for mouth sores, mouth dryness and nose dryness.   Eyes: Negative for pain, itching and dryness.  Respiratory: Negative for shortness of breath and difficulty breathing.   Cardiovascular: Negative for chest pain and palpitations.  Gastrointestinal: Negative for blood in stool, constipation and diarrhea.  Endocrine: Negative for increased urination.  Genitourinary: Negative for difficulty urinating.  Musculoskeletal: Positive for arthralgias, joint pain, joint swelling, myalgias, morning stiffness, muscle tenderness and myalgias.  Skin: Negative for color change, rash and redness.  Allergic/Immunologic: Negative for susceptible to infections.  Neurological: Positive for headaches. Negative for numbness and memory loss.  Hematological: Negative for bruising/bleeding tendency.  Psychiatric/Behavioral: Positive for sleep disturbance. Negative for confusion.    PMFS History:  Patient Active Problem List   Diagnosis Date Noted  . Osteoarthritis of left knee 04/07/2019  . Left lateral knee pain 08/24/2017  . S/P arthroscopic surgery of left knee 08/24/2017  . Abnormal cervical Papanicolaou smear 08/31/2016  . Obesity 08/31/2016  . Premenstrual symptom 08/31/2016  . Vitamin D deficiency 08/25/2016  . History of chronic kidney disease 05/07/2016  . Chronic kidney disease (CKD), stage III (moderate) ([place]) 03/30/2016  . High risk medication use 01/10/2016  . Primary osteoarthritis of both hips 01/10/2016  . Primary osteoarthritis of both knees 01/10/2016  . Primary osteoarthritis of both feet 01/10/2016  . Bilateral plantar fasciitis 01/10/2016  .  Chest pain 01/26/2013  . Polymyositis ([place]) 01/26/2013  . Sjoegren syndrome     Past Medical History:  Diagnosis Date  . Anemia   . Chronic kidney disease    stage 3  . GERD (gastroesophageal reflux disease)   . Polymyositis ([place])    also neuropathy of feet  . Sjoegren syndrome     Family History  Problem Relation Age of  Onset  . Hypertension Sister   . Hypertension Brother   . Diabetes Brother   . Hypertension Sister   . Hypertension Sister   . Hypertension Brother   . Diabetes Brother    Past Surgical History:  Procedure Laterality Date  . BUNIONECTOMY Bilateral   . KNEE ARTHROPLASTY Left 2018   meniscal tear  . KNEE ARTHROSCOPY WITH LATERAL MENISECTOMY Left 08/24/2017   Procedure: LEFT KNEE ARTHROSCOPY WITH LATERAL MENISECTOMY, CHONDROPLASTY, ARTHROSCOPIC ASSISTED INTERNAL FIXATION LATERAL TIBIAL PLATEAU;  Surgeon: Sydnee Cabal, MD;  Location: [facility];  Service: Orthopedics;  Laterality: Left;  . KNEE SURGERY     right  . MUSCLE BIOPSY     r/thigh  . PLANTAR FASCIA SURGERY Right   . TOTAL KNEE ARTHROPLASTY Left 04/07/2019   Procedure: TOTAL KNEE ARTHROPLASTY;  Surgeon: Sydnee Cabal, MD;  Location: [facility];  Service: Orthopedics;  Laterality: Left;  adductor canal  . TOTAL SHOULDER ARTHROPLASTY Left 01/12/2017   Social History   Social History Narrative  . Not on file   Immunization History  Administered Date(s) Administered  . PFIZER(Purple Top)SARS-COV-2 Vaccination 08/17/2019, 09/07/2019     Objective: Vital Signs: BP 115/78 (BP Location: Left Arm, Patient Position: Sitting, Cuff Size: Normal)   Pulse 82   Resp 16   Ht 5\' 8"  (1.727 m)   Wt 214 lb 3.2 oz (97.2 kg)   BMI 32.57 kg/m    Physical Exam Vitals and nursing note reviewed.  Constitutional:      Appearance: She is well-developed.  HENT:     Head: Normocephalic and atraumatic.  Eyes:     Conjunctiva/sclera: Conjunctivae normal.  Pulmonary:     Effort: Pulmonary effort is normal.  Abdominal:     Palpations: Abdomen is soft.  Musculoskeletal:     Cervical back: Normal range of motion.  Skin:    General: Skin is warm and dry.     Capillary Refill: Capillary refill takes less than 2 seconds.  Neurological:     Mental Status: She is alert and oriented to person, place, and time.  Psychiatric:         Behavior: Behavior normal.      Musculoskeletal Exam: C-spine, thoracic spine, lumbar spine have good range of motion with no discomfort.  Shoulder joints, elbow joints, wrist joints, MCPs, PIPs, DIPs have good range of motion with no synovitis.  She is able to make a complete fist bilaterally.  Hip joints have good range of motion with no discomfort.  Tenderness over bilateral trochanteric bursa.  Tenderness palpation over the piriformis muscle of the right hip.  Left knee replacement has slightly limited extension with warmth.  Right knee has good range of motion with no warmth or effusion.  Ankle joints have good range of motion with no tenderness or inflammation.    CDAI Exam: CDAI Score: -- Patient Global: --; Provider Global: -- Swollen: --; Tender: -- Joint Exam 04/30/2020   No joint exam has been documented for this visit   There is currently no information documented on the homunculus. Go to the Rheumatology  activity and complete the homunculus joint exam.  Investigation: No additional findings.  Imaging: No results found.  Recent Labs: Lab Results  Component Value Date   WBC 6.2 11/28/2019   HGB 12.6 11/28/2019   PLT 242 11/28/2019   NA 140 11/28/2019   K 4.0 11/28/2019   CL 108 11/28/2019   CO2 25 11/28/2019   GLUCOSE 96 11/28/2019   BUN 15 11/28/2019   CREATININE 1.23 (H) 11/28/2019   BILITOT 0.5 11/28/2019   ALKPHOS 57 04/04/2019   AST 12 11/28/2019   ALT 6 11/28/2019   PROT 6.6 11/28/2019   ALBUMIN 3.7 04/04/2019   CALCIUM 8.9 11/28/2019   GFRAA 59 (L) 11/28/2019    Speciality Comments: Fosamax to start date December 17, 2019  Procedures:  No procedures performed Allergies: Lyrica [pregabalin] and Percocet [oxycodone-acetaminophen]   Assessment / Plan:     Visit Diagnoses: Polymyositis ([place]) - History of elevated CK, positive muscle biopsy, myositis panel negative, initial CK 522: She has not had any signs or symptoms of a flare recently.  She is  currently taking Imuran 50 mg 1 tablet by mouth twice daily and prednisone 5 mg 1 tablet by mouth daily.  According to the patient she reduced prednisone from 6 mg to 5 mg after her last office visit in December 2021.  She has noticed some increased myalgias and muscle tension in bilateral lower extremities since reducing the dose of prednisone.  She has full strength of upper and lower extremities on examination today.  She continues to exercise 3-4 times per week without difficulty.  She has been experiencing intermittent cramping and tightness in bilateral calf muscles.  We discussed starting to take magnesium malate at bedtime starting at 250 mg and if she tolerates that she can increase to 500 mg.  We also discussed the list of natural anti-inflammatories in detail and all questions were addressed.  She will continue taking Imuran and prednisone as prescribed.  We will check CK, CBC, and CMP today.  She was advised to notify us if she develops any signs or symptoms of a flare.  She will follow-up in the office in 3 months.- Plan: CK  High risk medication use - Imuran 50 mg 1 tablet by mouth twice daily and prednisone 5 mg 1 tablet by mouth daily.  CBC and CMP were drawn on 11/28/2019.  She is overdue to update lab work.  Orders for CBC and CMP were released.  Her next lab work will be due in June and every 3 months to monitor for drug toxicity.  Standing orders for CBC and CMP remain in place.  She will continue to require CK level every 3 months.- Plan: CK, COMPLETE METABOLIC PANEL WITH GFR, CBC with Differential/Platelet  Sjogren's syndrome with keratoconjunctivitis sicca ([facility]) - ANA 1:40 centromere, +SSA: Her sicca symptoms have been very tolerable.  She continues to use Systane eyedrops on a daily basis and takes pilocarpine 5 mg 1 to 2 tablets by mouth daily as needed for symptomatic relief.  Her symptoms have been well controlled on the current treatment regimen.  On prednisone therapy -She is  taking Prednisone 5 mg by mouth daily.  Osteopenia of multiple sites - She was started on Fosamax on December 17, 2019.  She has been tolerating fosamax without any side effects. She occasionally misses the weekly dose of fosamax. DEXA updated on 11/16/19: Left femoral neck BMD 0.759 with T-score -1.3. Negative 5% change in BMD of right total femur. She remains  on long term systemic prednisone. She has not had any recent falls or fractures.   History of vitamin D deficiency: She was advised to take a maintenance dose of vitamin D 2,000 units daily.   Primary osteoarthritis of right knee: She has good ROM with no discomfort.  No warmth or effusion noted.  She was given a list of natural anti-inflammatories to start taking.  S/P total knee arthroplasty, left: Chronic pain.  She has slightly limited extension with warmth.  Primary osteoarthritis of both hips: She has good range of motion of both hip joints on examination today.  No groin pain at this time.  Piriformis syndrome of right side: She has tenderness palpation over the piriformis muscle on the right side.  She is given an informational handout about piriformis syndrome as well as stretching exercises to perform on a daily basis.  We also discussed the use of a foam roller.  Primary osteoarthritis of both feet: She has been experiencing intermittent pain in bilateral great toes.  She has been wearing proper fitting shoes.   DDD (degenerative disc disease), lumbar: She is not experiencing any lower back pain currently.   History of chronic kidney disease - She is followed by a nephrologist.  Orders: Orders Placed This Encounter  Procedures  . CK  . COMPLETE METABOLIC PANEL WITH GFR  . CBC with Differential/Platelet   Meds ordered this encounter  Medications  . predniSONE (DELTASONE) 5 MG tablet    Sig: Take 1 tablet (5 mg total) by mouth daily with breakfast.    Dispense:  90 tablet    Refill:  0      Follow-Up Instructions:  Return in about 3 months (around 07/31/2020) for Polymyositis.   Ofilia Neas, PA-C  Note - This record has been created using Dragon software.  Chart creation errors have been sought, but may not always  have been located. Such creation errors do not reflect on  the standard of medical care.

## 2020-04-30 ENCOUNTER — Ambulatory Visit: Payer: BC Managed Care – PPO | Admitting: Physician Assistant

## 2020-04-30 ENCOUNTER — Other Ambulatory Visit: Payer: Self-pay

## 2020-04-30 ENCOUNTER — Encounter: Payer: Self-pay | Admitting: Physician Assistant

## 2020-04-30 VITALS — BP 115/78 | HR 82 | Resp 16 | Ht 68.0 in | Wt 214.2 lb

## 2020-04-30 DIAGNOSIS — Z87448 Personal history of other diseases of urinary system: Secondary | ICD-10-CM

## 2020-04-30 DIAGNOSIS — M8589 Other specified disorders of bone density and structure, multiple sites: Secondary | ICD-10-CM

## 2020-04-30 DIAGNOSIS — M5136 Other intervertebral disc degeneration, lumbar region: Secondary | ICD-10-CM

## 2020-04-30 DIAGNOSIS — Z79899 Other long term (current) drug therapy: Secondary | ICD-10-CM | POA: Diagnosis not present

## 2020-04-30 DIAGNOSIS — M160 Bilateral primary osteoarthritis of hip: Secondary | ICD-10-CM

## 2020-04-30 DIAGNOSIS — M1711 Unilateral primary osteoarthritis, right knee: Secondary | ICD-10-CM

## 2020-04-30 DIAGNOSIS — M3320 Polymyositis, organ involvement unspecified: Secondary | ICD-10-CM

## 2020-04-30 DIAGNOSIS — M19072 Primary osteoarthritis, left ankle and foot: Secondary | ICD-10-CM

## 2020-04-30 DIAGNOSIS — M3501 Sicca syndrome with keratoconjunctivitis: Secondary | ICD-10-CM

## 2020-04-30 DIAGNOSIS — Z7952 Long term (current) use of systemic steroids: Secondary | ICD-10-CM | POA: Diagnosis not present

## 2020-04-30 DIAGNOSIS — G5701 Lesion of sciatic nerve, right lower limb: Secondary | ICD-10-CM

## 2020-04-30 DIAGNOSIS — M19071 Primary osteoarthritis, right ankle and foot: Secondary | ICD-10-CM

## 2020-04-30 DIAGNOSIS — Z96652 Presence of left artificial knee joint: Secondary | ICD-10-CM

## 2020-04-30 DIAGNOSIS — Z8639 Personal history of other endocrine, nutritional and metabolic disease: Secondary | ICD-10-CM

## 2020-04-30 DIAGNOSIS — R0602 Shortness of breath: Secondary | ICD-10-CM

## 2020-04-30 MED ORDER — PREDNISONE 5 MG PO TABS
5.0000 mg | ORAL_TABLET | Freq: Every day | ORAL | 0 refills | Status: DC
Start: 2020-04-30 — End: 2020-08-02

## 2020-04-30 NOTE — Patient Instructions (Signed)
Piriformis Syndrome  Piriformis syndrome is a condition that can cause pain and numbness in your buttocks and down the back of your leg. Piriformis syndrome happens when the small muscle that connects the base of your spine to your hip (piriformis muscle) presses on the nerve that runs down the back of your leg (sciatic nerve). The piriformis muscle helps your hip rotate and helps to bring your leg back and out. It also helps shift your weight to keep you stable while you are walking. The sciatic nerve runs under or through the piriformis muscle. Damage to the piriformis muscle can cause spasms that put pressure on the nerve below. This causes pain and discomfort while sitting and moving. The pain may feel as if it begins in the buttock and spreads (radiates) down your hip and thigh. What are the causes? This condition is caused by pressure on the sciatic nerve from the piriformis muscle. The piriformis muscle can get irritated with overuse, especially if other hip muscles are weak and the piriformis muscle has to do extra work. Piriformis syndrome can also occur after an injury, like a fall onto your buttocks. What increases the risk? You are more likely to develop this condition if you:  Are a woman.  Sit for long periods of time.  Are a cyclist.  Have weak buttocks muscles (gluteal muscles). What are the signs or symptoms? Symptoms of this condition include:  Pain, tingling, or numbness that starts in the buttock and runs down the back of your leg (sciatica).  Pain in the groin or thigh area. Your symptoms may get worse:  The longer you sit.  When you walk, run, or climb stairs.  When straining to have a bowel movement. How is this diagnosed? This condition is diagnosed based on your symptoms, medical history, and physical exam.  During the exam, your health care provider may: ? Move your leg into different positions to check for pain. ? Press on the muscles of your hip and  buttock to see if that increases your symptoms.  You may also have tests, including: ? Imaging tests such as X-rays, MRI, or ultrasound. ? Electromyogram (EMG). This test measures electrical signals sent by your nerves into the muscles. ? Nerve conduction study. This test measures how well electrical signals pass through your nerves. How is this treated? This condition may be treated by:  Stopping all activities that cause pain or make your condition worse.  Applying ice or using heat therapy.  Taking medicines to reduce pain and swelling.  Taking a muscle relaxer (muscle relaxant) to stop muscle spasms.  Doing range-of-motion and strengthening exercises (physical therapy) as told by your health care provider.  Massaging the area.  Having acupuncture.  Getting an injection of medicine in the piriformis muscle. Your health care provider will choose the medicine based on your condition. He or she may inject: ? An anti-inflammatory medicine (steroid) to reduce swelling. ? A numbing medicine (local anesthetic) to block the pain. ? Botulinum toxin. The toxin blocks nerve impulses to specific muscles to reduce muscle tension. In rare cases, you may need surgery to cut the muscle and release pressure on the nerve if other treatments do not work. Follow these instructions at home: Activity  Do not sit for long periods. Get up and walk around every 20 minutes or as often as told by your health care provider. ? When driving long distances, make sure to take frequent stops to get up and stretch.  Use a   cushion when you sit on hard surfaces.  Do exercises as told by your health care provider.  Return to your normal activities as told by your health care provider. Ask your health care provider what activities are safe for you. Managing pain, stiffness, and swelling  If directed, apply heat to the affected area as often as told by your health care provider. Use the heat source that your  health care provider recommends, such as a moist heat pack or a heating pad. ? Place a towel between your skin and the heat source. ? Leave the heat on for 20-30 minutes. ? Remove the heat if your skin turns bright red. This is especially important if you are unable to feel pain, heat, or cold. You may have a greater risk of getting burned.  If directed, put ice on the injured area. ? Put ice in a plastic bag. ? Place a towel between your skin and the bag. ? Leave the ice on for 20 minutes, 2-3 times a day.      General instructions  Take over-the-counter and prescription medicines only as told by your health care provider.  Ask your health care provider if the medicine prescribed to you requires you to avoid driving or using heavy machinery.  You may need to take actions to prevent or treat constipation, such as: ? Drink enough fluid to keep your urine pale yellow. ? Take over-the-counter or prescription medicines. ? Eat foods that are high in fiber, such as beans, whole grains, and fresh fruits and vegetables. ? Limit foods that are high in fat and processed sugars, such as fried or sweet foods.  Keep all follow-up visits as told by your health care provider. This is important. How is this prevented?  Do not sit for longer than 20 minutes at a time. When you sit, choose padded surfaces.  Warm up and stretch before being active.  Cool down and stretch after being active.  Give your body time to rest between periods of activity.  Make sure to use equipment that fits you.  Maintain physical fitness, including: ? Strength. ? Flexibility. Contact a health care provider if:  Your pain and stiffness continue or get worse.  Your leg or hip becomes weak.  You have changes in your bowel function or bladder function. Summary  Piriformis syndrome is a condition that can cause pain, tingling, and numbness in your buttocks and down the back of your leg.  You may try applying  heat or ice to relieve the pain.  Do not sit for long periods. Get up and walk around every 20 minutes or as often as told by your health care provider. This information is not intended to replace advice given to you by your health care provider. Make sure you discuss any questions you have with your health care provider. Document Revised: 05/26/2018 Document Reviewed: 09/29/2017 Elsevier Patient Education  2021 Elsevier Inc.   Piriformis Syndrome Rehab Ask your health care provider which exercises are safe for you. Do exercises exactly as told by your health care provider and adjust them as directed. It is normal to feel mild stretching, pulling, tightness, or discomfort as you do these exercises. Stop right away if you feel sudden pain or your pain gets worse. Do not begin these exercises until told by your health care provider. Stretching and range-of-motion exercises These exercises warm up your muscles and joints and improve the movement and flexibility of your hip and pelvis. The exercises also help   to relieve pain, numbness, and tingling. Hip rotation This is an exercise in which you lie on your back and stretch the muscles that rotate your hip (hip rotators) to stretch your buttocks. 1. Lie on your back on a firm surface. 2. Pull your left / right knee toward your same shoulder with your left / right hand until your knee is pointing toward the ceiling. Hold your left / right ankle with your other hand. 3. Keeping your knee steady, gently pull your left / right ankle toward your other shoulder until you feel a stretch in your buttocks. 4. Hold this position for __________ seconds. Repeat __________ times. Complete this exercise __________ times a day.   Hip extensor This is an exercise in which you lie on your back and pull your knee to your chest. 1. Lie on your back on a firm surface. Both of your legs should be straight. 2. Pull your left / right knee to your chest. Hold your leg in  this position by holding onto the back of your thigh or the front of your knee. 3. Hold this position for __________ seconds. 4. Slowly return to the starting position. Repeat __________ times. Complete this exercise __________ times a day. Strengthening exercises These exercises build strength and endurance in your hip and thigh muscles. Endurance is the ability to use your muscles for a long time, even after they get tired. Straight leg raises, side-lying This exercise strengthens the muscles that rotate the leg at the hip and move it away from your body (hip abductors). 1. Lie on your side with your left / right leg in the top position. Lie so your head, shoulder, knee, and hip line up. Bend your bottom knee to help you balance. 2. Lift your top leg 4-6 inches (10-15 cm) while keeping your toes pointed straight ahead. 3. Hold this position for __________ seconds. 4. Slowly lower your leg to the starting position. 5. Let your muscles relax completely after each repetition. Repeat __________ times. Complete this exercise __________ times a day.   Hip abduction and rotation This is sometimes called quadruped (on hands and knees) exercises. 1. Get on your hands and knees on a firm, lightly padded surface. Your hands should be directly below your shoulders, and your knees should be directly below your hips. 2. Lift your left / right knee out to the side. Keep your knee bent. Do not twist your body. 3. Hold this position for __________ seconds. 4. Slowly lower your leg. Repeat __________ times. Complete this exercise __________ times a day.   Straight leg raises, face-down This exercise stretches the muscles that move your hips away from the front of the pelvis (hip extensors). 1. Lie on your abdomen on a bed or a firm surface with a pillow under your hips. 2. Squeeze your buttocks muscles and lift your left / right leg about 4-6 inches (10-15 cm) off the bed. Do not let your back arch. 3. Hold  this position for __________ seconds. 4. Slowly lower your leg to the starting position. 5. Let your muscles relax completely after each repetition. Repeat __________ times. Complete this exercise __________ times a day. This information is not intended to replace advice given to you by your health care provider. Make sure you discuss any questions you have with your health care provider. Document Revised: 05/26/2018 Document Reviewed: 11/25/2017 Elsevier Patient Education  2021 Elsevier Inc.  

## 2020-05-01 LAB — CBC WITH DIFFERENTIAL/PLATELET
Absolute Monocytes: 428 cells/uL (ref 200–950)
Basophils Absolute: 19 cells/uL (ref 0–200)
Basophils Relative: 0.3 %
Eosinophils Absolute: 12 cells/uL — ABNORMAL LOW (ref 15–500)
Eosinophils Relative: 0.2 %
HCT: 38.6 % (ref 35.0–45.0)
Hemoglobin: 12.6 g/dL (ref 11.7–15.5)
Lymphs Abs: 1073 cells/uL (ref 850–3900)
MCH: 28.6 pg (ref 27.0–33.0)
MCHC: 32.6 g/dL (ref 32.0–36.0)
MCV: 87.5 fL (ref 80.0–100.0)
MPV: 10.3 fL (ref 7.5–12.5)
Monocytes Relative: 6.9 %
Neutro Abs: 4669 cells/uL (ref 1500–7800)
Neutrophils Relative %: 75.3 %
Platelets: 224 10*3/uL (ref 140–400)
RBC: 4.41 10*6/uL (ref 3.80–5.10)
RDW: 12.8 % (ref 11.0–15.0)
Total Lymphocyte: 17.3 %
WBC: 6.2 10*3/uL (ref 3.8–10.8)

## 2020-05-01 LAB — COMPLETE METABOLIC PANEL WITH GFR
AG Ratio: 1.4 (calc) (ref 1.0–2.5)
ALT: 11 U/L (ref 6–29)
AST: 16 U/L (ref 10–35)
Albumin: 3.7 g/dL (ref 3.6–5.1)
Alkaline phosphatase (APISO): 58 U/L (ref 37–153)
BUN/Creatinine Ratio: 9 (calc) (ref 6–22)
BUN: 13 mg/dL (ref 7–25)
CO2: 26 mmol/L (ref 20–32)
Calcium: 9.3 mg/dL (ref 8.6–10.4)
Chloride: 106 mmol/L (ref 98–110)
Creat: 1.41 mg/dL — ABNORMAL HIGH (ref 0.50–1.05)
GFR, Est African American: 50 mL/min/{1.73_m2} — ABNORMAL LOW (ref >=60)
GFR, Est Non African American: 43 mL/min/{1.73_m2} — ABNORMAL LOW (ref >=60)
Globulin: 2.7 g/dL (calc) (ref 1.9–3.7)
Glucose, Bld: 93 mg/dL (ref 65–99)
Potassium: 4.1 mmol/L (ref 3.5–5.3)
Sodium: 139 mmol/L (ref 135–146)
Total Bilirubin: 0.5 mg/dL (ref 0.2–1.2)
Total Protein: 6.4 g/dL (ref 6.1–8.1)

## 2020-05-01 LAB — CK: Total CK: 216 U/L — ABNORMAL HIGH (ref 29–143)

## 2020-05-01 NOTE — Progress Notes (Signed)
Discussed lab work with Dr. Estanislado Pandy.  CK is slightly elevated.  Please advise the patient to increase prednisone back to 6 mg daily.  Recheck CK in 1 month.   Creatinine remains elevated and Gfr is low at 50.  Please advise the patient to avoid taking NSAIDs. Please forward lab work to PCP.   Eosinophils are slightly low but have improved.  WBC count WNL. Rest of CBC WNL.  We will continue to monitor.

## 2020-05-02 ENCOUNTER — Other Ambulatory Visit: Payer: Self-pay | Admitting: *Deleted

## 2020-05-02 MED ORDER — PREDNISONE 1 MG PO TABS: 1.0000 mg | ORAL_TABLET | Freq: Every day | ORAL | 0 refills | Status: DC

## 2020-05-02 NOTE — Addendum Note (Signed)
Addended by: Shona Needles on: 05/02/2020 12:01 PM   Modules accepted: Orders

## 2020-05-03 ENCOUNTER — Telehealth: Payer: Self-pay

## 2020-05-03 NOTE — Telephone Encounter (Signed)
Patient left a voicemail stating she has been working out 4 days/week and has a couple of questions regarding her exercise routine.  Patient requested a return call.

## 2020-05-03 NOTE — Telephone Encounter (Signed)
Attempted to contact patient and left message on machine advising patient to call the office.

## 2020-05-07 ENCOUNTER — Other Ambulatory Visit: Payer: Self-pay | Admitting: Rheumatology

## 2020-05-07 NOTE — Telephone Encounter (Signed)
Next Visit: 08/02/2020  Last Visit: 04/30/2020  Last Fill: 02/08/2020  DX: Polymyositis  Current Dose per office note 04/30/2020, Imuran 50 mg 1 tablet by mouth twice daily  Labs: 04/30/2020, Discussed lab work with Dr. Estanislado Pandy. CK is slightly elevated. Please advise the patient to increase prednisone back to 6 mg daily. Recheck CK in 1 month.   Creatinine remains elevated and Gfr is low at 50. Please advise the patient to avoid taking NSAIDs. Please forward lab work to PCP.   Eosinophils are slightly low but have improved. WBC count WNL. Rest of CBC WNL. We will continue to monitor.   Okay to refill Imuran?

## 2020-05-27 ENCOUNTER — Other Ambulatory Visit: Payer: Self-pay | Admitting: Rheumatology

## 2020-05-27 NOTE — Telephone Encounter (Signed)
Next Visit: 08/02/2020  Last Visit: 04/30/2020  Last Fill: 01/29/2020  Dx: Sjogren's syndrome with keratoconjunctivitis sicca   Current Dose per office note on 04/30/2020, pilocarpine 5 mg 1 to 2 tablets by mouth daily as needed for symptomatic relief  Okay to refill pilocarpine?

## 2020-07-19 NOTE — Progress Notes (Signed)
Office Visit Note  Patient: Connie Brady             Date of Birth: 06-05-1970           MRN: [id number]             PCP: Kelton Pillar, MD Referring: Kelton Pillar, MD Visit Date: 08/02/2020 Occupation: @GUAROCC @  Subjective:  Right shoulder pain.   History of Present Illness: Connie Brady is a 50 y.o. female with history of polymyositis, Sjogren's and osteoarthritis.  She states she had third COVID-19 vaccination about a month ago in her right arm.  She has been having right arm and shoulder pain since then.  She denies any increased muscle weakness or tenderness.  She has been taking her medications on a regular basis.  She was having some left knee joint pain for which she was seen by her orthopedic surgeon.  She had left knee replacement in February 2021.  He increased prednisone dose to 10 mg p.o.daily for 10 days and the increased dose of prednisone improved her symptoms.  Although she still have some discomfort.  Today is the last day of the 10 mg dosage.  She continues to have dry mouth symptoms.  She also has reflux symptoms.  Activities of Daily Living:  Patient reports morning stiffness for all day. Patient Reports nocturnal pain.  Difficulty dressing/grooming: Denies Difficulty climbing stairs: Denies Difficulty getting out of chair: Denies Difficulty using hands for taps, buttons, cutlery, and/or writing: Denies  Review of Systems  Constitutional:  Negative for fatigue.  HENT:  Positive for mouth dryness. Negative for mouth sores and nose dryness.   Eyes:  Negative for pain, itching and dryness.  Respiratory:  Negative for shortness of breath and difficulty breathing.   Cardiovascular:  Positive for chest pain and palpitations.  Gastrointestinal:  Negative for blood in stool, constipation and diarrhea.  Endocrine: Negative for increased urination.  Genitourinary:  Negative for difficulty urinating.  Musculoskeletal:  Positive for joint pain, joint pain,  myalgias, morning stiffness, muscle tenderness and myalgias. Negative for joint swelling.  Skin:  Negative for color change, rash, hair loss and redness.  Allergic/Immunologic: Negative for susceptible to infections.  Neurological:  Negative for dizziness, numbness, headaches, memory loss and weakness.  Hematological:  Positive for bruising/bleeding tendency.  Psychiatric/Behavioral:  Negative for confusion.    PMFS History:  Patient Active Problem List   Diagnosis Date Noted   Osteoarthritis of left knee 04/07/2019   Left lateral knee pain 08/24/2017   S/P arthroscopic surgery of left knee 08/24/2017   Abnormal cervical Papanicolaou smear 08/31/2016   Obesity 08/31/2016   Premenstrual symptom 08/31/2016   Vitamin D deficiency 08/25/2016   History of chronic kidney disease 05/07/2016   Chronic kidney disease (CKD), stage III (moderate) (HCC) 03/30/2016   High risk medication use 01/10/2016   Primary osteoarthritis of both hips 01/10/2016   Primary osteoarthritis of both knees 01/10/2016   Primary osteoarthritis of both feet 01/10/2016   Bilateral plantar fasciitis 01/10/2016   Chest pain 01/26/2013   Polymyositis ([place]) 01/26/2013   Sjoegren syndrome     Past Medical History:  Diagnosis Date   Anemia    Chronic kidney disease    stage 3   GERD (gastroesophageal reflux disease)    Polymyositis (HCC)    also neuropathy of feet   Sjoegren syndrome     Family History  Problem Relation Age of Onset   Hypertension Sister    Hypertension Brother  Diabetes Brother    Hypertension Sister    Hypertension Sister    Hypertension Brother    Diabetes Brother    Past Surgical History:  Procedure Laterality Date   BUNIONECTOMY Bilateral    KNEE ARTHROPLASTY Left 2018   meniscal tear   KNEE ARTHROSCOPY WITH LATERAL MENISECTOMY Left 08/24/2017   Procedure: LEFT KNEE ARTHROSCOPY WITH LATERAL MENISECTOMY, CHONDROPLASTY, ARTHROSCOPIC ASSISTED INTERNAL FIXATION LATERAL TIBIAL PLATEAU;   Surgeon: Sydnee Cabal, MD;  Location: [facility];  Service: Orthopedics;  Laterality: Left;   KNEE SURGERY     right   MUSCLE BIOPSY     r/thigh   PLANTAR FASCIA SURGERY Right    TOTAL KNEE ARTHROPLASTY Left 04/07/2019   Procedure: TOTAL KNEE ARTHROPLASTY;  Surgeon: Sydnee Cabal, MD;  Location: [facility];  Service: Orthopedics;  Laterality: Left;  adductor canal   TOTAL SHOULDER ARTHROPLASTY Left 01/12/2017   Social History   Social History Narrative   Not on file   Immunization History  Administered Date(s) Administered   PFIZER(Purple Top)SARS-COV-2 Vaccination 08/17/2019, 09/07/2019, 06/30/2020     Objective: Vital Signs: BP 114/77 (BP Location: Left Arm, Patient Position: Sitting, Cuff Size: Normal)   Pulse 79   Resp 15   Ht 5\' 8"  (1.727 m)   Wt 213 lb 9.6 oz (96.9 kg)   BMI 32.48 kg/m    Physical Exam Vitals and nursing note reviewed.  Constitutional:      Appearance: She is well-developed.  HENT:     Head: Normocephalic and atraumatic.  Eyes:     Conjunctiva/sclera: Conjunctivae normal.  Cardiovascular:     Rate and Rhythm: Normal rate and regular rhythm.     Heart sounds: Normal heart sounds.  Pulmonary:     Effort: Pulmonary effort is normal.     Breath sounds: Normal breath sounds.  Abdominal:     General: Bowel sounds are normal.     Palpations: Abdomen is soft.  Musculoskeletal:     Cervical back: Normal range of motion.  Lymphadenopathy:     Cervical: No cervical adenopathy.  Skin:    General: Skin is warm and dry.     Capillary Refill: Capillary refill takes less than 2 seconds.  Neurological:     Mental Status: She is alert and oriented to person, place, and time.  Psychiatric:        Behavior: Behavior normal.     Musculoskeletal Exam: C-spine was in good range of motion.  She had discomfort range of motion of her right shoulder joint.  Elbow joints, wrist joints, MCPs PIPs and DIPs in good range of motion.  She had some  stiffness with range of motion of her bilateral hip joints.  Right knee joint was in good range of motion without any warmth swelling or effusion.  Left knee joint is replaced.  There was no tenderness over ankles or MTPs.  CDAI Exam: CDAI Score: -- Patient Global: --; Provider Global: -- Swollen: --; Tender: -- Joint Exam 08/02/2020   No joint exam has been documented for this visit   There is currently no information documented on the homunculus. Go to the Rheumatology activity and complete the homunculus joint exam.  Investigation: No additional findings.  Imaging: XR Shoulder Right  Result Date: 08/02/2020 No glenohumeral or acromial clavicular joint space narrowing was noted.  No chondrocalcinosis was noted. Impression: Unremarkable x-ray of the shoulder joint.   Recent Labs: Lab Results  Component Value Date   WBC 6.2 04/30/2020  HGB 12.6 04/30/2020   PLT 224 04/30/2020   NA 139 04/30/2020   K 4.1 04/30/2020   CL 106 04/30/2020   CO2 26 04/30/2020   GLUCOSE 93 04/30/2020   BUN 13 04/30/2020   CREATININE 1.41 (H) 04/30/2020   BILITOT 0.5 04/30/2020   ALKPHOS 57 04/04/2019   AST 16 04/30/2020   ALT 11 04/30/2020   PROT 6.4 04/30/2020   ALBUMIN 3.7 04/04/2019   CALCIUM 9.3 04/30/2020   GFRAA 50 (L) 04/30/2020    Speciality Comments: Fosamax to start date December 17, 2019  Procedures:  Large Joint Inj: R glenohumeral on 08/02/2020 11:06 AM Indications: pain Details: 27 G 1.5 in posterior  Arthrogram: No  Medications: 1 mL lidocaine 1 %; 40 mg triamcinolone acetonide 40 MG/ML Aspirate: 0 mL Outcome: tolerated well, no immediate complications   Allergies: Lyrica [pregabalin] and Percocet [oxycodone-acetaminophen]   Assessment / Plan:     Visit Diagnoses: Polymyositis ([place]) - History of elevated CK, positive muscle biopsy, myositis panel negative, initial CK 522: -She denies any increased muscle weakness or tenderness.  Plan: CK  High risk medication use -  Imuran 50 mg 1 tablet by mouth twice daily and prednisone 6 mg 1 tablet by mouth daily.  (Currently on prednisone 10 mg daily for 10 days) - Plan: CBC with Differential/Platelet, COMPLETE METABOLIC PANEL WITH GFR  Sjogren's syndrome with keratoconjunctivitis sicca (HCC) - ANA 1:40 centromere, +SSA: She continues to have sicca symptoms.  Over-the-counter products were discussed.  On prednisone therapy -she is on prednisone 6 mg by mouth daily.  The dose was recently increased by the orthopedic surgeon due to left knee joint pain.  She will be resuming prednisone 6 mg p.o. daily tomorrow.  Acute pain of right shoulder -she has been experiencing increased pain and discomfort in her right shoulder joint for the last 1 month.  She is having difficulty sleeping on the side.  There was no point tenderness.  Different treatment options were discussed.  After informed consent was obtained right knee joint was injected with cortisone as described above.  She tolerated the procedure well.  I will also give her a handout on exercises.  Plan: XR Shoulder Right.  X-ray of the shoulder joint was unremarkable.  X-ray findings were discussed with the patient.  She is having increased muscle spasms.  I will call in prescription for methocarbamol 500 mg p.o. twice daily for 5 days.  Side effects of muscle relaxer were discussed.  Primary osteoarthritis of both hips-she has some stiffness.  Primary osteoarthritis of right knee-no warmth or swelling was noted.  S/P total knee arthroplasty, left - 03/2019 by Dr. Theda Sers.  She had increased pain recently and was given increased dose of prednisone.  Symptoms are improving.  Primary osteoarthritis of both feet  DDD (degenerative disc disease), lumbar-she has off-and-on lower back pain.  Osteopenia of multiple sites - started on Fosamax on December 17, 2019.DEXA updated on 11/16/19: Left femoral neck BMD 0.759 with T-score -1.3. Negative 5% change in BMD of right total femur.   She has been tolerating Fosamax well.  We will refill Fosamax.  History of vitamin D deficiency-she is on vitamin D supplement.  History of chronic kidney disease - She is followed by Dr. Moshe Cipro.  Orders: Orders Placed This Encounter  Procedures   Large Joint Inj: R glenohumeral   XR Shoulder Right   CBC with Differential/Platelet   COMPLETE METABOLIC PANEL WITH GFR   CK   Meds ordered this  encounter  Medications   alendronate (FOSAMAX) 70 MG tablet    Sig: TAKE 1 TABLET(70 MG) BY MOUTH 1 TIME A WEEK WITH A FULL GLASS OF WATER AND ON AN EMPTY STOMACH    Dispense:  12 tablet    Refill:  0   azaTHIOprine (IMURAN) 50 MG tablet    Sig: Take 1 tablet (50 mg total) by mouth 2 (two) times daily.    Dispense:  180 tablet    Refill:  0   predniSONE (DELTASONE) 5 MG tablet    Sig: Take 1 tablet (5 mg total) by mouth daily with breakfast.    Dispense:  90 tablet    Refill:  0   methocarbamol (ROBAXIN) 500 MG tablet    Sig: Take 500mg  by mouth twice daily for 5 days.    Dispense:  10 tablet    Refill:  0     Follow-Up Instructions: Return in about 4 months (around 12/02/2020) for Polymyositis.   Bo Merino, MD  Note - This record has been created using Editor, commissioning.  Chart creation errors have been sought, but may not always  have been located. Such creation errors do not reflect on  the standard of medical care.

## 2020-07-30 ENCOUNTER — Other Ambulatory Visit: Payer: Self-pay | Admitting: *Deleted

## 2020-07-30 MED ORDER — PREDNISONE 1 MG PO TABS: 1.0000 mg | ORAL_TABLET | Freq: Every day | ORAL | 0 refills | Status: DC

## 2020-07-30 NOTE — Telephone Encounter (Signed)
Next Visit: 08/02/2020   Last Visit: 04/30/2020   Last Fill: 05/02/2020  Current dose per office note on 04/30/2020:  Please advise the patient to increase prednisone back to 6 mg daily.    Spoke with patient and verified patient is currently on Prednisone 6 mg daily.   Okay to refill Prednisone?

## 2020-08-02 ENCOUNTER — Ambulatory Visit: Payer: Self-pay

## 2020-08-02 ENCOUNTER — Other Ambulatory Visit: Payer: Self-pay

## 2020-08-02 ENCOUNTER — Ambulatory Visit: Payer: BC Managed Care – PPO | Admitting: Rheumatology

## 2020-08-02 ENCOUNTER — Encounter: Payer: Self-pay | Admitting: Rheumatology

## 2020-08-02 VITALS — BP 114/77 | HR 79 | Resp 15 | Ht 68.0 in | Wt 213.6 lb

## 2020-08-02 DIAGNOSIS — M5136 Other intervertebral disc degeneration, lumbar region: Secondary | ICD-10-CM

## 2020-08-02 DIAGNOSIS — G5701 Lesion of sciatic nerve, right lower limb: Secondary | ICD-10-CM

## 2020-08-02 DIAGNOSIS — M19071 Primary osteoarthritis, right ankle and foot: Secondary | ICD-10-CM

## 2020-08-02 DIAGNOSIS — M8589 Other specified disorders of bone density and structure, multiple sites: Secondary | ICD-10-CM

## 2020-08-02 DIAGNOSIS — M3320 Polymyositis, organ involvement unspecified: Secondary | ICD-10-CM | POA: Diagnosis not present

## 2020-08-02 DIAGNOSIS — Z87448 Personal history of other diseases of urinary system: Secondary | ICD-10-CM

## 2020-08-02 DIAGNOSIS — Z7952 Long term (current) use of systemic steroids: Secondary | ICD-10-CM | POA: Diagnosis not present

## 2020-08-02 DIAGNOSIS — M25511 Pain in right shoulder: Secondary | ICD-10-CM | POA: Diagnosis not present

## 2020-08-02 DIAGNOSIS — M1711 Unilateral primary osteoarthritis, right knee: Secondary | ICD-10-CM

## 2020-08-02 DIAGNOSIS — M19072 Primary osteoarthritis, left ankle and foot: Secondary | ICD-10-CM

## 2020-08-02 DIAGNOSIS — M3501 Sicca syndrome with keratoconjunctivitis: Secondary | ICD-10-CM | POA: Diagnosis not present

## 2020-08-02 DIAGNOSIS — Z96652 Presence of left artificial knee joint: Secondary | ICD-10-CM

## 2020-08-02 DIAGNOSIS — Z79899 Other long term (current) drug therapy: Secondary | ICD-10-CM | POA: Diagnosis not present

## 2020-08-02 DIAGNOSIS — Z8639 Personal history of other endocrine, nutritional and metabolic disease: Secondary | ICD-10-CM

## 2020-08-02 DIAGNOSIS — M160 Bilateral primary osteoarthritis of hip: Secondary | ICD-10-CM

## 2020-08-02 MED ORDER — LIDOCAINE HCL 1 % IJ SOLN
1.0000 mL | INTRAMUSCULAR | Status: AC | PRN
  Administered 2020-08-02: 1 mL

## 2020-08-02 MED ORDER — TRIAMCINOLONE ACETONIDE 40 MG/ML IJ SUSP
40.0000 mg | INTRAMUSCULAR | Status: AC | PRN
  Administered 2020-08-02: 40 mg via INTRA_ARTICULAR

## 2020-08-02 MED ORDER — AZATHIOPRINE 50 MG PO TABS: 50.0000 mg | ORAL_TABLET | Freq: Two times a day (BID) | ORAL | 0 refills | Status: DC

## 2020-08-02 MED ORDER — PREDNISONE 5 MG PO TABS: 5.0000 mg | ORAL_TABLET | Freq: Every day | ORAL | 0 refills | Status: DC

## 2020-08-02 MED ORDER — METHOCARBAMOL 500 MG PO TABS: ORAL_TABLET | ORAL | 0 refills | Status: DC

## 2020-08-02 MED ORDER — ALENDRONATE SODIUM 70 MG PO TABS: ORAL_TABLET | ORAL | 0 refills | Status: DC

## 2020-08-02 NOTE — Patient Instructions (Addendum)
Standing Labs We placed an order today for your standing lab work.   Please have your standing labs drawn in September and every 3 months  If possible, please have your labs drawn 2 weeks prior to your appointment so that the provider can discuss your results at your appointment.  Please note that you may see your imaging and lab results in [place] before we have reviewed them. We may be awaiting multiple results to interpret others before contacting you. Please allow our office up to 72 hours to thoroughly review all of the results before contacting the office for clarification of your results.  We have open lab daily: Monday through Thursday from 1:30-4:30 PM and Friday from 1:30-4:00 PM at the office of Dr. Bo Merino, [facility].   Please be advised, all patients with office appointments requiring lab work will take precedent over walk-in lab work.  If possible, please come for your lab work on Monday and Friday afternoons, as you may experience shorter wait times. The office is located at [address] [place], [place], [place] [address] No appointment is necessary.   Labs are drawn by [facility]. Please bring your co-pay at the time of your lab draw.  You may receive a bill from [facility] for your lab work.  If you wish to have your labs drawn at another location, please call the office 24 hours in advance to send orders.  If you have any questions regarding directions or hours of operation,  please call [phone].   As a reminder, please drink plenty of water prior to coming for your lab work. Thanks!    Vaccines You are taking a medication(s) that can suppress your immune system.  The following immunizations are recommended: Flu annually Covid-19  Td/Tdap (tetanus, diphtheria, pertussis) every 10 years Pneumonia (Prevnar 15 then Pneumovax 23 at least 1 year apart.  Alternatively, can take Prevnar 20 without needing additional dose) Shingrix (after age 5): 2  doses from 4 weeks to 6 months apart  Please check with your PCP to make sure you are up to date.   If you test POSITIVE for COVID19 and have MILD to MODERATE symptoms: First, call your PCP if you would like to receive COVID19 treatment AND Hold your medications during the infection and for at least 1 week after your symptoms have resolved: Injectable medication (Benlysta, Cimzia, Cosentyx, Enbrel, Humira, Orencia, Remicade, Simponi, Stelara, Taltz, Tremfya) Methotrexate Leflunomide (Arava) Mycophenolate (Cellcept) Morrie Sheldon, Olumiant, or Rinvoq If you take Actemra or Kevzara, you DO NOT need to hold these for COVID19 infection.  If you test POSITIVE for COVID19 and have NO symptoms: First, call your PCP if you would like to receive COVID19 treatment AND Hold your medications for at least 10 days after the day that you tested positive Injectable medication (Benlysta, Cimzia, Cosentyx, Enbrel, Humira, Orencia, Remicade, Simponi, Stelara, Taltz, Tremfya) Methotrexate Leflunomide (Arava) Mycophenolate (Cellcept) Morrie Sheldon, Olumiant, or Rinvoq If you take Actemra or Kevzara, you DO NOT need to hold these for COVID19 infection.  If you have signs or symptoms of an infection or start antibiotics: First, call your PCP for workup of your infection. Hold your medication through the infection, until you complete your antibiotics, and until symptoms resolve if you take the following: Injectable medication (Actemra, Benlysta, Cimzia, Cosentyx, Enbrel, Humira, Kevzara, Orencia, Remicade, Simponi, Stelara, Taltz, Tremfya) Methotrexate Leflunomide (Arava) Mycophenolate (Cellcept) Roma Kayser, or Rinvoq  Heart Disease Prevention   Your inflammatory disease increases your risk of heart disease which  includes heart attack, stroke, atrial fibrillation (irregular heartbeats), high blood pressure, heart failure and atherosclerosis (plaque in the arteries).  It is important to reduce your risk by:    Keep blood pressure, cholesterol, and blood sugar at healthy levels   Smoking Cessation   Maintain a healthy weight  BMI 20-25   Eat a healthy diet  Plenty of fresh fruit, vegetables, and whole grains  Limit saturated fats, foods high in sodium, and added sugars  DASH and Mediterranean diet   Increase physical activity  Recommend moderate physically activity for 150 minutes per week/ 30 minutes a day for five days a week These can be broken up into three separate ten-minute sessions during the day.   Reduce Stress  Meditation, slow breathing exercises, yoga, coloring books  Dental visits twice a year    Shoulder Exercises Ask your health care provider which exercises are safe for you. Do exercises exactly as told by your health care provider and adjust them as directed. It is normal to feel mild stretching, pulling, tightness, or discomfort as you do these exercises. Stop right away if you feel sudden pain or your pain gets worse. Do not begin these exercises until told by your health care provider. Stretching exercises External rotation and abduction This exercise is sometimes called corner stretch. This exercise rotates your arm outward (external rotation) and moves your arm out from your body (abduction). Stand in a doorway with one of your feet slightly in front of the other. This is called a staggered stance. If you cannot reach your forearms to the door frame, stand facing a corner of a room. Choose one of the following positions as told by your health care provider: Place your hands and forearms on the door frame above your head. Place your hands and forearms on the door frame at the height of your head. Place your hands on the door frame at the height of your elbows. Slowly move your weight onto your front foot until you feel a stretch across your chest and in the front of your shoulders. Keep your head and chest upright and keep your abdominal muscles tight. Hold for  __________ seconds. To release the stretch, shift your weight to your back foot. Repeat __________ times. Complete this exercise __________ times a day. Extension, standing Stand and hold a broomstick, a cane, or a similar object behind your back. Your hands should be a little wider than shoulder width apart. Your palms should face away from your back. Keeping your elbows straight and your shoulder muscles relaxed, move the stick away from your body until you feel a stretch in your shoulders (extension). Avoid shrugging your shoulders while you move the stick. Keep your shoulder blades tucked down toward the middle of your back. Hold for __________ seconds. Slowly return to the starting position. Repeat __________ times. Complete this exercise __________ times a day. Range-of-motion exercises Pendulum  Stand near a wall or a surface that you can hold onto for balance. Bend at the waist and let your left / right arm hang straight down. Use your other arm to support you. Keep your back straight and do not lock your knees. Relax your left / right arm and shoulder muscles, and move your hips and your trunk so your left / right arm swings freely. Your arm should swing because of the motion of your body, not because you are using your arm or shoulder muscles. Keep moving your hips and trunk so your arm swings in  the following directions, as told by your health care provider: Side to side. Forward and backward. In clockwise and counterclockwise circles. Continue each motion for __________ seconds, or for as long as told by your health care provider. Slowly return to the starting position. Repeat __________ times. Complete this exercise __________ times a day. Shoulder flexion, standing  Stand and hold a broomstick, a cane, or a similar object. Place your hands a little more than shoulder width apart on the object. Your left / right hand should be palm up, and your other hand should be palm  down. Keep your elbow straight and your shoulder muscles relaxed. Push the stick up with your healthy arm to raise your left / right arm in front of your body, and then over your head until you feel a stretch in your shoulder (flexion). Avoid shrugging your shoulder while you raise your arm. Keep your shoulder blade tucked down toward the middle of your back. Hold for __________ seconds. Slowly return to the starting position. Repeat __________ times. Complete this exercise __________ times a day. Shoulder abduction, standing Stand and hold a broomstick, a cane, or a similar object. Place your hands a little more than shoulder width apart on the object. Your left / right hand should be palm up, and your other hand should be palm down. Keep your elbow straight and your shoulder muscles relaxed. Push the object across your body toward your left / right side. Raise your left / right arm to the side of your body (abduction) until you feel a stretch in your shoulder. Do not raise your arm above shoulder height unless your health care provider tells you to do that. If directed, raise your arm over your head. Avoid shrugging your shoulder while you raise your arm. Keep your shoulder blade tucked down toward the middle of your back. Hold for __________ seconds. Slowly return to the starting position. Repeat __________ times. Complete this exercise __________ times a day. Internal rotation  Place your left / right hand behind your back, palm up. Use your other hand to dangle an exercise band, a towel, or a similar object over your shoulder. Grasp the band with your left / right hand so you are holding on to both ends. Gently pull up on the band until you feel a stretch in the front of your left / right shoulder. The movement of your arm toward the center of your body is called internal rotation. Avoid shrugging your shoulder while you raise your arm. Keep your shoulder blade tucked down toward the middle  of your back. Hold for __________ seconds. Release the stretch by letting go of the band and lowering your hands. Repeat __________ times. Complete this exercise __________ times a day. Strengthening exercises External rotation  Sit in a stable chair without armrests. Secure an exercise band to a stable object at elbow height on your left / right side. Place a soft object, such as a folded towel or a small pillow, between your left / right upper arm and your body to move your elbow about 4 inches (10 cm) away from your side. Hold the end of the exercise band so it is tight and there is no slack. Keeping your elbow pressed against the soft object, slowly move your forearm out, away from your abdomen (external rotation). Keep your body steady so only your forearm moves. Hold for __________ seconds. Slowly return to the starting position. Repeat __________ times. Complete this exercise __________ times a day. Shoulder  abduction  Sit in a stable chair without armrests, or stand up. Hold a __________ weight in your left / right hand, or hold an exercise band with both hands. Start with your arms straight down and your left / right palm facing in, toward your body. Slowly lift your left / right hand out to your side (abduction). Do not lift your hand above shoulder height unless your health care provider tells you that this is safe. Keep your arms straight. Avoid shrugging your shoulder while you do this movement. Keep your shoulder blade tucked down toward the middle of your back. Hold for __________ seconds. Slowly lower your arm, and return to the starting position. Repeat __________ times. Complete this exercise __________ times a day. Shoulder extension Sit in a stable chair without armrests, or stand up. Secure an exercise band to a stable object in front of you so it is at shoulder height. Hold one end of the exercise band in each hand. Your palms should face each other. Straighten  your elbows and lift your hands up to shoulder height. Step back, away from the secured end of the exercise band, until the band is tight and there is no slack. Squeeze your shoulder blades together as you pull your hands down to the sides of your thighs (extension). Stop when your hands are straight down by your sides. Do not let your hands go behind your body. Hold for __________ seconds. Slowly return to the starting position. Repeat __________ times. Complete this exercise __________ times a day. Shoulder row Sit in a stable chair without armrests, or stand up. Secure an exercise band to a stable object in front of you so it is at waist height. Hold one end of the exercise band in each hand. Position your palms so that your thumbs are facing the ceiling (neutral position). Bend each of your elbows to a 90-degree angle (right angle) and keep your upper arms at your sides. Step back until the band is tight and there is no slack. Slowly pull your elbows back behind you. Hold for __________ seconds. Slowly return to the starting position. Repeat __________ times. Complete this exercise __________ times a day. Shoulder press-ups  Sit in a stable chair that has armrests. Sit upright, with your feet flat on the floor. Put your hands on the armrests so your elbows are bent and your fingers are pointing forward. Your hands should be about even with the sides of your body. Push down on the armrests and use your arms to lift yourself off the chair. Straighten your elbows and lift yourself up as much as you comfortably can. Move your shoulder blades down, and avoid letting your shoulders move up toward your ears. Keep your feet on the ground. As you get stronger, your feet should support less of your body weight as you lift yourself up. Hold for __________ seconds. Slowly lower yourself back into the chair. Repeat __________ times. Complete this exercise __________ times a day. Wall  push-ups  Stand so you are facing a stable wall. Your feet should be about one arm-length away from the wall. Lean forward and place your palms on the wall at shoulder height. Keep your feet flat on the floor as you bend your elbows and lean forward toward the wall. Hold for __________ seconds. Straighten your elbows to push yourself back to the starting position. Repeat __________ times. Complete this exercise __________ times a day. This information is not intended to replace advice given to you  by your health care provider. Make sure you discuss any questions you have with your healthcare provider. Document Revised: 05/27/2018 Document Reviewed: 03/04/2018 Elsevier Patient Education  [place].

## 2020-08-03 LAB — CBC WITH DIFFERENTIAL/PLATELET
Absolute Monocytes: 353 cells/uL (ref 200–950)
Basophils Absolute: 17 cells/uL (ref 0–200)
Basophils Relative: 0.2 %
Eosinophils Absolute: 0 cells/uL — ABNORMAL LOW (ref 15–500)
Eosinophils Relative: 0 %
HCT: 40 % (ref 35.0–45.0)
Hemoglobin: 12.9 g/dL (ref 11.7–15.5)
Lymphs Abs: 806 cells/uL — ABNORMAL LOW (ref 850–3900)
MCH: 28.6 pg (ref 27.0–33.0)
MCHC: 32.3 g/dL (ref 32.0–36.0)
MCV: 88.7 fL (ref 80.0–100.0)
MPV: 9.6 fL (ref 7.5–12.5)
Monocytes Relative: 4.2 %
Neutro Abs: 7224 cells/uL (ref 1500–7800)
Neutrophils Relative %: 86 %
Platelets: 241 10*3/uL (ref 140–400)
RBC: 4.51 10*6/uL (ref 3.80–5.10)
RDW: 13.2 % (ref 11.0–15.0)
Total Lymphocyte: 9.6 %
WBC: 8.4 10*3/uL (ref 3.8–10.8)

## 2020-08-03 LAB — COMPLETE METABOLIC PANEL WITH GFR
AG Ratio: 1.2 (calc) (ref 1.0–2.5)
ALT: 11 U/L (ref 6–29)
AST: 15 U/L (ref 10–35)
Albumin: 3.7 g/dL (ref 3.6–5.1)
Alkaline phosphatase (APISO): 54 U/L (ref 37–153)
BUN/Creatinine Ratio: 11 (calc) (ref 6–22)
BUN: 16 mg/dL (ref 7–25)
CO2: 26 mmol/L (ref 20–32)
Calcium: 8.9 mg/dL (ref 8.6–10.4)
Chloride: 103 mmol/L (ref 98–110)
Creat: 1.4 mg/dL — ABNORMAL HIGH (ref 0.50–1.05)
GFR, Est African American: 50 mL/min/{1.73_m2} — ABNORMAL LOW (ref >=60)
GFR, Est Non African American: 43 mL/min/{1.73_m2} — ABNORMAL LOW (ref >=60)
Globulin: 3 g/dL (calc) (ref 1.9–3.7)
Glucose, Bld: 103 mg/dL — ABNORMAL HIGH (ref 65–99)
Potassium: 4.4 mmol/L (ref 3.5–5.3)
Sodium: 139 mmol/L (ref 135–146)
Total Bilirubin: 0.4 mg/dL (ref 0.2–1.2)
Total Protein: 6.7 g/dL (ref 6.1–8.1)

## 2020-08-03 LAB — CK: Total CK: 138 U/L (ref 29–143)

## 2020-08-05 NOTE — Progress Notes (Signed)
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  GFR is low and stable.  CK is normal.

## 2020-08-09 ENCOUNTER — Other Ambulatory Visit (HOSPITAL_COMMUNITY): Payer: Self-pay | Admitting: Physician Assistant

## 2020-08-09 DIAGNOSIS — Z96652 Presence of left artificial knee joint: Secondary | ICD-10-CM

## 2020-08-22 ENCOUNTER — Ambulatory Visit (HOSPITAL_COMMUNITY)
Admission: RE | Admit: 2020-08-22 | Discharge: 2020-08-22 | Disposition: A | Discharge status: Home / Self Care | Payer: BC Managed Care – PPO | Source: Ambulatory Visit | Attending: Physician Assistant | Admitting: Physician Assistant

## 2020-08-22 ENCOUNTER — Encounter (HOSPITAL_COMMUNITY)
Admission: RE | Admit: 2020-08-22 | Discharge: 2020-08-22 | Disposition: A | Discharge status: Home / Self Care | Payer: BC Managed Care – PPO | Source: Ambulatory Visit | Attending: Physician Assistant | Admitting: Physician Assistant

## 2020-08-22 ENCOUNTER — Other Ambulatory Visit: Payer: Self-pay

## 2020-08-22 DIAGNOSIS — Z96652 Presence of left artificial knee joint: Secondary | ICD-10-CM | POA: Diagnosis present

## 2020-08-22 MED ORDER — TECHNETIUM TC 99M MEDRONATE IV KIT
20.0000 | PACK | Freq: Once | INTRAVENOUS | Status: AC | PRN
  Administered 2020-08-22: 20 via INTRAVENOUS

## 2020-08-26 ENCOUNTER — Other Ambulatory Visit: Payer: Self-pay | Admitting: Physician Assistant

## 2020-08-26 NOTE — Telephone Encounter (Signed)
Next Visit: 11/29/2020  Last Visit: 08/02/2020  Last Fill:05/27/2020  Dx: Sjogren's syndrome with keratoconjunctivitis sicca  Current Dose per office note on 08/02/2020: not discussed  Okay to refill [place]?

## 2020-10-20 ENCOUNTER — Other Ambulatory Visit: Payer: Self-pay | Admitting: Rheumatology

## 2020-10-22 NOTE — Telephone Encounter (Signed)
Next Visit: 11/29/2020   Last Visit: 08/02/2020   Last Fill: 08/02/2020  Dx: Osteopenia of multiple sites    Current Dose per office note on 08/02/2020: started on Fosamax on December 17, 2019.  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  GFR is low and stable.  CK is normal.  Okay to refill Fosamax?

## 2020-11-12 ENCOUNTER — Other Ambulatory Visit: Payer: Self-pay | Admitting: Physician Assistant

## 2020-11-12 ENCOUNTER — Other Ambulatory Visit: Payer: Self-pay | Admitting: Rheumatology

## 2020-11-12 NOTE — Telephone Encounter (Signed)
Next Visit: 11/29/2020   Last Visit: 08/02/2020   Last Fill: 08/02/2020  Dx: Sjogren's syndrome with keratoconjunctivitis sicca   Current Dose per office note on 08/02/2020:  prednisone 6 mg by mouth daily  Okay to refill prednisone?

## 2020-11-12 NOTE — Telephone Encounter (Signed)
Next Visit: 11/29/2020   Last Visit: 08/02/2020   Last Fill: 07/30/2020  Dx: Sjogren's syndrome with keratoconjunctivitis sicca   Current Dose per office note on 08/02/2020:  prednisone 6 mg by mouth daily  Okay to refill prednisone?

## 2020-11-15 ENCOUNTER — Other Ambulatory Visit (HOSPITAL_COMMUNITY): Payer: Self-pay | Admitting: Physician Assistant

## 2020-11-15 ENCOUNTER — Ambulatory Visit (HOSPITAL_COMMUNITY)
Admission: RE | Admit: 2020-11-15 | Discharge: 2020-11-15 | Disposition: A | Discharge status: Home / Self Care | Payer: BC Managed Care – PPO | Source: Ambulatory Visit | Attending: Cardiology | Admitting: Cardiology

## 2020-11-15 ENCOUNTER — Other Ambulatory Visit: Payer: Self-pay

## 2020-11-15 DIAGNOSIS — M79605 Pain in left leg: Secondary | ICD-10-CM | POA: Insufficient documentation

## 2020-11-15 DIAGNOSIS — M79662 Pain in left lower leg: Secondary | ICD-10-CM | POA: Insufficient documentation

## 2020-11-15 NOTE — Progress Notes (Signed)
Office Visit Note  Patient: Connie Brady             Date of Birth: Jan 04, 1971           MRN: [id number]             PCP: Kelton Pillar, MD Referring: Kelton Pillar, MD Visit Date: 11/29/2020 Occupation: @GUAROCC @  Subjective:  Left calf tightness   History of Present Illness: Connie Brady is a 50 y.o. female with history of polymyositis, Sjogren's syndrome, and osteoarthritis.  She is currently on Imuran 50 mg 1 tablet twice daily and prednisone 6 mg daily.  She has not missed any doses of these medications recently.  She denies any signs or symptoms of a polymyositis flare recently.  She states she continues to have chronic pain in the left knee which is replaced.  She had a bone scan performed on 08/22/2020 which was overall unremarkable according to the patient.  She followed up with her orthopedist and no changes were recommended at that time.  She states that she has been experiencing increased left calf tightness and tenderness over the past 1 month.  She denies any swelling in her calf.  She had a venous ultrasound to rule out DVT on 11/15/2020 which was negative.  She took a prescription for methocarbamol 500 mg twice daily as needed for 5 days and noticed some improvement in her symptoms.  She has been walking and weightlifting for exercise.  She denies any increased muscle weakness.  She has had some muscle cramps in her feet but has not been taking magnesium malate recently.  She states that she has had some symptoms of Raynaud's in her fingertips but denies any digital ulcerations. She continues to take Fosamax 70 mg 1 tablet by mouth once weekly and vitamin D supplement daily.  No recent falls or fractures.    Activities of Daily Living:  Patient reports morning stiffness for all day. Patient Reports nocturnal pain.  Difficulty dressing/grooming: Denies Difficulty climbing stairs: Reports Difficulty getting out of chair: Reports Difficulty using hands for taps,  buttons, cutlery, and/or writing: Denies  Review of Systems  Constitutional:  Positive for fatigue.  HENT:  Positive for mouth dryness. Negative for mouth sores and nose dryness.   Eyes:  Positive for itching. Negative for pain, visual disturbance and dryness.  Respiratory:  Negative for cough, hemoptysis, shortness of breath and difficulty breathing.   Cardiovascular:  Positive for palpitations. Negative for chest pain, hypertension and swelling in legs/feet.  Gastrointestinal:  Negative for blood in stool, constipation and diarrhea.  Endocrine: Negative for increased urination.  Genitourinary:  Negative for difficulty urinating and painful urination.  Musculoskeletal:  Positive for joint pain, joint pain, joint swelling, myalgias, morning stiffness, muscle tenderness and myalgias. Negative for muscle weakness.  Skin:  Positive for color change. Negative for pallor, rash, hair loss, nodules/bumps, redness, skin tightness, ulcers and sensitivity to sunlight.  Allergic/Immunologic: Negative for susceptible to infections.  Neurological:  Positive for numbness. Negative for dizziness, headaches, memory loss and weakness.  Hematological:  Positive for bruising/bleeding tendency. Negative for swollen glands.  Psychiatric/Behavioral:  Negative for depressed mood, confusion and sleep disturbance. The patient is not nervous/anxious.    PMFS History:  Patient Active Problem List   Diagnosis Date Noted   Osteoarthritis of left knee 04/07/2019   Left lateral knee pain 08/24/2017   S/P arthroscopic surgery of left knee 08/24/2017   Abnormal cervical Papanicolaou smear 08/31/2016   Obesity 08/31/2016  Premenstrual symptom 08/31/2016   Vitamin D deficiency 08/25/2016   History of chronic kidney disease 05/07/2016   Chronic kidney disease (CKD), stage III (moderate) (HCC) 03/30/2016   High risk medication use 01/10/2016   Primary osteoarthritis of both hips 01/10/2016   Primary osteoarthritis of  both knees 01/10/2016   Primary osteoarthritis of both feet 01/10/2016   Bilateral plantar fasciitis 01/10/2016   Chest pain 01/26/2013   Polymyositis ([place]) 01/26/2013   Sjoegren syndrome     Past Medical History:  Diagnosis Date   Anemia    Chronic kidney disease    stage 3   GERD (gastroesophageal reflux disease)    Polymyositis (HCC)    also neuropathy of feet   Sjoegren syndrome     Family History  Problem Relation Age of Onset   Hypertension Sister    Hypertension Brother    Diabetes Brother    Hypertension Sister    Hypertension Sister    Hypertension Brother    Diabetes Brother    Past Surgical History:  Procedure Laterality Date   BUNIONECTOMY Bilateral    KNEE ARTHROPLASTY Left 2018   meniscal tear   KNEE ARTHROSCOPY WITH LATERAL MENISECTOMY Left 08/24/2017   Procedure: LEFT KNEE ARTHROSCOPY WITH LATERAL MENISECTOMY, CHONDROPLASTY, ARTHROSCOPIC ASSISTED INTERNAL FIXATION LATERAL TIBIAL PLATEAU;  Surgeon: Sydnee Cabal, MD;  Location: [facility];  Service: Orthopedics;  Laterality: Left;   KNEE SURGERY     right   MUSCLE BIOPSY     r/thigh   PLANTAR FASCIA SURGERY Right    TOTAL KNEE ARTHROPLASTY Left 04/07/2019   Procedure: TOTAL KNEE ARTHROPLASTY;  Surgeon: Sydnee Cabal, MD;  Location: [facility];  Service: Orthopedics;  Laterality: Left;  adductor canal   TOTAL SHOULDER ARTHROPLASTY Left 01/12/2017   Social History   Social History Narrative   Not on file   Immunization History  Administered Date(s) Administered   PFIZER(Purple Top)SARS-COV-2 Vaccination 08/17/2019, 09/07/2019, 06/30/2020     Objective: Vital Signs: BP 116/75 (BP Location: Left Arm, Patient Position: Sitting, Cuff Size: Normal)   Pulse 89   Ht 5\' 8"  (1.727 m)   Wt 225 lb 9.6 oz (102.3 kg)   BMI 34.30 kg/m    Physical Exam Vitals and nursing note reviewed.  Constitutional:      Appearance: She is well-developed.  HENT:     Head: Normocephalic and atraumatic.   Eyes:     Conjunctiva/sclera: Conjunctivae normal.  Pulmonary:     Effort: Pulmonary effort is normal.  Abdominal:     Palpations: Abdomen is soft.  Musculoskeletal:     Cervical back: Normal range of motion.  Skin:    General: Skin is warm and dry.     Capillary Refill: Capillary refill takes less than 2 seconds.     Comments: No digital ulcerations or signs of gangrene.  No signs of sclerodactyly noted.  Neurological:     Mental Status: She is alert and oriented to person, place, and time.  Psychiatric:        Behavior: Behavior normal.     Musculoskeletal Exam: C-spine has good ROM with no discomfort.  Trapezius muscle tension and tenderness bilaterally.  Shoulder joints, elbow joints, wrist joints, MCPs, PIPs, and DIPs good ROM with no synovitis.  Complete fist formation bilaterally.  DIP prominence consistent with OA of both hands. Hip joints good ROM with no discomfort. Right knee has good ROM with no warmth or effusion.  Painful ROM of left knee replacement.  Warmth of  the left knee.  Tenderness over bilateral calves, left greater than right.  No swelling or erythema noted.  No muscular atrophy or weakness noted.  Some tenderness over the left achilles tendon.  Ankle joints have good ROM with no tenderness or joint swelling.   CDAI Exam: CDAI Score: -- Patient Global: --; Provider Global: -- Swollen: --; Tender: -- Joint Exam 11/29/2020   No joint exam has been documented for this visit   There is currently no information documented on the homunculus. Go to the Rheumatology activity and complete the homunculus joint exam.  Investigation: No additional findings.  Imaging: VAS [place] LOWER EXTREMITY VENOUS (DVT)  Result Date: 11/15/2020  Lower Venous DVT Study Patient Name:  Connie Brady  Date of Exam:   11/15/2020 Medical Rec #: [id number]          Accession #:    [id number] Date of Birth: 1970/05/09          Patient Gender: F Patient Age:   51 years Exam Location:   [facility] Procedure:      VAS [place] LOWER EXTREMITY VENOUS (DVT) Referring Phys: Berna Spare --------------------------------------------------------------------------------  Indications: Patient presents today with complaints of chronic left calf tightness and pain for several years that has increasingly gotten worse in the past 3 months. There are multiple tiny superficial knots felt throughout the calf upon palpation. She does c/o SOB x 1 week.  Performing Technologist: Sharlett Iles RVT  Examination Guidelines: A complete evaluation includes B-mode imaging, spectral Doppler, color Doppler, and power Doppler as needed of all accessible portions of each vessel. Bilateral testing is considered an integral part of a complete examination. Limited examinations for reoccurring indications may be performed as noted. The reflux portion of the exam is performed with the patient in reverse Trendelenburg.  +---------+---------------+---------+-----------+----------+--------------+ RIGHT    CompressibilityPhasicitySpontaneityPropertiesThrombus Aging +---------+---------------+---------+-----------+----------+--------------+ CFV      Full           Yes      Yes                                 +---------+---------------+---------+-----------+----------+--------------+ SFJ      Full           Yes      Yes                                 +---------+---------------+---------+-----------+----------+--------------+ FV Prox  Full           Yes      Yes                                 +---------+---------------+---------+-----------+----------+--------------+ FV Mid   Full                                                        +---------+---------------+---------+-----------+----------+--------------+ FV DistalFull           Yes      Yes                                 +---------+---------------+---------+-----------+----------+--------------+ PFV  Full           Yes      Yes                                  +---------+---------------+---------+-----------+----------+--------------+ POP      Full           Yes      Yes                                 +---------+---------------+---------+-----------+----------+--------------+ PTV      Full           Yes      Yes                                 +---------+---------------+---------+-----------+----------+--------------+ PERO     Full                    No                                  +---------+---------------+---------+-----------+----------+--------------+ Soleal   Full                    No                                  +---------+---------------+---------+-----------+----------+--------------+ Gastroc  Full                                                        +---------+---------------+---------+-----------+----------+--------------+ GSV      Full           Yes      Yes                                 +---------+---------------+---------+-----------+----------+--------------+   +---------+---------------+---------+-----------+----------+--------------+ LEFT     CompressibilityPhasicitySpontaneityPropertiesThrombus Aging +---------+---------------+---------+-----------+----------+--------------+ CFV      Full           Yes      Yes                                 +---------+---------------+---------+-----------+----------+--------------+ SFJ      Full           Yes      Yes                                 +---------+---------------+---------+-----------+----------+--------------+ FV Prox  Full           Yes      Yes                                 +---------+---------------+---------+-----------+----------+--------------+ FV Mid   Full                                                        +---------+---------------+---------+-----------+----------+--------------+  FV DistalFull           Yes      Yes                                  +---------+---------------+---------+-----------+----------+--------------+ PFV      Full           Yes      Yes                                 +---------+---------------+---------+-----------+----------+--------------+ POP      Full           Yes      Yes                                 +---------+---------------+---------+-----------+----------+--------------+ PTV      Full                    Yes                                 +---------+---------------+---------+-----------+----------+--------------+ PERO     Full                    No                                  +---------+---------------+---------+-----------+----------+--------------+ Gastroc  Full                                                        +---------+---------------+---------+-----------+----------+--------------+ GSV      Full           Yes      Yes                                 +---------+---------------+---------+-----------+----------+--------------+     Summary: BILATERAL: - No evidence of deep vein thrombosis seen in the lower extremities, bilaterally. - No evidence of superficial venous thrombosis in the lower extremities, bilaterally. -No evidence of popliteal cyst, bilaterally.   *See table(s) above for measurements and observations. Electronically signed by Ida Rogue MD on 11/15/2020 at 8:04:41 PM.    Final     Recent Labs: Lab Results  Component Value Date   WBC 6.0 11/25/2020   HGB 11.8 11/25/2020   PLT 239 11/25/2020   NA 139 11/25/2020   K 3.9 11/25/2020   CL 107 11/25/2020   CO2 28 11/25/2020   GLUCOSE 77 11/25/2020   BUN 13 11/25/2020   CREATININE 1.51 (H) 11/25/2020   BILITOT 0.3 11/25/2020   ALKPHOS 57 04/04/2019   AST 19 11/25/2020   ALT 11 11/25/2020   PROT 6.1 11/25/2020   ALBUMIN 3.7 04/04/2019   CALCIUM 8.9 11/25/2020   GFRAA 50 (L) 08/02/2020    Speciality Comments: Fosamax to start date December 17, 2019  Procedures:  No procedures  performed Allergies: Lyrica [pregabalin] and Percocet [oxycodone-acetaminophen]    CK was 248 on  11/25/2020.  We will continue to monitor CK every 3 months.    Assessment / Plan:     Visit Diagnoses: Polymyositis ([place]) - History of elevated CK, positive muscle biopsy, myositis panel negative, initial CK 522: She has not been experiencing any signs or symptoms of a polymyositis flare.  She is clinically doing well on Imuran 50 mg 1 tablet twice daily and prednisone 6 mg daily.  She has not missed any doses of these medications recently and continues to tolerate them without any side effects.  No muscle weakness was noted on examination today.  CK was 248 on 11/25/2020.  She continues to walk and lift weights for exercise on a regular basis.  No signs of inflammatory arthritis were noted at this time.  She will remain on the current treatment regimen.  She was advised to notify us if she develops signs or symptoms of a flare.  We will continue to monitor CK every 3 months.  She will follow-up in the office in 3 months.  10/31/19: CK 128, 04/30/2020: CK 216, 08/02/2020 CK 138, and 11/25/20: CK 248.  CK will continue to be checked every 3 months.  Standing order for CK remains in place.  High risk medication use - Imuran 50 mg 1 tablet by mouth twice daily and prednisone 6 mg mouth daily. CBC and CMP updated on 11/25/2020.  She has followed by Dr. Moshe Cipro at [facility].  Results were forwarded to Dr. Moshe Cipro once they resulted. She will have updated lab work in January and every 3 months to monitor for drug toxicity.  Standing orders for CBC and CMP remain in place.  Sjogren's syndrome with keratoconjunctivitis sicca (HCC) - ANA 1:40 centromere, +SSA: She continues to have chronic dry mouth.  She takes pilocarpine 1 to 2 tablets daily as needed for symptomatic relief.  On prednisone therapy - Prednisone 6 mg by mouth daily.  She does not need a refill at this time.  She is aware of the  risks of long term prednisone use.   Primary osteoarthritis of both hips: She has good ROM of both hip joints on examination today with no discomfort. No groin pain currently.   S/P total knee arthroplasty, left: Chronic pain.  Bone scan performed on 08/22/2020: Focal delayed increased uptake of tracer at lateral left tibial plateau adjacent to prosthesis, question postsurgical versus aseptic loosening.  According to the patient she followed up with her orthopedist to discuss the bone scan results and no further recommendations were made at that time.  She has been experiencing increased tenderness and tightness especially on the left side for over 1 month.  She had a venous ultrasound performed on 11/15/2020 which was negative for a DVT.  No calf swelling or erythema was noted.  She had tenderness and tightness on the anterior lateral aspect of the left lower leg and tenderness over the IT band insertion site.  She declined referral to physical therapy at this time.  Discussed trying to use a foam roller and to perform home exercises.  She is planning on having massage today.  She was advised to notify us if her symptoms persist or worsen.  A new prescription for methocarbamol 500 mg 1 tablet twice daily as needed was sent to the pharmacy today.  Primary osteoarthritis of right knee: She has good range of motion of the right knee joint with no discomfort.  No warmth or effusion was noted.  Primary osteoarthritis of both feet: She has good range  of motion of both ankle joints with no tenderness or joint swelling.  She has been experiencing cramping in her feet intermittently.  We discussed the use of magnesium malate 500 mg at bedtime muscle cramps.  DDD (degenerative disc disease), lumbar: Chronic pain.  No symptoms of radiculopathy at this time.  Osteopenia of multiple sites: DEXA updated on 11/16/19: LFN BMD 0.759 with T-score -1.3.  She is taking fosamax 70 mg 1 tablet by mouth once weekly and vitamin D  daily.  She has not had any recent falls or fractures. She walks and lifts weights for exercise.   History of vitamin D deficiency: She is taking vitamin D supplement daily.  History of chronic kidney disease: Creatinine was 1.51 and GFR was 41 on 11/25/2020.  Followed by Dr. Moshe Cipro.   Raynaud's phenomenon without gangrene: She has been experiencing signs and symptoms of Raynaud's phenomenon intermittently.  She had good capillary refill on examination today with no digital ulcerations or signs of gangrene.  No signs of sclerodactyly noted.  Discussed the condition and addressed the importance of avoiding triggers including exposure to cold temperatures, extreme emotional stress, and tobacco smoke.  Discussed the importance of keeping her core body temperature warm as well as wearing gloves and socks.  She was advised to notify us if she develops any new or worsening symptoms.  Orders: No orders of the defined types were placed in this encounter.  Meds ordered this encounter  Medications   methocarbamol (ROBAXIN) 500 MG tablet    Sig: Take 1 tablet (500 mg total) by mouth 2 (two) times daily as needed for muscle spasms.    Dispense:  30 tablet    Refill:  0      Follow-Up Instructions: Return in about 3 months (around 03/01/2021) for Polymyositis, Sjogren's syndrome, Osteoarthritis.   Ofilia Neas, PA-C  Note - This record has been created using Dragon software.  Chart creation errors have been sought, but may not always  have been located. Such creation errors do not reflect on  the standard of medical care.

## 2020-11-25 ENCOUNTER — Other Ambulatory Visit: Payer: Self-pay | Admitting: *Deleted

## 2020-11-25 DIAGNOSIS — Z79899 Other long term (current) drug therapy: Secondary | ICD-10-CM

## 2020-11-25 DIAGNOSIS — M3320 Polymyositis, organ involvement unspecified: Secondary | ICD-10-CM

## 2020-11-26 LAB — CBC WITH DIFFERENTIAL/PLATELET
Absolute Monocytes: 486 cells/uL (ref 200–950)
Basophils Absolute: 30 cells/uL (ref 0–200)
Basophils Relative: 0.5 %
Eosinophils Absolute: 30 cells/uL (ref 15–500)
Eosinophils Relative: 0.5 %
HCT: 36.2 % (ref 35.0–45.0)
Hemoglobin: 11.8 g/dL (ref 11.7–15.5)
Lymphs Abs: 1632 cells/uL (ref 850–3900)
MCH: 28.9 pg (ref 27.0–33.0)
MCHC: 32.6 g/dL (ref 32.0–36.0)
MCV: 88.5 fL (ref 80.0–100.0)
MPV: 9.7 fL (ref 7.5–12.5)
Monocytes Relative: 8.1 %
Neutro Abs: 3822 cells/uL (ref 1500–7800)
Neutrophils Relative %: 63.7 %
Platelets: 239 10*3/uL (ref 140–400)
RBC: 4.09 10*6/uL (ref 3.80–5.10)
RDW: 12.3 % (ref 11.0–15.0)
Total Lymphocyte: 27.2 %
WBC: 6 10*3/uL (ref 3.8–10.8)

## 2020-11-26 LAB — COMPLETE METABOLIC PANEL WITH GFR
AG Ratio: 1.3 (calc) (ref 1.0–2.5)
ALT: 11 U/L (ref 6–29)
AST: 19 U/L (ref 10–35)
Albumin: 3.5 g/dL — ABNORMAL LOW (ref 3.6–5.1)
Alkaline phosphatase (APISO): 49 U/L (ref 37–153)
BUN/Creatinine Ratio: 9 (calc) (ref 6–22)
BUN: 13 mg/dL (ref 7–25)
CO2: 28 mmol/L (ref 20–32)
Calcium: 8.9 mg/dL (ref 8.6–10.4)
Chloride: 107 mmol/L (ref 98–110)
Creat: 1.51 mg/dL — ABNORMAL HIGH (ref 0.50–1.03)
Globulin: 2.6 g/dL (calc) (ref 1.9–3.7)
Glucose, Bld: 77 mg/dL (ref 65–99)
Potassium: 3.9 mmol/L (ref 3.5–5.3)
Sodium: 139 mmol/L (ref 135–146)
Total Bilirubin: 0.3 mg/dL (ref 0.2–1.2)
Total Protein: 6.1 g/dL (ref 6.1–8.1)
eGFR: 41 mL/min/{1.73_m2} — ABNORMAL LOW (ref >=60)

## 2020-11-26 LAB — CK: Total CK: 248 U/L — ABNORMAL HIGH (ref 29–143)

## 2020-11-26 NOTE — Progress Notes (Signed)
Creatinine is elevated.  Muscle enzymes are mildly elevated at 248.  CBC is normal.  We will continue to monitor labs.  Please forward results to Dr. Moshe Cipro.

## 2020-11-29 ENCOUNTER — Ambulatory Visit: Payer: BC Managed Care – PPO | Admitting: Physician Assistant

## 2020-11-29 ENCOUNTER — Other Ambulatory Visit: Payer: Self-pay

## 2020-11-29 ENCOUNTER — Encounter: Payer: Self-pay | Admitting: Physician Assistant

## 2020-11-29 VITALS — BP 116/75 | HR 89 | Ht 68.0 in | Wt 225.6 lb

## 2020-11-29 DIAGNOSIS — Z79899 Other long term (current) drug therapy: Secondary | ICD-10-CM

## 2020-11-29 DIAGNOSIS — M3501 Sicca syndrome with keratoconjunctivitis: Secondary | ICD-10-CM

## 2020-11-29 DIAGNOSIS — I7300 Raynaud's syndrome without gangrene: Secondary | ICD-10-CM

## 2020-11-29 DIAGNOSIS — M8589 Other specified disorders of bone density and structure, multiple sites: Secondary | ICD-10-CM

## 2020-11-29 DIAGNOSIS — Z7952 Long term (current) use of systemic steroids: Secondary | ICD-10-CM | POA: Diagnosis not present

## 2020-11-29 DIAGNOSIS — Z96652 Presence of left artificial knee joint: Secondary | ICD-10-CM

## 2020-11-29 DIAGNOSIS — M5136 Other intervertebral disc degeneration, lumbar region: Secondary | ICD-10-CM

## 2020-11-29 DIAGNOSIS — M19072 Primary osteoarthritis, left ankle and foot: Secondary | ICD-10-CM

## 2020-11-29 DIAGNOSIS — M3320 Polymyositis, organ involvement unspecified: Secondary | ICD-10-CM | POA: Diagnosis not present

## 2020-11-29 DIAGNOSIS — M1711 Unilateral primary osteoarthritis, right knee: Secondary | ICD-10-CM

## 2020-11-29 DIAGNOSIS — M51369 Other intervertebral disc degeneration, lumbar region without mention of lumbar back pain or lower extremity pain: Secondary | ICD-10-CM

## 2020-11-29 DIAGNOSIS — M160 Bilateral primary osteoarthritis of hip: Secondary | ICD-10-CM

## 2020-11-29 DIAGNOSIS — M19071 Primary osteoarthritis, right ankle and foot: Secondary | ICD-10-CM

## 2020-11-29 DIAGNOSIS — Z87448 Personal history of other diseases of urinary system: Secondary | ICD-10-CM

## 2020-11-29 DIAGNOSIS — Z8639 Personal history of other endocrine, nutritional and metabolic disease: Secondary | ICD-10-CM

## 2020-11-29 MED ORDER — METHOCARBAMOL 500 MG PO TABS: 500.0000 mg | ORAL_TABLET | Freq: Two times a day (BID) | ORAL | 0 refills | Status: DC | PRN

## 2020-11-29 NOTE — Patient Instructions (Addendum)
Standing Labs We placed an order today for your standing lab work.   Please have your standing labs drawn in January and every 3 months   If possible, please have your labs drawn 2 weeks prior to your appointment so that the provider can discuss your results at your appointment.  Please note that you may see your imaging and lab results in [facility] before we have reviewed them. We may be awaiting multiple results to interpret others before contacting you. Please allow our office up to 72 hours to thoroughly review all of the results before contacting the office for clarification of your results.  We have open lab daily: Monday through Thursday from 1:30-4:30 PM and Friday from 1:30-4:00 PM at the office of Dr. Bo Merino, [facility].   Please be advised, all patients with office appointments requiring lab work will take precedent over walk-in lab work.  If possible, please come for your lab work on Monday and Friday afternoons, as you may experience shorter wait times. The office is located at [address] [place], [place], [place] [address] No appointment is necessary.   Labs are drawn by [facility]. Please bring your co-pay at the time of your lab draw.  You may receive a bill from [facility] for your lab work.  If you wish to have your labs drawn at another location, please call the office 24 hours in advance to send orders.  If you have any questions regarding directions or hours of operation,  please call [phone].   As a reminder, please drink plenty of water prior to coming for your lab work. Thanks!   Raynaud's Phenomenon Raynaud's phenomenon is a condition that affects the blood vessels (arteries) that carry blood to the fingers and toes. The arteries that supply blood to the ears, lips, nipples, or the tip of the nose might also be affected. Raynaud's phenomenon causes the arteries to become narrow temporarily (spasm). As a result, the flow of blood to the  affected areas is temporarily decreased. This usually occurs in response to cold temperatures or stress. During an attack, the skin in the affected areas turns white, then blue, and finally red. A person may also feel tingling or numbness in those areas. Attacks usually last for only a brief period, and then the blood flow to the area returns to normal. In most cases, Raynaud's phenomenon does not cause serious health problems. What are the causes? In many cases, the cause of this condition is not known. The condition may occur on its own (primary Raynaud's phenomenon) or may be associated with other diseases or factors (secondary Raynaud's phenomenon). Possible causes may include: Diseases or medical conditions that damage the arteries. Injuries and repetitive actions that hurt the hands or feet. Being exposed to certain chemicals. Taking medicines that narrow the arteries. Other medical conditions, such as lupus, scleroderma, rheumatoid arthritis, thyroid problems, blood disorders, Sjogren syndrome, or atherosclerosis. What increases the risk? The following factors may make you more likely to develop this condition: Being 34-49 years old. Being female. Having a family history of Raynaud's phenomenon. Living in a cold climate. Smoking. What are the signs or symptoms? Symptoms of this condition usually occur when you are exposed to cold temperatures or when you have emotional stress. The symptoms may last for a few minutes or up to several hours. They usually affect your fingers but may also affect your toes, nipples, lips, ears, or the tip of your nose. Symptoms may include: Changes in skin  color. The skin in the affected areas will turn pale or white. The skin may then change from white to bluish to red as normal blood flow returns to the area. Numbness, tingling, or pain in the affected areas. In severe cases, symptoms may include: Skin sores. Tissues decaying and dying (gangrene). How is  this diagnosed? This condition may be diagnosed based on: Your symptoms and medical history. A physical exam. During the exam, you may be asked to put your hands in cold water to check for a reaction to cold temperature. Tests, such as: Blood tests to check for other diseases or conditions. A test to check the movement of blood through your arteries and veins (vascular ultrasound). A test in which the skin at the base of your fingernail is examined under a microscope (nailfold capillaroscopy). How is this treated? During an episode, you can take actions to help symptoms go away faster. Options include moving your arms around in a windmill pattern, warming your fingers under warm water, or placing your fingers in a warm body fold, such as your armpit. Long-term treatment for this condition often involves making lifestyle changes and taking steps to control your exposure to cold temperature. For more severe cases, medicine (calcium channel blockers) may be used to improve blood circulation. Follow these instructions at home: Avoiding cold temperatures Take these steps to avoid exposure to cold: If possible, stay indoors during cold weather. When you go outside during cold weather, dress in layers and wear mittens, a hat, a scarf, and warm footwear. Wear mittens or gloves when handling ice or frozen food. Use holders for glasses or cans containing cold drinks. Let warm water run for a while before taking a shower or bath. Warm up the car before driving in cold weather. Lifestyle If possible, avoid stressful and emotional situations. Try to find ways to manage your stress, such as: Exercise. Yoga. Meditation. Biofeedback. Do not use any products that contain nicotine or tobacco. These products include cigarettes, chewing tobacco, and vaping devices, such as e-cigarettes. If you need help quitting, ask your health care provider. Avoid secondhand smoke. Limit your use of caffeine. Switch to  decaffeinated coffee, tea, and soda. Avoid chocolate. Avoid vibrating tools and machinery. General instructions Protect your hands and feet from injuries, cuts, or bruises. Avoid wearing tight rings or wristbands. Wear loose fitting socks and comfortable, roomy shoes. Take over-the-counter and prescription medicines only as told by your health care provider. Where to find support Raynaud's Association: www.raynauds.org Where to find more information [employer] of Arthritis and Musculoskeletal and Skin Diseases: www.niams.SouthExposed.es Contact a health care provider if: Your discomfort becomes worse despite lifestyle changes. You develop sores on your fingers or toes that do not heal. You have breaks in the skin on your fingers or toes. You have a fever. You have pain or swelling in your joints. You have a rash. Your symptoms occur on only one side of your body. Get help right away if: Your fingers or toes turn black. You have severe pain in the affected areas. These symptoms may represent a serious problem that is an emergency. Do not wait to see if the symptoms will go away. Get medical help right away. Call your local emergency services (911 in the [place] Do not drive yourself to the hospital. Summary Raynaud's phenomenon is a condition that affects the arteries that carry blood to the fingers, toes, ears, lips, nipples, or the tip of the nose. In many cases, the cause of this  condition is not known. Symptoms of this condition include changes in skin color along with numbness and tingling in the affected area. Treatment for this condition includes lifestyle changes and reducing exposure to cold temperatures. Medicines may be used for severe cases of the condition. Contact your health care provider if your condition worsens despite treatment. This information is not intended to replace advice given to you by your health care provider. Make sure you discuss any questions you have with  your health care provider. Document Revised: 04/09/2020 Document Reviewed: 04/09/2020 Elsevier Patient Education  2022 [employer].

## 2020-12-23 ENCOUNTER — Other Ambulatory Visit: Payer: Self-pay | Admitting: Rheumatology

## 2020-12-23 NOTE — Telephone Encounter (Signed)
Next Visit: Due January 2023. Message sent to the front to schedule.   Last Visit: 11/29/2020  Last Fill: 08/02/2020  DX: Polymyositis  Current Dose per office note 11/29/2020: Imuran 50 mg 1 tablet by mouth twice daily   Labs: 11/25/2020 Creatinine is elevated.  Muscle enzymes are mildly elevated at 248.  CBC is normal.    Okay to refill Imuran?

## 2020-12-25 ENCOUNTER — Other Ambulatory Visit: Payer: Self-pay | Admitting: Physician Assistant

## 2021-02-03 ENCOUNTER — Other Ambulatory Visit: Payer: Self-pay | Admitting: Physician Assistant

## 2021-02-03 NOTE — Telephone Encounter (Signed)
Please schedule patient for a follow up visit. Patient due January 2023. Thanks!

## 2021-02-03 NOTE — Telephone Encounter (Signed)
Next Visit: Due January 2023. Message sent to the front to schedule patient.   Last Visit: 11/29/2020  Last Fill: 11/12/2020  Dx:  Polymyositis   Current Dose per office note on 11/29/2020: prednisone 6 mg mouth daily.   Okay to refill Prednisone?

## 2021-02-19 NOTE — Progress Notes (Signed)
Office Visit Note  Patient: Connie Brady             Date of Birth: 02/16/1971           MRN: [id number]             PCP: Kelton Pillar, MD Referring: Kelton Pillar, MD Visit Date: 03/04/2021 Occupation: @GUAROCC @  Subjective:  Medication monitoring   History of Present Illness: Connie Brady is a 51 y.o. female with history of polymyositis, sjogren's syndrome, osteoarthritis, and DDD.  She is taking imuran 50 mg 1 tablet by mouth twice daily and prednisone 6 mg daily.  She is tolerating Imuran without any side effects.  She denies any signs or symptoms of a polymyositis flare.  Over the holidays she was not quite as active as usual.  Last week she restarted her strengthening regimen.  She continues to experience intermittent myalgias and muscle tenderness.  She has not noticed any increased muscle weakness recently.  She continues to have chronic pain in the left knee which was replaced.  Over the past 2 weeks her discomfort has worsened in the left knee but she has not noticed any mechanical symptoms.  Her right knee has been doing well.  She has occasional discomfort in both shoulders especially at night when lying on her sides.  She has been performing shoulder exercises on a regular basis.  She denies any other joint pain or joint swelling at this time. She requested refills of gabapentin and lidocaine patches.  She continues to have chronic sicca symptoms which have been stable.  She uses eyedrops as needed and takes pilocarpine 5 mg 1 to 2 tablets daily for symptomatic relief.  She has been seeing the ophthalmologist on a yearly basis and the dentist every 6 months. She occasionally forgets to take her dose of Fosamax but when she takes that she has been tolerating it without any side effects.  She is taking vitamin D supplement daily.  She denies any recent falls or fractures. She has not had any recent infections.     Activities of Daily Living:  Patient reports morning  stiffness for all day. Patient Reports nocturnal pain.  Difficulty dressing/grooming: Denies Difficulty climbing stairs: Reports Difficulty getting out of chair: Reports Difficulty using hands for taps, buttons, cutlery, and/or writing: Denies  Review of Systems  Constitutional:  Positive for fatigue.  HENT:  Positive for mouth dryness and nose dryness. Negative for mouth sores.   Eyes:  Positive for itching. Negative for pain and dryness.  Respiratory:  Negative for shortness of breath and difficulty breathing.   Cardiovascular:  Negative for chest pain and palpitations.  Gastrointestinal:  Negative for blood in stool, constipation and diarrhea.  Endocrine: Negative for increased urination.  Genitourinary:  Negative for difficulty urinating.  Musculoskeletal:  Positive for joint pain, joint pain, joint swelling, myalgias, morning stiffness, muscle tenderness and myalgias.  Skin:  Negative for color change, rash and redness.  Allergic/Immunologic: Negative for susceptible to infections.  Neurological:  Positive for numbness and weakness. Negative for dizziness, headaches and memory loss.  Hematological:  Positive for bruising/bleeding tendency.  Psychiatric/Behavioral:  Negative for confusion.    PMFS History:  Patient Active Problem List   Diagnosis Date Noted   Osteoarthritis of left knee 04/07/2019   Left lateral knee pain 08/24/2017   S/P arthroscopic surgery of left knee 08/24/2017   Abnormal cervical Papanicolaou smear 08/31/2016   Obesity 08/31/2016   Premenstrual symptom 08/31/2016   Vitamin  D deficiency 08/25/2016   History of chronic kidney disease 05/07/2016   Chronic kidney disease (CKD), stage III (moderate) (HCC) 03/30/2016   High risk medication use 01/10/2016   Primary osteoarthritis of both hips 01/10/2016   Primary osteoarthritis of both knees 01/10/2016   Primary osteoarthritis of both feet 01/10/2016   Bilateral plantar fasciitis 01/10/2016   Chest pain  01/26/2013   Polymyositis ([place]) 01/26/2013   Sjoegren syndrome     Past Medical History:  Diagnosis Date   Anemia    Chronic kidney disease    stage 3   GERD (gastroesophageal reflux disease)    Polymyositis (HCC)    also neuropathy of feet   Sjoegren syndrome     Family History  Problem Relation Age of Onset   Hypertension Sister    Hypertension Brother    Diabetes Brother    Hypertension Sister    Hypertension Sister    Hypertension Brother    Diabetes Brother    Past Surgical History:  Procedure Laterality Date   BUNIONECTOMY Bilateral    KNEE ARTHROPLASTY Left 2018   meniscal tear   KNEE ARTHROSCOPY WITH LATERAL MENISECTOMY Left 08/24/2017   Procedure: LEFT KNEE ARTHROSCOPY WITH LATERAL MENISECTOMY, CHONDROPLASTY, ARTHROSCOPIC ASSISTED INTERNAL FIXATION LATERAL TIBIAL PLATEAU;  Surgeon: Sydnee Cabal, MD;  Location: [facility];  Service: Orthopedics;  Laterality: Left;   KNEE SURGERY     right   MUSCLE BIOPSY     r/thigh   PLANTAR FASCIA SURGERY Right    TOTAL KNEE ARTHROPLASTY Left 04/07/2019   Procedure: TOTAL KNEE ARTHROPLASTY;  Surgeon: Sydnee Cabal, MD;  Location: [facility];  Service: Orthopedics;  Laterality: Left;  adductor canal   TOTAL SHOULDER ARTHROPLASTY Left 01/12/2017   Social History   Social History Narrative   Not on file   Immunization History  Administered Date(s) Administered   PFIZER(Purple Top)SARS-COV-2 Vaccination 08/17/2019, 09/07/2019, 06/30/2020     Objective: Vital Signs: BP 95/61 (BP Location: Left Arm, Patient Position: Sitting, Cuff Size: Normal)    Pulse 93    Resp 15    Ht 5\' 8"  (1.727 m)    Wt 229 lb (103.9 kg)    BMI 34.82 kg/m    Physical Exam Vitals and nursing note reviewed.  Constitutional:      Appearance: She is well-developed.  HENT:     Head: Normocephalic and atraumatic.  Eyes:     Conjunctiva/sclera: Conjunctivae normal.  Cardiovascular:     Rate and Rhythm: Normal rate and regular rhythm.      Heart sounds: Normal heart sounds.  Pulmonary:     Effort: Pulmonary effort is normal.     Breath sounds: Normal breath sounds.  Abdominal:     General: Bowel sounds are normal.     Palpations: Abdomen is soft.  Musculoskeletal:     Cervical back: Normal range of motion.  Skin:    General: Skin is warm and dry.     Capillary Refill: Capillary refill takes less than 2 seconds.  Neurological:     Mental Status: She is alert and oriented to person, place, and time.  Psychiatric:        Behavior: Behavior normal.     Musculoskeletal Exam: C-spine, thoracic spine, and lumbar spine have good range of motion.  Shoulder joints have good range of motion with some discomfort bilaterally.  Full strength of upper extremities noted.  Elbow joints, wrist joints, MCPs, PIPs, DIPs have good range of motion with no synovitis.  Complete fist formation bilaterally.  Hip joints have good range of motion no groin pain.  Left knee replacement has painful range of motion with warmth.  Right knee joint has good range of motion with no warmth or effusion.  Ankle joints have good range of motion with no tenderness or joint swelling.  CDAI Exam: CDAI Score: -- Patient Global: --; Provider Global: -- Swollen: --; Tender: -- Joint Exam 03/04/2021   No joint exam has been documented for this visit   There is currently no information documented on the homunculus. Go to the Rheumatology activity and complete the homunculus joint exam.  Investigation: No additional findings.  Imaging: No results found.  Recent Labs: Lab Results  Component Value Date   WBC 6.0 11/25/2020   HGB 11.8 11/25/2020   PLT 239 11/25/2020   NA 139 11/25/2020   K 3.9 11/25/2020   CL 107 11/25/2020   CO2 28 11/25/2020   GLUCOSE 77 11/25/2020   BUN 13 11/25/2020   CREATININE 1.51 (H) 11/25/2020   BILITOT 0.3 11/25/2020   ALKPHOS 57 04/04/2019   AST 19 11/25/2020   ALT 11 11/25/2020   PROT 6.1 11/25/2020   ALBUMIN 3.7  04/04/2019   CALCIUM 8.9 11/25/2020   GFRAA 50 (L) 08/02/2020    Speciality Comments: Fosamax to start date December 17, 2019  Procedures:  No procedures performed Allergies: Lyrica [pregabalin] and Percocet [oxycodone-acetaminophen]       Assessment / Plan:     Visit Diagnoses: Polymyositis ([place]) - History of elevated CK, positive muscle biopsy, myositis panel negative, initial CK 522: She is not experiencing any signs or symptoms of a polymyositis flare.  No muscular weakness was noted on examination.  She was able to raise her arms above her head and rise from a seated position without difficulty.  Last week she restarted muscle strengthening exercises at the gym.  Overall she has clinically been doing well taking Imuran 50 mg 1 tablet twice daily and prednisone 6 mg daily.  She is tolerating both medications without any side effects.  CK was 248 on 11/25/2020.  CK will be rechecked today along with CBC and CMP.  She will remain on the current treatment regimen.  She was advised to notify us if she develops signs or symptoms of a flare.  She will follow-up in the office in 3 months.- Plan: CK  High risk medication use - Imuran 50 mg 1 tablet by mouth twice daily and prednisone 6 mg mouth daily. CBC and CMP updated on 11/25/20.  She is due to update lab work today.  Orders for CBC and CMP released. Her next lab work will be due in April and every 3 months.  Standing orders for CBC and CMP are in place. - Plan: CBC with Differential/Platelet, COMPLETE METABOLIC PANEL WITH GFR She has not had any recent infections.   Sjogren's syndrome with keratoconjunctivitis sicca (HCC) - ANA 1:40 centromere, +SSA: She continues to have chronic sicca symptoms which have been tolerable.  She uses eyedrops as needed, and she takes pilocarpine 5 mg 1 to 2 tablets daily as needed for symptomatic relief.  She sees an ophthalmologist on a yearly basis and the dentist every 6 months. No signs of inflammatory  arthritis were noted on examination today.  Raynaud's phenomenon without gangrene: Not currently active.  No digital ulcerations or signs of gangrene noted.  No signs of sclerodactyly.  On prednisone therapy -She remains on prednisone 6 mg by mouth daily.  She  is aware of the risks of long-term prednisone use. She is currently on Fosamax 70 mg 1 tablet once weekly.  Primary osteoarthritis of both hips: She has good range of motion of both hip joints on examination today with no groin pain.  S/P total knee arthroplasty, left - Bone scan performed on 08/22/2020: Focal delayed increased uptake of tracer at lateral left tibial plateau adjacent to prosthesis, question postsurgical versus aseptic loosening.  Chronic pain.  Over the past 2 weeks she has had increased discomfort in the left knee replacement.  On examination she had painful range of motion with warmth but no effusion.  Primary osteoarthritis of right knee: She has good range of motion of the right knee joint on examination today.  No warmth or effusion was noted.  Primary osteoarthritis of both feet: She is not experiencing any discomfort in her feet currently.  DDD (degenerative disc disease), lumbar: She is not experiencing any increased discomfort in her lower back at this time.  No symptoms of radiculopathy at this time.  She remains on gabapentin 300 mg 2 capsules at bedtime and uses Lidoderm patches as needed for symptomatic relief.  Refills of gabapentin and Lidoderm patches will be sent to the pharmacy today.  Osteopenia of multiple sites - DEXA updated on 11/16/19: LFN BMD 0.759 with T-score -1.3.  She has not had any recent falls or fractures.  She remains on long-term prednisone 6 mg daily. She is currently prescribed fosamax 70 mg 1 tablet by mouth once weekly and a vitamin D supplement daily. She was initially started on fosamax in October 2021.  Discussed the importance of remaining compliant taking Fosamax as prescribed.   Instructions were reviewed again today in the office for proper dosing.  She will be due to update DEXA in September 2023.   History of vitamin D deficiency: She is taking a daily vitamin D supplement.   History of chronic kidney disease - Creatinine was 1.51 and GFR was 41 on 11/25/2020. CMP with GFR will be updated today. Followed by Dr. Moshe Cipro.     Orders: Orders Placed This Encounter  Procedures   CBC with Differential/Platelet   COMPLETE METABOLIC PANEL WITH GFR   CK   No orders of the defined types were placed in this encounter.     Follow-Up Instructions: Return in about 3 months (around 06/02/2021) for Polymyositis, Sjogren's syndrome, Osteoarthritis, DDD.   Ofilia Neas, PA-C  Note - This record has been created using Dragon software.  Chart creation errors have been sought, but may not always  have been located. Such creation errors do not reflect on  the standard of medical care.

## 2021-03-04 ENCOUNTER — Ambulatory Visit: Payer: BC Managed Care – PPO | Admitting: Physician Assistant

## 2021-03-04 ENCOUNTER — Other Ambulatory Visit: Payer: Self-pay

## 2021-03-04 ENCOUNTER — Encounter: Payer: Self-pay | Admitting: Physician Assistant

## 2021-03-04 VITALS — BP 95/61 | HR 93 | Resp 15 | Ht 68.0 in | Wt 229.0 lb

## 2021-03-04 DIAGNOSIS — M3501 Sicca syndrome with keratoconjunctivitis: Secondary | ICD-10-CM | POA: Diagnosis not present

## 2021-03-04 DIAGNOSIS — M8589 Other specified disorders of bone density and structure, multiple sites: Secondary | ICD-10-CM

## 2021-03-04 DIAGNOSIS — M3320 Polymyositis, organ involvement unspecified: Secondary | ICD-10-CM | POA: Diagnosis not present

## 2021-03-04 DIAGNOSIS — Z7952 Long term (current) use of systemic steroids: Secondary | ICD-10-CM

## 2021-03-04 DIAGNOSIS — M1711 Unilateral primary osteoarthritis, right knee: Secondary | ICD-10-CM

## 2021-03-04 DIAGNOSIS — Z79899 Other long term (current) drug therapy: Secondary | ICD-10-CM | POA: Diagnosis not present

## 2021-03-04 DIAGNOSIS — Z8639 Personal history of other endocrine, nutritional and metabolic disease: Secondary | ICD-10-CM

## 2021-03-04 DIAGNOSIS — M19071 Primary osteoarthritis, right ankle and foot: Secondary | ICD-10-CM

## 2021-03-04 DIAGNOSIS — M5136 Other intervertebral disc degeneration, lumbar region: Secondary | ICD-10-CM

## 2021-03-04 DIAGNOSIS — I7300 Raynaud's syndrome without gangrene: Secondary | ICD-10-CM

## 2021-03-04 DIAGNOSIS — Z96652 Presence of left artificial knee joint: Secondary | ICD-10-CM

## 2021-03-04 DIAGNOSIS — M160 Bilateral primary osteoarthritis of hip: Secondary | ICD-10-CM

## 2021-03-04 DIAGNOSIS — M19072 Primary osteoarthritis, left ankle and foot: Secondary | ICD-10-CM

## 2021-03-04 DIAGNOSIS — Z87448 Personal history of other diseases of urinary system: Secondary | ICD-10-CM

## 2021-03-04 MED ORDER — LIDOCAINE 5 % EX PTCH: 1.0000 | MEDICATED_PATCH | CUTANEOUS | 0 refills | Status: DC

## 2021-03-04 MED ORDER — GABAPENTIN 300 MG PO CAPS: 300.0000 mg | ORAL_CAPSULE | Freq: Two times a day (BID) | ORAL | 0 refills | Status: DC

## 2021-03-04 NOTE — Telephone Encounter (Signed)
Gabapentin and lidocaine patch refills are pended. Please review and send to the pharmacy. Thanks!

## 2021-03-04 NOTE — Patient Instructions (Signed)
Shoulder Exercises Ask your health care provider which exercises are safe for you. Do exercises exactly as told by your health care provider and adjust them as directed. It is normal to feel mild stretching, pulling, tightness, or discomfort as you do these exercises. Stop right away if you feel sudden pain or your pain gets worse. Do not begin these exercises until told by your health care provider. Stretching exercises External rotation and abduction This exercise is sometimes called corner stretch. This exercise rotates your arm outward (external rotation) and moves your arm out from your body (abduction). Stand in a doorway with one of your feet slightly in front of the other. This is called a staggered stance. If you cannot reach your forearms to the door frame, stand facing a corner of a room. Choose one of the following positions as told by your health care provider: Place your hands and forearms on the door frame above your head. Place your hands and forearms on the door frame at the height of your head. Place your hands on the door frame at the height of your elbows. Slowly move your weight onto your front foot until you feel a stretch across your chest and in the front of your shoulders. Keep your head and chest upright and keep your abdominal muscles tight. Hold for __________ seconds. To release the stretch, shift your weight to your back foot. Repeat __________ times. Complete this exercise __________ times a day. Extension, standing Stand and hold a broomstick, a cane, or a similar object behind your back. Your hands should be a little wider than shoulder width apart. Your palms should face away from your back. Keeping your elbows straight and your shoulder muscles relaxed, move the stick away from your body until you feel a stretch in your shoulders (extension). Avoid shrugging your shoulders while you move the stick. Keep your shoulder blades tucked down toward the middle of your  back. Hold for __________ seconds. Slowly return to the starting position. Repeat __________ times. Complete this exercise __________ times a day. Range-of-motion exercises Pendulum  Stand near a wall or a surface that you can hold onto for balance. Bend at the waist and let your left / right arm hang straight down. Use your other arm to support you. Keep your back straight and do not lock your knees. Relax your left / right arm and shoulder muscles, and move your hips and your trunk so your left / right arm swings freely. Your arm should swing because of the motion of your body, not because you are using your arm or shoulder muscles. Keep moving your hips and trunk so your arm swings in the following directions, as told by your health care provider: Side to side. Forward and backward. In clockwise and counterclockwise circles. Continue each motion for __________ seconds, or for as long as told by your health care provider. Slowly return to the starting position. Repeat __________ times. Complete this exercise __________ times a day. Shoulder flexion, standing  Stand and hold a broomstick, a cane, or a similar object. Place your hands a little more than shoulder width apart on the object. Your left / right hand should be palm up, and your other hand should be palm down. Keep your elbow straight and your shoulder muscles relaxed. Push the stick up with your healthy arm to raise your left / right arm in front of your body, and then over your head until you feel a stretch in your shoulder (flexion). Avoid   shrugging your shoulder while you raise your arm. Keep your shoulder blade tucked down toward the middle of your back. Hold for __________ seconds. Slowly return to the starting position. Repeat __________ times. Complete this exercise __________ times a day. Shoulder abduction, standing Stand and hold a broomstick, a cane, or a similar object. Place your hands a little more than shoulder  width apart on the object. Your left / right hand should be palm up, and your other hand should be palm down. Keep your elbow straight and your shoulder muscles relaxed. Push the object across your body toward your left / right side. Raise your left / right arm to the side of your body (abduction) until you feel a stretch in your shoulder. Do not raise your arm above shoulder height unless your health care provider tells you to do that. If directed, raise your arm over your head. Avoid shrugging your shoulder while you raise your arm. Keep your shoulder blade tucked down toward the middle of your back. Hold for __________ seconds. Slowly return to the starting position. Repeat __________ times. Complete this exercise __________ times a day. Internal rotation  Place your left / right hand behind your back, palm up. Use your other hand to dangle an exercise band, a towel, or a similar object over your shoulder. Grasp the band with your left / right hand so you are holding on to both ends. Gently pull up on the band until you feel a stretch in the front of your left / right shoulder. The movement of your arm toward the center of your body is called internal rotation. Avoid shrugging your shoulder while you raise your arm. Keep your shoulder blade tucked down toward the middle of your back. Hold for __________ seconds. Release the stretch by letting go of the band and lowering your hands. Repeat __________ times. Complete this exercise __________ times a day. Strengthening exercises External rotation  Sit in a stable chair without armrests. Secure an exercise band to a stable object at elbow height on your left / right side. Place a soft object, such as a folded towel or a small pillow, between your left / right upper arm and your body to move your elbow about 4 inches (10 cm) away from your side. Hold the end of the exercise band so it is tight and there is no slack. Keeping your elbow pressed  against the soft object, slowly move your forearm out, away from your abdomen (external rotation). Keep your body steady so only your forearm moves. Hold for __________ seconds. Slowly return to the starting position. Repeat __________ times. Complete this exercise __________ times a day. Shoulder abduction  Sit in a stable chair without armrests, or stand up. Hold a __________ weight in your left / right hand, or hold an exercise band with both hands. Start with your arms straight down and your left / right palm facing in, toward your body. Slowly lift your left / right hand out to your side (abduction). Do not lift your hand above shoulder height unless your health care provider tells you that this is safe. Keep your arms straight. Avoid shrugging your shoulder while you do this movement. Keep your shoulder blade tucked down toward the middle of your back. Hold for __________ seconds. Slowly lower your arm, and return to the starting position. Repeat __________ times. Complete this exercise __________ times a day. Shoulder extension Sit in a stable chair without armrests, or stand up. Secure an exercise band   to a stable object in front of you so it is at shoulder height. Hold one end of the exercise band in each hand. Your palms should face each other. Straighten your elbows and lift your hands up to shoulder height. Step back, away from the secured end of the exercise band, until the band is tight and there is no slack. Squeeze your shoulder blades together as you pull your hands down to the sides of your thighs (extension). Stop when your hands are straight down by your sides. Do not let your hands go behind your body. Hold for __________ seconds. Slowly return to the starting position. Repeat __________ times. Complete this exercise __________ times a day. Shoulder row Sit in a stable chair without armrests, or stand up. Secure an exercise band to a stable object in front of you so it  is at waist height. Hold one end of the exercise band in each hand. Position your palms so that your thumbs are facing the ceiling (neutral position). Bend each of your elbows to a 90-degree angle (right angle) and keep your upper arms at your sides. Step back until the band is tight and there is no slack. Slowly pull your elbows back behind you. Hold for __________ seconds. Slowly return to the starting position. Repeat __________ times. Complete this exercise __________ times a day. Shoulder press-ups  Sit in a stable chair that has armrests. Sit upright, with your feet flat on the floor. Put your hands on the armrests so your elbows are bent and your fingers are pointing forward. Your hands should be about even with the sides of your body. Push down on the armrests and use your arms to lift yourself off the chair. Straighten your elbows and lift yourself up as much as you comfortably can. Move your shoulder blades down, and avoid letting your shoulders move up toward your ears. Keep your feet on the ground. As you get stronger, your feet should support less of your body weight as you lift yourself up. Hold for __________ seconds. Slowly lower yourself back into the chair. Repeat __________ times. Complete this exercise __________ times a day. Wall push-ups  Stand so you are facing a stable wall. Your feet should be about one arm-length away from the wall. Lean forward and place your palms on the wall at shoulder height. Keep your feet flat on the floor as you bend your elbows and lean forward toward the wall. Hold for __________ seconds. Straighten your elbows to push yourself back to the starting position. Repeat __________ times. Complete this exercise __________ times a day. This information is not intended to replace advice given to you by your health care provider. Make sure you discuss any questions you have with your healthcare provider. Document Revised: 05/27/2018 Document  Reviewed: 03/04/2018 Elsevier Patient Education  2022 [employer]  

## 2021-03-05 LAB — COMPLETE METABOLIC PANEL WITH GFR
AG Ratio: 1.3 (calc) (ref 1.0–2.5)
ALT: 8 U/L (ref 6–29)
AST: 14 U/L (ref 10–35)
Albumin: 3.8 g/dL (ref 3.6–5.1)
Alkaline phosphatase (APISO): 57 U/L (ref 37–153)
BUN/Creatinine Ratio: 11 (calc) (ref 6–22)
BUN: 16 mg/dL (ref 7–25)
CO2: 29 mmol/L (ref 20–32)
Calcium: 9 mg/dL (ref 8.6–10.4)
Chloride: 104 mmol/L (ref 98–110)
Creat: 1.4 mg/dL — ABNORMAL HIGH (ref 0.50–1.03)
Globulin: 2.9 g/dL (calc) (ref 1.9–3.7)
Glucose, Bld: 96 mg/dL (ref 65–99)
Potassium: 4.1 mmol/L (ref 3.5–5.3)
Sodium: 139 mmol/L (ref 135–146)
Total Bilirubin: 0.2 mg/dL (ref 0.2–1.2)
Total Protein: 6.7 g/dL (ref 6.1–8.1)
eGFR: 45 mL/min/{1.73_m2} — ABNORMAL LOW (ref >=60)

## 2021-03-05 LAB — CBC WITH DIFFERENTIAL/PLATELET
Absolute Monocytes: 474 cells/uL (ref 200–950)
Basophils Absolute: 30 cells/uL (ref 0–200)
Basophils Relative: 0.4 %
Eosinophils Absolute: 22 cells/uL (ref 15–500)
Eosinophils Relative: 0.3 %
HCT: 39 % (ref 35.0–45.0)
Hemoglobin: 12.6 g/dL (ref 11.7–15.5)
Lymphs Abs: 1058 cells/uL (ref 850–3900)
MCH: 28.4 pg (ref 27.0–33.0)
MCHC: 32.3 g/dL (ref 32.0–36.0)
MCV: 88 fL (ref 80.0–100.0)
MPV: 9.7 fL (ref 7.5–12.5)
Monocytes Relative: 6.4 %
Neutro Abs: 5816 cells/uL (ref 1500–7800)
Neutrophils Relative %: 78.6 %
Platelets: 222 10*3/uL (ref 140–400)
RBC: 4.43 10*6/uL (ref 3.80–5.10)
RDW: 12.8 % (ref 11.0–15.0)
Total Lymphocyte: 14.3 %
WBC: 7.4 10*3/uL (ref 3.8–10.8)

## 2021-03-05 LAB — CK: Total CK: 204 U/L — ABNORMAL HIGH (ref 29–143)

## 2021-03-05 NOTE — Progress Notes (Signed)
Creatinine is elevated-1.40.  GFR is low-45. She should continue to avoid NSAIDs. Please forward results to nephrologist.  CBC WNL.  CK is borderline elevated-204 but is improving.

## 2021-03-25 ENCOUNTER — Telehealth: Payer: Self-pay | Admitting: *Deleted

## 2021-03-25 NOTE — Telephone Encounter (Signed)
Received a potential drug to drug interaction from [facility].   Possible interaction between: Gabapentin and Trazodone.  Possible Risks: Respiratory depression and sedation  Reviewed by: [name], PA-C  Recommendations: Please make patient aware of this risk  Notified patient of possible interactions between Gabapentin and Trazodone. Advised risk include: Respiratory depression and sedation. Patient expressed understanding.

## 2021-04-02 ENCOUNTER — Other Ambulatory Visit: Payer: Self-pay | Admitting: *Deleted

## 2021-04-02 MED ORDER — AZATHIOPRINE 50 MG PO TABS: ORAL_TABLET | ORAL | 0 refills | Status: DC

## 2021-04-02 NOTE — Telephone Encounter (Signed)
Refill request received via fax  Next Visit: 06/13/2021  Last Visit: 03/04/2021  Last Fill: 12/23/2020  DX: Polymyositis  Current Dose per office note 03/04/2021: Imuran 50 mg 1 tablet by mouth twice daily   Labs: 03/04/2021 Creatinine is elevated-1.40.  GFR is low-45. She should continue to avoid NSAIDs. CBC WNL.    Okay to refill Imuran?

## 2021-04-30 ENCOUNTER — Ambulatory Visit (INDEPENDENT_AMBULATORY_CARE_PROVIDER_SITE_OTHER): Payer: BC Managed Care – PPO | Admitting: Rheumatology

## 2021-04-30 ENCOUNTER — Other Ambulatory Visit: Payer: Self-pay

## 2021-04-30 ENCOUNTER — Encounter: Payer: Self-pay | Admitting: Rheumatology

## 2021-04-30 VITALS — BP 116/75 | HR 87 | Ht 68.0 in | Wt 233.2 lb

## 2021-04-30 DIAGNOSIS — M3501 Sicca syndrome with keratoconjunctivitis: Secondary | ICD-10-CM

## 2021-04-30 DIAGNOSIS — Z79899 Other long term (current) drug therapy: Secondary | ICD-10-CM | POA: Diagnosis not present

## 2021-04-30 DIAGNOSIS — M1711 Unilateral primary osteoarthritis, right knee: Secondary | ICD-10-CM

## 2021-04-30 DIAGNOSIS — M8589 Other specified disorders of bone density and structure, multiple sites: Secondary | ICD-10-CM

## 2021-04-30 DIAGNOSIS — M19071 Primary osteoarthritis, right ankle and foot: Secondary | ICD-10-CM

## 2021-04-30 DIAGNOSIS — Z96652 Presence of left artificial knee joint: Secondary | ICD-10-CM

## 2021-04-30 DIAGNOSIS — R5383 Other fatigue: Secondary | ICD-10-CM

## 2021-04-30 DIAGNOSIS — Z8639 Personal history of other endocrine, nutritional and metabolic disease: Secondary | ICD-10-CM

## 2021-04-30 DIAGNOSIS — Z87448 Personal history of other diseases of urinary system: Secondary | ICD-10-CM

## 2021-04-30 DIAGNOSIS — Z7952 Long term (current) use of systemic steroids: Secondary | ICD-10-CM | POA: Diagnosis not present

## 2021-04-30 DIAGNOSIS — M3320 Polymyositis, organ involvement unspecified: Secondary | ICD-10-CM | POA: Diagnosis not present

## 2021-04-30 DIAGNOSIS — M5136 Other intervertebral disc degeneration, lumbar region: Secondary | ICD-10-CM

## 2021-04-30 DIAGNOSIS — M79642 Pain in left hand: Secondary | ICD-10-CM

## 2021-04-30 DIAGNOSIS — M79641 Pain in right hand: Secondary | ICD-10-CM

## 2021-04-30 DIAGNOSIS — M51369 Other intervertebral disc degeneration, lumbar region without mention of lumbar back pain or lower extremity pain: Secondary | ICD-10-CM

## 2021-04-30 DIAGNOSIS — M160 Bilateral primary osteoarthritis of hip: Secondary | ICD-10-CM

## 2021-04-30 MED ORDER — AZATHIOPRINE 50 MG PO TABS
ORAL_TABLET | ORAL | 0 refills | Status: DC
Start: 2021-04-30 — End: 2021-10-06

## 2021-04-30 MED ORDER — PREDNISONE 5 MG PO TABS: ORAL_TABLET | ORAL | 0 refills | Status: DC

## 2021-04-30 NOTE — Patient Instructions (Addendum)
Take prednisone 20 mg by mouth every morning for 7 days then 15 mg every morning for 7 days then 10 mg every morning for 7 days then continue with prednisone 6 mg daily  ? ?Increase Imuran 50 mg tablet, 3 tablets daily ? ? ? ?Standing Labs ?We placed an order today for your standing lab work.  ? ?Please have your standing labs drawn in 2 weeks and every  3 months ? ?If possible, please have your labs drawn 2 weeks prior to your appointment so that the provider can discuss your results at your appointment. ? ?Please note that you may see your imaging and lab results in [facility] before we have reviewed them. ?We may be awaiting multiple results to interpret others before contacting you. ?Please allow our office up to 72 hours to thoroughly review all of the results before contacting the office for clarification of your results. ? ?We have open lab daily: ?Monday through Thursday from 1:30-4:30 PM and Friday from 1:30-4:00 PM ?at the office of Dr. Bo Merino, [facility].   ?Please be advised, all patients with office appointments requiring lab work will take precedent over walk-in lab work.  ?If possible, please come for your lab work on Monday and Friday afternoons, as you may experience shorter wait times. ?The office is located at [address], [place], [place], [place] [address] ?No appointment is necessary.   ?Labs are drawn by [facility]. Please bring your co-pay at the time of your lab draw.  You may receive a bill from [facility] for your lab work. ? ?Please note if you are on Hydroxychloroquine and and an order has been placed for a Hydroxychloroquine level, you will need to have it drawn 4 hours or more after your last dose. ? ?If you wish to have your labs drawn at another location, please call the office 24 hours in advance to send orders. ? ?If you have any questions regarding directions or hours of operation,  ?please call [phone].   ?As a reminder, please drink plenty of water prior to  coming for your lab work. Thanks!  ?

## 2021-04-30 NOTE — Progress Notes (Signed)
? ?Office Visit Note ? ?Patient: Connie Brady             ?Date of Birth: 28-May-1970           ?MRN: [id number]             ?PCP: Kelton Pillar, MD ?Referring: Kelton Pillar, MD ?Visit Date: 04/30/2021 ?Occupation: '@GUAROCC'$ @ ? ?Subjective:  ?Other (Bilateral foot and hand pain ) ? ? ?History of Present Illness: Connie Brady is a 51 y.o. female with history of polymyositis and Sjogren's.  She states she has been experiencing increased pain and discomfort in her hands and her feet.  She has been experiencing nocturnal pain.  She states the pain moves around.  She is pain in her hands was radiating up to her elbows.  She has been noticing some numbness in her hands.  She has noticed some swelling in her hands.  She denies any increased muscular weakness or tenderness.  She used to work out on a regular basis but she could not work out in the last week.  She continues to have dry eye symptoms.  She denies any rash. ? ?Activities of Daily Living:  ?Patient reports morning stiffness for all day. ?Patient Reports nocturnal pain.  ?Difficulty dressing/grooming: Reports ?Difficulty climbing stairs: Reports ?Difficulty getting out of chair: Reports ?Difficulty using hands for taps, buttons, cutlery, and/or writing: Reports ? ?Review of Systems  ?Constitutional:  Positive for fatigue.  ?HENT:  Negative for mouth sores, mouth dryness and nose dryness.   ?Eyes:  Positive for itching and dryness. Negative for pain.  ?Respiratory:  Negative for shortness of breath and difficulty breathing.   ?Cardiovascular:  Negative for chest pain and palpitations.  ?Gastrointestinal:  Negative for blood in stool, constipation and diarrhea.  ?Endocrine: Negative for increased urination.  ?Genitourinary:  Negative for difficulty urinating.  ?Musculoskeletal:  Positive for joint pain, joint pain, joint swelling, myalgias, morning stiffness, muscle tenderness and myalgias.  ?Skin:  Negative for color change, rash and redness.   ?Allergic/Immunologic: Negative for susceptible to infections.  ?Neurological:  Positive for numbness, headaches and weakness. Negative for dizziness and memory loss.  ?Hematological:  Negative for bruising/bleeding tendency.  ?Psychiatric/Behavioral:  Negative for confusion.   ? ?PMFS History:  ?Patient Active Problem List  ? Diagnosis Date Noted  ? Osteoarthritis of left knee 04/07/2019  ? Left lateral knee pain 08/24/2017  ? S/P arthroscopic surgery of left knee 08/24/2017  ? Abnormal cervical Papanicolaou smear 08/31/2016  ? Obesity 08/31/2016  ? Premenstrual symptom 08/31/2016  ? Vitamin D deficiency 08/25/2016  ? History of chronic kidney disease 05/07/2016  ? Chronic kidney disease (CKD), stage III (moderate) ([place]) 03/30/2016  ? High risk medication use 01/10/2016  ? Primary osteoarthritis of both hips 01/10/2016  ? Primary osteoarthritis of both knees 01/10/2016  ? Primary osteoarthritis of both feet 01/10/2016  ? Bilateral plantar fasciitis 01/10/2016  ? Chest pain 01/26/2013  ? Polymyositis ([place]) 01/26/2013  ? Sjoegren syndrome   ?  ?Past Medical History:  ?Diagnosis Date  ? Anemia   ? Chronic kidney disease   ? stage 3  ? GERD (gastroesophageal reflux disease)   ? Polymyositis ([place])   ? also neuropathy of feet  ? Sjoegren syndrome   ?  ?Family History  ?Problem Relation Age of Onset  ? Hypertension Sister   ? Hypertension Brother   ? Diabetes Brother   ? Hypertension Sister   ? Hypertension Sister   ? Hypertension Brother   ?  Diabetes Brother   ? ?Past Surgical History:  ?Procedure Laterality Date  ? BUNIONECTOMY Bilateral   ? KNEE ARTHROPLASTY Left 2018  ? meniscal tear  ? KNEE ARTHROSCOPY WITH LATERAL MENISECTOMY Left 08/24/2017  ? Procedure: LEFT KNEE ARTHROSCOPY WITH LATERAL MENISECTOMY, CHONDROPLASTY, ARTHROSCOPIC ASSISTED INTERNAL FIXATION LATERAL TIBIAL PLATEAU;  Surgeon: Sydnee Cabal, MD;  Location: [facility];  Service: Orthopedics;  Laterality: Left;  ? KNEE SURGERY    ? right   ? MUSCLE BIOPSY    ? r/thigh  ? PLANTAR FASCIA SURGERY Right   ? TOTAL KNEE ARTHROPLASTY Left 04/07/2019  ? Procedure: TOTAL KNEE ARTHROPLASTY;  Surgeon: Sydnee Cabal, MD;  Location: [facility];  Service: Orthopedics;  Laterality: Left;  adductor canal  ? TOTAL SHOULDER ARTHROPLASTY Left 01/12/2017  ? ?Social History  ? ?Social History Narrative  ? Not on file  ? ?Immunization History  ?Administered Date(s) Administered  ? PFIZER(Purple Top)SARS-COV-2 Vaccination 08/17/2019, 09/07/2019, 06/30/2020  ?  ? ?Objective: ?Vital Signs: BP 116/75 (BP Location: Right Arm, Patient Position: Sitting, Cuff Size: Large)   Pulse 87   Ht '5\' 8"'$  (1.727 m)   Wt 233 lb 3.2 oz (105.8 kg)   BMI 35.46 kg/m?   ? ?Physical Exam ?Vitals and nursing note reviewed.  ?Constitutional:   ?   Appearance: She is well-developed.  ?HENT:  ?   Head: Normocephalic and atraumatic.  ?Eyes:  ?   Conjunctiva/sclera: Conjunctivae normal.  ?Cardiovascular:  ?   Rate and Rhythm: Normal rate and regular rhythm.  ?   Heart sounds: Normal heart sounds.  ?Pulmonary:  ?   Effort: Pulmonary effort is normal.  ?   Breath sounds: Normal breath sounds.  ?Abdominal:  ?   General: Bowel sounds are normal.  ?   Palpations: Abdomen is soft.  ?Musculoskeletal:  ?   Cervical back: Normal range of motion.  ?Lymphadenopathy:  ?   Cervical: No cervical adenopathy.  ?Skin: ?   General: Skin is warm and dry.  ?   Capillary Refill: Capillary refill takes less than 2 seconds.  ?Neurological:  ?   Mental Status: She is alert and oriented to person, place, and time.  ?Psychiatric:     ?   Behavior: Behavior normal.  ?  ? ?Musculoskeletal Exam: C-spine was in good range of motion.  Shoulder joints, elbow joints, wrist joints with good range of motion.  She had discomfort and swelling on palpation of bilateral wrist joints.  She had tenderness over left third and fourth PIP joints.  Hip joints and knee joints in good range of motion.  She had warmth and tenderness over bilateral  ankles.  There was no tenderness over MTPs. ? ?CDAI Exam: ?CDAI Score: 7.6  ?Patient Global: 8 mm; Provider Global: 8 mm ?Swollen: 4 ; Tender: 6  ?Joint Exam 04/30/2021  ? ?   Right  Left  ?Wrist  Swollen Tender  Swollen Tender  ?PIP 3      Tender  ?PIP 4      Tender  ?Ankle  Swollen Tender  Swollen Tender  ? ? ? ?Investigation: ?No additional findings. ? ?Imaging: ?No results found. ? ?Recent Labs: ?Lab Results  ?Component Value Date  ? WBC 7.4 03/04/2021  ? HGB 12.6 03/04/2021  ? PLT 222 03/04/2021  ? NA 139 03/04/2021  ? K 4.1 03/04/2021  ? CL 104 03/04/2021  ? CO2 29 03/04/2021  ? GLUCOSE 96 03/04/2021  ? BUN 16 03/04/2021  ? CREATININE 1.40 (H)  03/04/2021  ? BILITOT 0.2 03/04/2021  ? ALKPHOS 57 04/04/2019  ? AST 14 03/04/2021  ? ALT 8 03/04/2021  ? PROT 6.7 03/04/2021  ? ALBUMIN 3.7 04/04/2019  ? CALCIUM 9.0 03/04/2021  ? GFRAA 50 (L) 08/02/2020  ? ? ?Speciality Comments: Fosamax to start date December 17, 2019 ? ?Procedures:  ?No procedures performed ?Allergies: Lyrica [pregabalin] and Percocet [oxycodone-acetaminophen]  ? ?Assessment / Plan:     ?Visit Diagnoses: Polymyositis ([place]) - History of elevated CK, positive muscle biopsy, myositis panel negative, initial CK 522:  -She denies any increased muscular weakness or tenderness.  She has been having increased pain and discomfort in her joints.  She had warmth and swelling in her bilateral wrist joints and her ankles.  She had difficulty walking.  She states she has been working out on a regular basis.  Her symptoms a started about 2 weeks ago and she has been going to the gym since then.  She is having difficulty walking to work.  She requested a note to wear tennis shoes to work.  As she had pain and inflammation in multiple joints and was having difficulty walking I discussed short-term use of prednisone taper.  She was in agreement.  I will increase her prednisone to 20 mg p.o. daily for 1 week and taper by 5 mg every week until she reaches 6 mg p.o.  daily.  Then she will go on her baseline prednisone dose of 6 mg p.o. daily.  I will also increase Imuran to 150 mg p.o. daily.  We will recheck labs in 2 weeks after increasing the dose of Imuran.  If labs are stable t

## 2021-05-01 ENCOUNTER — Ambulatory Visit: Payer: BC Managed Care – PPO | Admitting: Podiatry

## 2021-05-04 LAB — PROTEIN ELECTROPHORESIS, SERUM, WITH REFLEX
Albumin ELP: 3.4 g/dL — ABNORMAL LOW (ref 3.8–4.8)
Alpha 1: 0.4 g/dL — ABNORMAL HIGH (ref 0.2–0.3)
Alpha 2: 0.9 g/dL (ref 0.5–0.9)
Beta 2: 0.5 g/dL (ref 0.2–0.5)
Beta Globulin: 0.4 g/dL (ref 0.4–0.6)
Gamma Globulin: 1.2 g/dL (ref 0.8–1.7)
Total Protein: 6.8 g/dL (ref 6.1–8.1)

## 2021-05-04 LAB — COMPLETE METABOLIC PANEL WITH GFR
AG Ratio: 1.1 (calc) (ref 1.0–2.5)
ALT: 8 U/L (ref 6–29)
AST: 13 U/L (ref 10–35)
Albumin: 3.6 g/dL (ref 3.6–5.1)
Alkaline phosphatase (APISO): 59 U/L (ref 37–153)
BUN/Creatinine Ratio: 11 (calc) (ref 6–22)
BUN: 13 mg/dL (ref 7–25)
CO2: 24 mmol/L (ref 20–32)
Calcium: 8.9 mg/dL (ref 8.6–10.4)
Chloride: 105 mmol/L (ref 98–110)
Creat: 1.14 mg/dL — ABNORMAL HIGH (ref 0.50–1.03)
Globulin: 3.3 g/dL (calc) (ref 1.9–3.7)
Glucose, Bld: 106 mg/dL — ABNORMAL HIGH (ref 65–99)
Potassium: 4.2 mmol/L (ref 3.5–5.3)
Sodium: 138 mmol/L (ref 135–146)
Total Bilirubin: 0.3 mg/dL (ref 0.2–1.2)
Total Protein: 6.9 g/dL (ref 6.1–8.1)
eGFR: 58 mL/min/{1.73_m2} — ABNORMAL LOW (ref >=60)

## 2021-05-04 LAB — CBC WITH DIFFERENTIAL/PLATELET
Absolute Monocytes: 332 cells/uL (ref 200–950)
Basophils Absolute: 24 cells/uL (ref 0–200)
Basophils Relative: 0.3 %
Eosinophils Absolute: 0 cells/uL — ABNORMAL LOW (ref 15–500)
Eosinophils Relative: 0 %
HCT: 37.2 % (ref 35.0–45.0)
Hemoglobin: 12.4 g/dL (ref 11.7–15.5)
Lymphs Abs: 1130 cells/uL (ref 850–3900)
MCH: 28.8 pg (ref 27.0–33.0)
MCHC: 33.3 g/dL (ref 32.0–36.0)
MCV: 86.5 fL (ref 80.0–100.0)
MPV: 9.8 fL (ref 7.5–12.5)
Monocytes Relative: 4.2 %
Neutro Abs: 6415 cells/uL (ref 1500–7800)
Neutrophils Relative %: 81.2 %
Platelets: 307 10*3/uL (ref 140–400)
RBC: 4.3 10*6/uL (ref 3.80–5.10)
RDW: 12.9 % (ref 11.0–15.0)
Total Lymphocyte: 14.3 %
WBC: 7.9 10*3/uL (ref 3.8–10.8)

## 2021-05-04 LAB — ANTI-DNA ANTIBODY, DOUBLE-STRANDED: ds DNA Ab: 1 IU/mL

## 2021-05-04 LAB — C3 AND C4
C3 Complement: 139 mg/dL (ref 83–193)
C4 Complement: 38 mg/dL (ref 15–57)

## 2021-05-04 LAB — URINALYSIS, ROUTINE W REFLEX MICROSCOPIC
Bilirubin Urine: NEGATIVE
Glucose, UA: NEGATIVE
Hgb urine dipstick: NEGATIVE
Ketones, ur: NEGATIVE
Leukocytes,Ua: NEGATIVE
Nitrite: NEGATIVE
Protein, ur: NEGATIVE
Specific Gravity, Urine: 1.022 (ref 1.001–1.035)
pH: 6 (ref 5.0–8.0)

## 2021-05-04 LAB — SEDIMENTATION RATE: Sed Rate: 39 mm/h — ABNORMAL HIGH (ref 0–30)

## 2021-05-04 LAB — ANTI-NUCLEAR AB-TITER (ANA TITER)
ANA TITER: 1:1280 {titer} — ABNORMAL HIGH
ANA Titer 1: 1:80 {titer} — ABNORMAL HIGH

## 2021-05-04 LAB — ANA: Anti Nuclear Antibody (ANA): POSITIVE — AB

## 2021-05-04 LAB — RHEUMATOID FACTOR: Rheumatoid fact SerPl-aCnc: <14 IU/mL (ref <14)

## 2021-05-04 LAB — CK: Total CK: 177 U/L — ABNORMAL HIGH (ref 29–143)

## 2021-05-04 LAB — TSH: TSH: 1.05 mIU/L

## 2021-05-04 LAB — SJOGRENS SYNDROME-A EXTRACTABLE NUCLEAR ANTIBODY: SSA (Ro) (ENA) Antibody, IgG: >8 AI — AB

## 2021-05-04 LAB — CYCLIC CITRUL PEPTIDE ANTIBODY, IGG: Cyclic Citrullin Peptide Ab: <16 UNITS

## 2021-05-05 ENCOUNTER — Telehealth: Payer: Self-pay

## 2021-05-05 NOTE — Telephone Encounter (Signed)
Spoke with patient and advised Dr. Estanislado Pandy is not in the office today or tomorrow. Patient advised our first available appointment will be 05/22/2021. Patient states she is unable to wait until and will contact her foot doctor.  ?

## 2021-05-05 NOTE — Telephone Encounter (Signed)
Patient left a voicemail stating she is still having pain in her right foot and wants to know if its possible to get a cortisone injection.  Please advise. ?

## 2021-05-08 NOTE — Progress Notes (Signed)
I called patient.  She stated that she continues to have swelling in her foot.  She is a still on 20 mg of prednisone.  She has increased Imuran to 150 mg p.o. daily.  I advised patient to come at 8:00 AM tomorrow to see Lovena Le.

## 2021-05-09 ENCOUNTER — Other Ambulatory Visit: Payer: Self-pay

## 2021-05-09 ENCOUNTER — Encounter: Payer: Self-pay | Admitting: Physician Assistant

## 2021-05-09 ENCOUNTER — Ambulatory Visit: Payer: BC Managed Care – PPO | Admitting: Physician Assistant

## 2021-05-09 VITALS — BP 102/66 | HR 88 | Resp 15 | Ht 68.0 in | Wt 230.0 lb

## 2021-05-09 DIAGNOSIS — R5383 Other fatigue: Secondary | ICD-10-CM

## 2021-05-09 DIAGNOSIS — M79642 Pain in left hand: Secondary | ICD-10-CM

## 2021-05-09 DIAGNOSIS — M19071 Primary osteoarthritis, right ankle and foot: Secondary | ICD-10-CM

## 2021-05-09 DIAGNOSIS — Z79899 Other long term (current) drug therapy: Secondary | ICD-10-CM

## 2021-05-09 DIAGNOSIS — M3320 Polymyositis, organ involvement unspecified: Secondary | ICD-10-CM | POA: Diagnosis not present

## 2021-05-09 DIAGNOSIS — M8589 Other specified disorders of bone density and structure, multiple sites: Secondary | ICD-10-CM

## 2021-05-09 DIAGNOSIS — Z96652 Presence of left artificial knee joint: Secondary | ICD-10-CM

## 2021-05-09 DIAGNOSIS — M160 Bilateral primary osteoarthritis of hip: Secondary | ICD-10-CM

## 2021-05-09 DIAGNOSIS — Z87448 Personal history of other diseases of urinary system: Secondary | ICD-10-CM

## 2021-05-09 DIAGNOSIS — Z7952 Long term (current) use of systemic steroids: Secondary | ICD-10-CM | POA: Diagnosis not present

## 2021-05-09 DIAGNOSIS — M19072 Primary osteoarthritis, left ankle and foot: Secondary | ICD-10-CM

## 2021-05-09 DIAGNOSIS — M3501 Sicca syndrome with keratoconjunctivitis: Secondary | ICD-10-CM

## 2021-05-09 DIAGNOSIS — M5136 Other intervertebral disc degeneration, lumbar region: Secondary | ICD-10-CM

## 2021-05-09 DIAGNOSIS — M79641 Pain in right hand: Secondary | ICD-10-CM

## 2021-05-09 DIAGNOSIS — Z8639 Personal history of other endocrine, nutritional and metabolic disease: Secondary | ICD-10-CM

## 2021-05-09 DIAGNOSIS — M1711 Unilateral primary osteoarthritis, right knee: Secondary | ICD-10-CM

## 2021-05-09 MED ORDER — HYDROXYCHLOROQUINE SULFATE 200 MG PO TABS: 200.0000 mg | ORAL_TABLET | Freq: Two times a day (BID) | ORAL | 2 refills | Status: DC

## 2021-05-09 MED ORDER — PREDNISONE 5 MG PO TABS: 5.0000 mg | ORAL_TABLET | Freq: Every day | ORAL | 0 refills | Status: DC

## 2021-05-09 NOTE — Progress Notes (Unsigned)
Please review pended prescriptions and send to the pharmacy. Thanks!  ?

## 2021-05-09 NOTE — Progress Notes (Signed)
? ?Office Visit Note ? ?Patient: Connie Brady             ?Date of Birth: 02/29/1970           ?MRN: [id number]             ?PCP: Kelton Pillar, MD ?Referring: Kelton Pillar, MD ?Visit Date: 05/09/2021 ?Occupation: '@GUAROCC'$ @ ? ?Subjective:  ?Discuss medications  ? ?History of Present Illness: Connie Brady is a 51 y.o. female with history of polymyositis and sjogren's syndrome. She is taking imuran 50 mg 3 tablets by mouth daily and prednisone.  The dose of Imuran was increased from 2 tablets daily to 3 tablets daily at her last office visit on 04/30/2021 at which time prednisone was also increased from 6 mg daily to 20 mg daily.  She is tolerating the increased dose of Imuran without any side effects.  Up until yesterday she had not noticed any improvement in her joint pain and inflammation.  She continues to have pain and intermittent swelling in both wrists and both ankles.  She has upcoming appointment with podiatry on 05/22/2021 for further evaluation for the pain in her feet.   ? ?Activities of Daily Living:  ?Patient reports joint stiffness all day  ?Patient Reports nocturnal pain.  ?Difficulty dressing/grooming: Denies ?Difficulty climbing stairs: Reports ?Difficulty getting out of chair: Reports ?Difficulty using hands for taps, buttons, cutlery, and/or writing: Denies ? ?Review of Systems  ?Constitutional:  Positive for fatigue.  ?HENT:  Negative for mouth sores, mouth dryness and nose dryness.   ?Eyes:  Positive for dryness. Negative for pain and visual disturbance.  ?Respiratory:  Negative for cough, hemoptysis, shortness of breath and difficulty breathing.   ?Cardiovascular:  Positive for swelling in legs/feet. Negative for chest pain, palpitations and hypertension.  ?Gastrointestinal:  Negative for blood in stool, constipation and diarrhea.  ?Endocrine: Positive for cold intolerance, heat intolerance, excessive thirst and increased urination.  ?Genitourinary:  Negative for difficulty urinating  and painful urination.  ?Musculoskeletal:  Positive for joint pain, gait problem, joint pain, joint swelling, muscle weakness, morning stiffness and muscle tenderness. Negative for myalgias and myalgias.  ?Skin:  Negative for color change, pallor, rash, hair loss, nodules/bumps, skin tightness, ulcers and sensitivity to sunlight.  ?Allergic/Immunologic: Negative for susceptible to infections.  ?Neurological:  Positive for numbness and weakness. Negative for dizziness and headaches.  ?Hematological:  Positive for bruising/bleeding tendency. Negative for swollen glands.  ?Psychiatric/Behavioral:  Negative for depressed mood and sleep disturbance. The patient is not nervous/anxious.   ? ?PMFS History:  ?Patient Active Problem List  ? Diagnosis Date Noted  ? Osteoarthritis of left knee 04/07/2019  ? Left lateral knee pain 08/24/2017  ? S/P arthroscopic surgery of left knee 08/24/2017  ? Abnormal cervical Papanicolaou smear 08/31/2016  ? Obesity 08/31/2016  ? Premenstrual symptom 08/31/2016  ? Vitamin D deficiency 08/25/2016  ? History of chronic kidney disease 05/07/2016  ? Chronic kidney disease (CKD), stage III (moderate) ([place]) 03/30/2016  ? High risk medication use 01/10/2016  ? Primary osteoarthritis of both hips 01/10/2016  ? Primary osteoarthritis of both knees 01/10/2016  ? Primary osteoarthritis of both feet 01/10/2016  ? Bilateral plantar fasciitis 01/10/2016  ? Chest pain 01/26/2013  ? Polymyositis ([place]) 01/26/2013  ? Sjoegren syndrome   ?  ?Past Medical History:  ?Diagnosis Date  ? Anemia   ? Chronic kidney disease   ? stage 3  ? GERD (gastroesophageal reflux disease)   ? Polymyositis ([place])   ?  also neuropathy of feet  ? Sjoegren syndrome   ?  ?Family History  ?Problem Relation Age of Onset  ? Hypertension Sister   ? Hypertension Brother   ? Diabetes Brother   ? Hypertension Sister   ? Hypertension Sister   ? Hypertension Brother   ? Diabetes Brother   ? ?Past Surgical History:  ?Procedure Laterality Date  ?  BUNIONECTOMY Bilateral   ? KNEE ARTHROPLASTY Left 2018  ? meniscal tear  ? KNEE ARTHROSCOPY WITH LATERAL MENISECTOMY Left 08/24/2017  ? Procedure: LEFT KNEE ARTHROSCOPY WITH LATERAL MENISECTOMY, CHONDROPLASTY, ARTHROSCOPIC ASSISTED INTERNAL FIXATION LATERAL TIBIAL PLATEAU;  Surgeon: Sydnee Cabal, MD;  Location: [facility];  Service: Orthopedics;  Laterality: Left;  ? KNEE SURGERY    ? right  ? MUSCLE BIOPSY    ? r/thigh  ? PLANTAR FASCIA SURGERY Right   ? TOTAL KNEE ARTHROPLASTY Left 04/07/2019  ? Procedure: TOTAL KNEE ARTHROPLASTY;  Surgeon: Sydnee Cabal, MD;  Location: [facility];  Service: Orthopedics;  Laterality: Left;  adductor canal  ? TOTAL SHOULDER ARTHROPLASTY Left 01/12/2017  ? ?Social History  ? ?Social History Narrative  ? Not on file  ? ?Immunization History  ?Administered Date(s) Administered  ? PFIZER(Purple Top)SARS-COV-2 Vaccination 08/17/2019, 09/07/2019, 06/30/2020  ?  ? ?Objective: ?Vital Signs: BP 102/66 (BP Location: Left Arm, Patient Position: Sitting, Cuff Size: Normal)   Pulse 88   Resp 15   Ht '5\' 8"'$  (1.727 m)   Wt 230 lb (104.3 kg)   BMI 34.97 kg/m?   ? ?Physical Exam ?Vitals and nursing note reviewed.  ?Constitutional:   ?   Appearance: She is well-developed.  ?HENT:  ?   Head: Normocephalic and atraumatic.  ?Eyes:  ?   Conjunctiva/sclera: Conjunctivae normal.  ?Cardiovascular:  ?   Rate and Rhythm: Normal rate and regular rhythm.  ?   Heart sounds: Normal heart sounds.  ?Pulmonary:  ?   Effort: Pulmonary effort is normal.  ?   Breath sounds: Normal breath sounds.  ?Abdominal:  ?   General: Bowel sounds are normal.  ?   Palpations: Abdomen is soft.  ?Musculoskeletal:  ?   Cervical back: Normal range of motion.  ?Skin: ?   General: Skin is warm and dry.  ?   Capillary Refill: Capillary refill takes less than 2 seconds.  ?Neurological:  ?   Mental Status: She is alert and oriented to person, place, and time.  ?Psychiatric:     ?   Behavior: Behavior normal.  ?   ? ?Musculoskeletal Exam: C-spine has good range of motion with no discomfort.  Shoulder joints and elbow joints have good range of motion.  Some tenderness along the left elbow joint line.  She has tenderness and inflammation overlying both wrist joints.  Painful extension of the left wrist.  Tenderness over the left third and fourth PIP joints.  Hip joints have good range of motion with no discomfort.  Left knee replacement has good ROM.  Right knee has good range of motion with no warmth or effusion.  She has tenderness and warmth over both ankle joints.  No tenderness over MTP joints.  Some tenderness along the plantar fascia of both feet. ? ?CDAI Exam: ?CDAI Score: -- ?Patient Global: --; Provider Global: -- ?Swollen: --; Tender: -- ?Joint Exam 05/09/2021  ? ?No joint exam has been documented for this visit  ? ?There is currently no information documented on the homunculus. Go to the Rheumatology activity and complete the homunculus  joint exam. ? ?Investigation: ?No additional findings. ? ?Imaging: ?No results found. ? ?Recent Labs: ?Lab Results  ?Component Value Date  ? WBC 7.9 04/30/2021  ? HGB 12.4 04/30/2021  ? PLT 307 04/30/2021  ? NA 138 04/30/2021  ? K 4.2 04/30/2021  ? CL 105 04/30/2021  ? CO2 24 04/30/2021  ? GLUCOSE 106 (H) 04/30/2021  ? BUN 13 04/30/2021  ? CREATININE 1.14 (H) 04/30/2021  ? BILITOT 0.3 04/30/2021  ? ALKPHOS 57 04/04/2019  ? AST 13 04/30/2021  ? ALT 8 04/30/2021  ? PROT 6.9 04/30/2021  ? PROT 6.8 04/30/2021  ? ALBUMIN 3.7 04/04/2019  ? CALCIUM 8.9 04/30/2021  ? GFRAA 50 (L) 08/02/2020  ? ? ?Speciality Comments: Fosamax to start date December 17, 2019 ? ?Procedures:  ?No procedures performed ?Allergies: Lyrica [pregabalin] and Percocet [oxycodone-acetaminophen]  ? ?Assessment / Plan:     ?Visit Diagnoses: Polymyositis ([place]) - History of elevated CK, positive muscle biopsy, myositis panel negative, initial CK 522: She is not exhibiting any signs or symptoms of a polymyositis flare at  this time.  She has no muscular weakness or difficulty raising her arms above her head or rising from a seated position.  CK was 177 on 04/30/2021.  She will remain on Imuran as prescribed.  Plaquenil is being added on

## 2021-05-19 ENCOUNTER — Other Ambulatory Visit: Payer: Self-pay | Admitting: *Deleted

## 2021-05-19 MED ORDER — PILOCARPINE HCL 5 MG PO TABS: ORAL_TABLET | ORAL | 2 refills | Status: DC

## 2021-05-19 NOTE — Telephone Encounter (Signed)
Refill request received via fax ? ?Next Visit: 06/13/2021 ? ?Last Visit: 05/09/2021 ? ?Last Fill: 08/26/2020 ? ?Dx: Sjogren's syndrome with keratoconjunctivitis sicca  ? ?Current Dose per office note on 05/09/2021: pilocarpine 5 mg 1 to 2 tablets daily as needed for symptomatic relief. ? ?Okay to refill Pilocarpine?   ?

## 2021-05-22 ENCOUNTER — Encounter: Payer: Self-pay | Admitting: Podiatry

## 2021-05-22 ENCOUNTER — Other Ambulatory Visit: Payer: Self-pay | Admitting: Podiatry

## 2021-05-22 ENCOUNTER — Ambulatory Visit (INDEPENDENT_AMBULATORY_CARE_PROVIDER_SITE_OTHER): Payer: BC Managed Care – PPO

## 2021-05-22 ENCOUNTER — Ambulatory Visit: Payer: BC Managed Care – PPO | Admitting: Podiatry

## 2021-05-22 DIAGNOSIS — M722 Plantar fascial fibromatosis: Secondary | ICD-10-CM

## 2021-05-22 DIAGNOSIS — D2372 Other benign neoplasm of skin of left lower limb, including hip: Secondary | ICD-10-CM

## 2021-05-22 DIAGNOSIS — M7750 Other enthesopathy of unspecified foot: Secondary | ICD-10-CM

## 2021-05-22 DIAGNOSIS — M3500 Sicca syndrome, unspecified: Secondary | ICD-10-CM | POA: Insufficient documentation

## 2021-05-22 MED ORDER — TRIAMCINOLONE ACETONIDE 40 MG/ML IJ SUSP
40.0000 mg | Freq: Once | INTRAMUSCULAR | Status: AC
  Administered 2021-05-22: 40 mg

## 2021-05-24 NOTE — Progress Notes (Signed)
She presents today for plantar heel and lateral foot pain anterior ankle pain bilaterally she has been aching for about 2 years thought it was part of her polymyositis but her rheumatologist checked and it was negative.  She put her on new meds to try also takes Tylenol as needed. ? ?Objective: Vital signs are stable she is alert and oriented x3.  Pulses are palpable.  Neurologic sensorium is intact.  Deep tendon reflexes are intact.  Muscle strength is normal and symmetrical bilateral.  She does have pain on palpation medial calcaneal tubercle of the plantar fascia bilateral heels.  She also has a solitary porokeratotic lesion plantar lateral aspect of the fifth metatarsal head of the left foot only. ? ?Assessment: Plantar fasciitis bilateral.  Benign skin lesion plantar fifth met head left. ? ?Plan: Discussed etiology pathology and surgical therapies at this point injected the bilateral heels trimmed to the porokeratotic lesion and I will follow-up with her in 1 month if necessary.  May need to consider orthotics for her ?

## 2021-05-30 NOTE — Progress Notes (Signed)
? ?Office Visit Note ? ?Patient: Connie Brady             ?Date of Birth: 1970/03/29           ?MRN: [id number]             ?PCP: Kelton Pillar, MD ?Referring: Kelton Pillar, MD ?Visit Date: 06/13/2021 ?Occupation: '@GUAROCC'$ @ ? ?Subjective:  ?Medication management ? ?History of Present Illness: Connie Brady is a 51 y.o. female with polymyositis, Sjogren's and osteoarthritis.  She had a flare of inflammatory arthritis in March 2023.  At the time she was given a prednisone taper and then hydroxychloroquine was started.  She states after the prednisone taper her symptoms a started getting better.  She took Plaquenil for 2 weeks and then she was concerned about the side effects of Plaquenil and stopped the medication.  She states she has had no recurrence of joint swelling since then.  She denies any increased muscular weakness or tenderness.  She continues to have dry mouth and dry eye symptoms.  She has been experiencing some numbness in her right hand.  She denies any lower back pain today. ? ?Activities of Daily Living:  ?Patient reports morning stiffness for 4 hours.   ?Patient Denies nocturnal pain.  ?Difficulty dressing/grooming: Denies ?Difficulty climbing stairs: Reports ?Difficulty getting out of chair: Reports ?Difficulty using hands for taps, buttons, cutlery, and/or writing: Denies ? ?Review of Systems  ?Constitutional:  Positive for night sweats and weight gain. Negative for fatigue.  ?HENT:  Positive for mouth dryness. Negative for mouth sores.   ?Eyes:  Positive for dryness. Negative for photophobia, pain and visual disturbance.  ?Respiratory:  Negative for shortness of breath and difficulty breathing.   ?Cardiovascular:  Negative for chest pain and palpitations.  ?Gastrointestinal:  Negative for blood in stool, constipation and diarrhea.  ?Endocrine: Positive for increased urination.  ?Genitourinary:  Negative for difficulty urinating and vaginal dryness.  ?Musculoskeletal:  Positive for  joint pain, joint pain, myalgias, morning stiffness and myalgias.  ?Skin:  Negative for color change, rash and sensitivity to sunlight.  ?Allergic/Immunologic: Negative for susceptible to infections.  ?Neurological:  Positive for night sweats. Negative for dizziness.  ?Hematological:  Negative for bruising/bleeding tendency.  ?Psychiatric/Behavioral:  Positive for depressed mood and sleep disturbance. The patient is nervous/anxious.   ? ?PMFS History:  ?Patient Active Problem List  ? Diagnosis Date Noted  ? Sjogren syndrome, unspecified ([place]) 05/22/2021  ? Pain of left lower leg 11/15/2020  ? Osteoarthritis of left knee 04/07/2019  ? Left lateral knee pain 08/24/2017  ? S/P arthroscopic surgery of left knee 08/24/2017  ? Abnormal cervical Papanicolaou smear 08/31/2016  ? Obesity 08/31/2016  ? Premenstrual symptom 08/31/2016  ? Vitamin D deficiency 08/25/2016  ? History of chronic kidney disease 05/07/2016  ? Chronic kidney disease (CKD), stage III (moderate) ([place]) 03/30/2016  ? High risk medication use 01/10/2016  ? Primary osteoarthritis of both hips 01/10/2016  ? Primary osteoarthritis of both knees 01/10/2016  ? Primary osteoarthritis of both feet 01/10/2016  ? Bilateral plantar fasciitis 01/10/2016  ? Chest pain 01/26/2013  ? Polymyositis ([place]) 01/26/2013  ? Sjoegren syndrome   ?  ?Past Medical History:  ?Diagnosis Date  ? Anemia   ? Chronic kidney disease   ? stage 3  ? GERD (gastroesophageal reflux disease)   ? Polymyositis ([place])   ? also neuropathy of feet  ? Sjoegren syndrome   ?  ?Family History  ?Problem Relation Age of Onset  ?  Hypertension Sister   ? Hypertension Brother   ? Diabetes Brother   ? Hypertension Sister   ? Hypertension Sister   ? Hypertension Brother   ? Diabetes Brother   ? ?Past Surgical History:  ?Procedure Laterality Date  ? BUNIONECTOMY Bilateral   ? KNEE ARTHROPLASTY Left 2018  ? meniscal tear  ? KNEE ARTHROSCOPY WITH LATERAL MENISECTOMY Left 08/24/2017  ? Procedure: LEFT KNEE  ARTHROSCOPY WITH LATERAL MENISECTOMY, CHONDROPLASTY, ARTHROSCOPIC ASSISTED INTERNAL FIXATION LATERAL TIBIAL PLATEAU;  Surgeon: Sydnee Cabal, MD;  Location: [facility];  Service: Orthopedics;  Laterality: Left;  ? KNEE SURGERY    ? right  ? MUSCLE BIOPSY    ? r/thigh  ? PLANTAR FASCIA SURGERY Right   ? TOTAL KNEE ARTHROPLASTY Left 04/07/2019  ? Procedure: TOTAL KNEE ARTHROPLASTY;  Surgeon: Sydnee Cabal, MD;  Location: [facility];  Service: Orthopedics;  Laterality: Left;  adductor canal  ? TOTAL SHOULDER ARTHROPLASTY Left 01/12/2017  ? ?Social History  ? ?Social History Narrative  ? Not on file  ? ?Immunization History  ?Administered Date(s) Administered  ? PFIZER(Purple Top)SARS-COV-2 Vaccination 08/17/2019, 09/07/2019, 06/30/2020  ?  ? ?Objective: ?Vital Signs: BP 112/77 (BP Location: Left Arm, Patient Position: Sitting, Cuff Size: Large)   Pulse 87   Resp 12   Ht '5\' 8"'$  (1.727 m)   Wt 227 lb (103 kg)   BMI 34.52 kg/m?   ? ?Physical Exam ?Vitals and nursing note reviewed.  ?Constitutional:   ?   Appearance: She is well-developed.  ?HENT:  ?   Head: Normocephalic and atraumatic.  ?Eyes:  ?   Conjunctiva/sclera: Conjunctivae normal.  ?Cardiovascular:  ?   Rate and Rhythm: Normal rate and regular rhythm.  ?   Heart sounds: Normal heart sounds.  ?Pulmonary:  ?   Effort: Pulmonary effort is normal.  ?   Breath sounds: Normal breath sounds.  ?Abdominal:  ?   General: Bowel sounds are normal.  ?   Palpations: Abdomen is soft.  ?Musculoskeletal:  ?   Cervical back: Normal range of motion.  ?Lymphadenopathy:  ?   Cervical: No cervical adenopathy.  ?Skin: ?   General: Skin is warm and dry.  ?   Capillary Refill: Capillary refill takes less than 2 seconds.  ?Neurological:  ?   Mental Status: She is alert and oriented to person, place, and time.  ?Psychiatric:     ?   Behavior: Behavior normal.  ?  ? ?Musculoskeletal Exam: C-spine was in good range of motion.  Shoulder joints, elbow joints, wrist joints,  MCPs PIPs and DIPs and good range of motion with no synovitis.  Hip joints, knee joints, ankles, MTPs and PIPs with good range of motion with no synovitis.  She had no difficulty getting up from the chair.  She had no difficulty getting up from a squatting position. ? ?CDAI Exam: ?CDAI Score: -- ?Patient Global: --; Provider Global: -- ?Swollen: --; Tender: -- ?Joint Exam 06/13/2021  ? ?No joint exam has been documented for this visit  ? ?There is currently no information documented on the homunculus. Go to the Rheumatology activity and complete the homunculus joint exam. ? ?Investigation: ?No additional findings. ? ?Imaging: ?DG Foot Complete Left ? ?Result Date: 05/22/2021 ?Please see detailed radiograph report in office note. ? ?DG Foot Complete Right ? ?Result Date: 05/22/2021 ?Please see detailed radiograph report in office note.  ? ?Recent Labs: ?Lab Results  ?Component Value Date  ? WBC 7.4 06/10/2021  ? HGB 12.9  06/10/2021  ? PLT 242 06/10/2021  ? NA 139 06/10/2021  ? K 4.0 06/10/2021  ? CL 107 06/10/2021  ? CO2 25 06/10/2021  ? GLUCOSE 78 06/10/2021  ? BUN 13 06/10/2021  ? CREATININE 1.38 (H) 06/10/2021  ? BILITOT 0.5 06/10/2021  ? ALKPHOS 57 04/04/2019  ? AST 13 06/10/2021  ? ALT 9 06/10/2021  ? PROT 6.7 06/10/2021  ? ALBUMIN 3.7 04/04/2019  ? CALCIUM 9.3 06/10/2021  ? GFRAA 50 (L) 08/02/2020  ? ? ?Speciality Comments: Fosamax to start date December 17, 2019 ? ?Procedures:  ?No procedures performed ?Allergies: Lyrica [pregabalin] and Percocet [oxycodone-acetaminophen]  ? ?Assessment / Plan:     ?Visit Diagnoses: Polymyositis ([place]) - History of elevated CK, positive muscle biopsy, myositis panel negative, initial CK 522: She has been doing well on Imuran 150 mg p.o. daily.  She denies any increased muscular weakness or tenderness.  She was given a prednisone taper at the last visit due to inflammatory arthritis.  She states after the prednisone taper was finished she had no recurrence of joint pain or joint  swelling.  She denies any increased muscle weakness or tenderness.  Labs obtained on June 10, 2021 showed CK1 81.  She has been working out and [employer]. ? ?High risk medication use - Imuran 50 mg 3 tablets by mouth d

## 2021-06-02 ENCOUNTER — Other Ambulatory Visit: Payer: Self-pay | Admitting: Physician Assistant

## 2021-06-02 NOTE — Telephone Encounter (Signed)
Next Visit: 06/13/2021 ?  ?Last Visit: 05/09/2021 ?  ?Last Fill: 10/22/2020 ? ?Dx: Osteopenia of multiple sites  ? ?Current Dose per office note on 05/09/2021: fosamax 70 mg 1 tablet by mouth once weekly ? ?Labs: 04/30/2021 Eosinophils Absolute 0, Glucose 106, Creat. 1.14, GFR 58 ? ?Okay to refill Fosamax? ?

## 2021-06-10 ENCOUNTER — Other Ambulatory Visit: Payer: Self-pay | Admitting: Physician Assistant

## 2021-06-10 ENCOUNTER — Other Ambulatory Visit: Payer: Self-pay | Admitting: *Deleted

## 2021-06-10 DIAGNOSIS — Z79899 Other long term (current) drug therapy: Secondary | ICD-10-CM

## 2021-06-10 DIAGNOSIS — M3320 Polymyositis, organ involvement unspecified: Secondary | ICD-10-CM

## 2021-06-10 NOTE — Telephone Encounter (Signed)
Next Visit: 06/13/2021 ?  ?Last Visit: 05/09/2021 ?  ?Last Fill: 05/09/2021 ? ?Dx: Polymyositis ? ?Current Dose per office note on 05/09/2021: not discussed  ? ?Okay to refill Prednisone?  ?

## 2021-06-11 ENCOUNTER — Telehealth: Payer: Self-pay | Admitting: *Deleted

## 2021-06-11 DIAGNOSIS — Z79899 Other long term (current) drug therapy: Secondary | ICD-10-CM

## 2021-06-11 LAB — CBC WITH DIFFERENTIAL/PLATELET
Absolute Monocytes: 429 cells/uL (ref 200–950)
Basophils Absolute: 30 cells/uL (ref 0–200)
Basophils Relative: 0.4 %
Eosinophils Absolute: 7 cells/uL — ABNORMAL LOW (ref 15–500)
Eosinophils Relative: 0.1 %
HCT: 39.6 % (ref 35.0–45.0)
Hemoglobin: 12.9 g/dL (ref 11.7–15.5)
Lymphs Abs: 1709 cells/uL (ref 850–3900)
MCH: 28.5 pg (ref 27.0–33.0)
MCHC: 32.6 g/dL (ref 32.0–36.0)
MCV: 87.6 fL (ref 80.0–100.0)
MPV: 9.9 fL (ref 7.5–12.5)
Monocytes Relative: 5.8 %
Neutro Abs: 5224 cells/uL (ref 1500–7800)
Neutrophils Relative %: 70.6 %
Platelets: 242 10*3/uL (ref 140–400)
RBC: 4.52 10*6/uL (ref 3.80–5.10)
RDW: 12.4 % (ref 11.0–15.0)
Total Lymphocyte: 23.1 %
WBC: 7.4 10*3/uL (ref 3.8–10.8)

## 2021-06-11 LAB — COMPLETE METABOLIC PANEL WITH GFR
AG Ratio: 1.3 (calc) (ref 1.0–2.5)
ALT: 9 U/L (ref 6–29)
AST: 13 U/L (ref 10–35)
Albumin: 3.8 g/dL (ref 3.6–5.1)
Alkaline phosphatase (APISO): 55 U/L (ref 37–153)
BUN/Creatinine Ratio: 9 (calc) (ref 6–22)
BUN: 13 mg/dL (ref 7–25)
CO2: 25 mmol/L (ref 20–32)
Calcium: 9.3 mg/dL (ref 8.6–10.4)
Chloride: 107 mmol/L (ref 98–110)
Creat: 1.38 mg/dL — ABNORMAL HIGH (ref 0.50–1.03)
Globulin: 2.9 g/dL (calc) (ref 1.9–3.7)
Glucose, Bld: 78 mg/dL (ref 65–99)
Potassium: 4 mmol/L (ref 3.5–5.3)
Sodium: 139 mmol/L (ref 135–146)
Total Bilirubin: 0.5 mg/dL (ref 0.2–1.2)
Total Protein: 6.7 g/dL (ref 6.1–8.1)
eGFR: 46 mL/min/{1.73_m2} — ABNORMAL LOW (ref >=60)

## 2021-06-11 LAB — CK: Total CK: 181 U/L — ABNORMAL HIGH (ref 29–143)

## 2021-06-11 NOTE — Telephone Encounter (Signed)
-----   Message from Bo Merino, MD sent at 06/11/2021 12:48 PM EDT ----- ?CK is mildly elevated at 181 which is stable.  Creatinine is elevated at 1.38.  CBC is normal.  I am uncertain about the etiology of her elevated creatinine.  Please advise patient not to take any NSAIDs and repeat BMP with GFR in 1 week.  If she is  ?not taking any NSAIDs then we should refer her to nephrology for evaluation. ?

## 2021-06-11 NOTE — Progress Notes (Signed)
CK is mildly elevated at 181 which is stable.  Creatinine is elevated at 1.38.  CBC is normal.  I am uncertain about the etiology of her elevated creatinine.  Please advise patient not to take any NSAIDs and repeat BMP with GFR in 1 week.  If she is not taking any NSAIDs then we should refer her to nephrology for evaluation.

## 2021-06-13 ENCOUNTER — Encounter: Payer: Self-pay | Admitting: Rheumatology

## 2021-06-13 ENCOUNTER — Ambulatory Visit (INDEPENDENT_AMBULATORY_CARE_PROVIDER_SITE_OTHER): Payer: BC Managed Care – PPO

## 2021-06-13 ENCOUNTER — Ambulatory Visit: Payer: BC Managed Care – PPO | Admitting: Rheumatology

## 2021-06-13 VITALS — BP 112/77 | HR 87 | Resp 12 | Ht 68.0 in | Wt 227.0 lb

## 2021-06-13 DIAGNOSIS — M79641 Pain in right hand: Secondary | ICD-10-CM | POA: Diagnosis not present

## 2021-06-13 DIAGNOSIS — M19071 Primary osteoarthritis, right ankle and foot: Secondary | ICD-10-CM | POA: Diagnosis not present

## 2021-06-13 DIAGNOSIS — Z96652 Presence of left artificial knee joint: Secondary | ICD-10-CM

## 2021-06-13 DIAGNOSIS — M8589 Other specified disorders of bone density and structure, multiple sites: Secondary | ICD-10-CM

## 2021-06-13 DIAGNOSIS — Z79899 Other long term (current) drug therapy: Secondary | ICD-10-CM

## 2021-06-13 DIAGNOSIS — M5136 Other intervertebral disc degeneration, lumbar region: Secondary | ICD-10-CM

## 2021-06-13 DIAGNOSIS — M3501 Sicca syndrome with keratoconjunctivitis: Secondary | ICD-10-CM | POA: Diagnosis not present

## 2021-06-13 DIAGNOSIS — R5383 Other fatigue: Secondary | ICD-10-CM

## 2021-06-13 DIAGNOSIS — M160 Bilateral primary osteoarthritis of hip: Secondary | ICD-10-CM

## 2021-06-13 DIAGNOSIS — M19072 Primary osteoarthritis, left ankle and foot: Secondary | ICD-10-CM | POA: Diagnosis not present

## 2021-06-13 DIAGNOSIS — M1711 Unilateral primary osteoarthritis, right knee: Secondary | ICD-10-CM

## 2021-06-13 DIAGNOSIS — Z7952 Long term (current) use of systemic steroids: Secondary | ICD-10-CM | POA: Diagnosis not present

## 2021-06-13 DIAGNOSIS — M79642 Pain in left hand: Secondary | ICD-10-CM

## 2021-06-13 DIAGNOSIS — Z8639 Personal history of other endocrine, nutritional and metabolic disease: Secondary | ICD-10-CM

## 2021-06-13 DIAGNOSIS — M3320 Polymyositis, organ involvement unspecified: Secondary | ICD-10-CM | POA: Diagnosis not present

## 2021-06-13 DIAGNOSIS — N1831 Chronic kidney disease, stage 3a: Secondary | ICD-10-CM

## 2021-06-13 DIAGNOSIS — Z87448 Personal history of other diseases of urinary system: Secondary | ICD-10-CM

## 2021-06-13 MED ORDER — HYDROXYCHLOROQUINE SULFATE 200 MG PO TABS: 200.0000 mg | ORAL_TABLET | Freq: Every day | ORAL | 0 refills | Status: DC

## 2021-06-13 NOTE — Patient Instructions (Signed)
Take hydroxychloroquine 200 mg 1 tablet daily ? ?Please schedule an eye examination with the ophthalmologist to monitor for eye toxicity. ? ?Standing Labs ?We placed an order today for your standing lab work.  ? ?Please have your standing labs drawn in July and every 3 months ? ?If possible, please have your labs drawn 2 weeks prior to your appointment so that the provider can discuss your results at your appointment. ? ?Please note that you may see your imaging and lab results in [facility] before we have reviewed them. ?We may be awaiting multiple results to interpret others before contacting you. ?Please allow our office up to 72 hours to thoroughly review all of the results before contacting the office for clarification of your results. ? ?We have open lab daily: ?Monday through Thursday from 1:30-4:30 PM and Friday from 1:30-4:00 PM ?at the office of Dr. Bo Merino, [facility].   ?Please be advised, all patients with office appointments requiring lab work will take precedent over walk-in lab work.  ?If possible, please come for your lab work on Monday and Friday afternoons, as you may experience shorter wait times. ?The office is located at [address], [place], [place], [place] [address] ?No appointment is necessary.   ?Labs are drawn by [facility]. Please bring your co-pay at the time of your lab draw.  You may receive a bill from [facility] for your lab work. ? ?Please note if you are on Hydroxychloroquine and and an order has been placed for a Hydroxychloroquine level, you will need to have it drawn 4 hours or more after your last dose. ? ?If you wish to have your labs drawn at another location, please call the office 24 hours in advance to send orders. ? ?If you have any questions regarding directions or hours of operation,  ?please call [phone].   ?As a reminder, please drink plenty of water prior to coming for your lab work. Thanks!  ?Vaccines ?You are taking a medication(s) that can  suppress your immune system.  The following immunizations are recommended: ?Flu annually ?Covid-19  ?Td/Tdap (tetanus, diphtheria, pertussis) every 10 years ?Pneumonia (Prevnar 15 then Pneumovax 23 at least 1 year apart.  Alternatively, can take Prevnar 20 without needing additional dose) ?Shingrix: 2 doses from 4 weeks to 6 months apart ? ?Please check with your PCP to make sure you are up to date.  ? ? ?

## 2021-07-16 ENCOUNTER — Other Ambulatory Visit: Payer: Self-pay | Admitting: *Deleted

## 2021-07-16 MED ORDER — PREDNISONE 5 MG PO TABS
5.0000 mg | ORAL_TABLET | Freq: Every day | ORAL | 2 refills | Status: DC
Start: 2021-07-16 — End: 2022-03-02

## 2021-07-16 NOTE — Telephone Encounter (Signed)
Refill request received via fax  Next Visit: 09/16/2021  Last Visit: 06/13/2021  Last Fill: 05/21/2021  Dx: Polymyositis  Current Dose per office note on 06/13/2021: Prednisone 6 mg p.o. daily  Okay to refill Prednisone?

## 2021-08-09 ENCOUNTER — Other Ambulatory Visit: Payer: Self-pay | Admitting: Physician Assistant

## 2021-08-14 ENCOUNTER — Other Ambulatory Visit: Payer: Self-pay | Admitting: Rheumatology

## 2021-08-20 ENCOUNTER — Other Ambulatory Visit: Payer: Self-pay | Admitting: Physician Assistant

## 2021-08-20 NOTE — Telephone Encounter (Signed)
Next Visit: 09/16/2021   Last Visit: 06/13/2021   Last Fill: 06/02/2021  Dx: Osteopenia of multiple sites    Current Dose per office note on 06/13/2021: fosamax 70 mg 1 tablet by mouth once weekly  Labs: 06/10/2021 Creatinine is elevated at 1.38.  CBC is normal.   Okay to refill Fosamax?

## 2021-09-02 NOTE — Progress Notes (Unsigned)
Office Visit Note  Patient: Connie Brady             Date of Birth: [birth year]           MRN: [id number]             PCP: Maurice Small, MD Referring: Maurice Small, MD Visit Date: 09/16/2021 Occupation: @GUAROCC @  Subjective:  Medication monitoring   History of Present Illness: Connie Brady is a 51 y.o. female with history of polymyositis, sjogren's, and osteoarthritis. She is taking imuran 50 mg 2 tablets by mouth daily and prednisone 6 mg daily.  Patient reports for the past 1 month she has been taking 2 tablets of Imuran instead of 3 tablets daily.  She plans on increasing the dose of Imuran as previously discussed.  She states she is been having some increased pain which she describes as an aching sensation in both legs especially in her quads.  She continues to exercise 4 to 5 days/week.  She had some increased discomfort in the right knee joint but denies any joint swelling.  She states that the pain and swelling in her hands and wrist has resolved.  She denies any increased discomfort in her lower back. She continues to have chronic sicca symptoms.  She has been taking pilocarpine about once daily but thinks she needs to increase the dose since she has had increased sicca symptoms.  She continues to use Systane eyedrops on a daily basis for symptomatic relief. She denies any shortness of breath or pleuritic chest pain.  She has establish care with a healthy weight and wellness clinic.  She takes Fosamax 70 mg 1 tablet by mouth once weekly.  She occasionally misses doses of Fosamax. She denies any recent infections.     Activities of Daily Living:  Patient reports morning stiffness for all day. Patient Reports nocturnal pain.  Difficulty dressing/grooming: Denies Difficulty climbing stairs: Reports Difficulty getting out of chair: Reports Difficulty using hands for taps, buttons, cutlery, and/or writing: Denies  Review of Systems  Constitutional:  Positive for  fatigue.  HENT:  Negative for mouth sores and mouth dryness.   Eyes:  Positive for dryness.  Respiratory:  Negative for shortness of breath.   Cardiovascular:  Negative for chest pain and palpitations.  Gastrointestinal:  Negative for blood in stool, constipation and diarrhea.  Endocrine: Negative for increased urination.  Genitourinary:  Negative for involuntary urination.  Musculoskeletal:  Positive for joint pain, joint pain, myalgias, morning stiffness, muscle tenderness and myalgias. Negative for joint swelling and muscle weakness.  Skin:  Negative for color change, rash, hair loss and sensitivity to sunlight.  Allergic/Immunologic: Negative for susceptible to infections.  Neurological:  Negative for dizziness and headaches.  Hematological:  Negative for swollen glands.  Psychiatric/Behavioral:  Positive for depressed mood and sleep disturbance. The patient is nervous/anxious.     PMFS History:  Patient Active Problem List   Diagnosis Date Noted   Sjogren syndrome, unspecified (HCC) 05/22/2021   Pain of left lower leg 11/15/2020   Osteoarthritis of left knee 04/07/2019   Left lateral knee pain 08/24/2017   S/P arthroscopic surgery of left knee 08/24/2017   Abnormal cervical Papanicolaou smear 08/31/2016   Obesity 08/31/2016   Premenstrual symptom 08/31/2016   Vitamin D deficiency 08/25/2016   History of chronic kidney disease 05/07/2016   Chronic kidney disease (CKD), stage III (moderate) (HCC) 03/30/2016   High risk medication use 01/10/2016   Primary osteoarthritis of both hips 01/10/2016  Primary osteoarthritis of both knees 01/10/2016   Primary osteoarthritis of both feet 01/10/2016   Bilateral plantar fasciitis 01/10/2016   Chest pain 01/26/2013   Polymyositis ([place]) 01/26/2013   Sjoegren syndrome     Past Medical History:  Diagnosis Date   Anemia    Chronic kidney disease    stage 3   GERD (gastroesophageal reflux disease)    Polymyositis (HCC)    also  neuropathy of feet   Sjoegren syndrome     Family History  Problem Relation Age of Onset   Hypertension Sister    Hypertension Brother    Diabetes Brother    Hypertension Sister    Hypertension Sister    Hypertension Brother    Diabetes Brother    Past Surgical History:  Procedure Laterality Date   BUNIONECTOMY Bilateral    KNEE ARTHROPLASTY Left 2018   meniscal tear   KNEE ARTHROSCOPY WITH LATERAL MENISECTOMY Left 08/24/2017   Procedure: LEFT KNEE ARTHROSCOPY WITH LATERAL MENISECTOMY, CHONDROPLASTY, ARTHROSCOPIC ASSISTED INTERNAL FIXATION LATERAL TIBIAL PLATEAU;  Surgeon: Sydnee Cabal, MD;  Location: [place] [facility];  Service: Orthopedics;  Laterality: Left;   KNEE SURGERY     right   MUSCLE BIOPSY     r/thigh   PLANTAR FASCIA SURGERY Right    TOTAL KNEE ARTHROPLASTY Left 04/07/2019   Procedure: TOTAL KNEE ARTHROPLASTY;  Surgeon: Sydnee Cabal, MD;  Location: [facility];  Service: Orthopedics;  Laterality: Left;  adductor canal   TOTAL SHOULDER ARTHROPLASTY Left 01/12/2017   Social History   Social History Narrative   Not on file   Immunization History  Administered Date(s) Administered   PFIZER(Purple Top)SARS-COV-2 Vaccination 08/17/2019, 09/07/2019, 06/30/2020     Objective: Vital Signs: BP 97/67 (BP Location: Left Arm, Patient Position: Sitting, Cuff Size: Normal)   Pulse 92   Resp 15   Ht $R'5\' 8"'bO$  (1.727 m)   Wt 224 lb 9.6 oz (101.9 kg)   BMI 34.15 kg/m    Physical Exam Vitals and nursing note reviewed.  Constitutional:      Appearance: She is well-developed.  HENT:     Head: Normocephalic and atraumatic.  Eyes:     Conjunctiva/sclera: Conjunctivae normal.  Cardiovascular:     Rate and Rhythm: Normal rate and regular rhythm.     Heart sounds: Normal heart sounds.  Pulmonary:     Effort: Pulmonary effort is normal.     Breath sounds: Normal breath sounds.  Abdominal:     General: Bowel sounds are normal.     Palpations: Abdomen is soft.   Musculoskeletal:     Cervical back: Normal range of motion.  Skin:    General: Skin is warm and dry.     Capillary Refill: Capillary refill takes less than 2 seconds.  Neurological:     Mental Status: She is alert and oriented to person, place, and time.  Psychiatric:        Behavior: Behavior normal.      Musculoskeletal Exam: C-spine, thoracic spine, and lumbar spine good ROM.  Lipoma palpable left paraspinal muscular region of lumbar spine. No SI joint tenderness.  No midline spinal tenderness.  Shoulder joints have good ROM.  Elbow joints, wrist joints, MCPs, PIPs, DIPs have good range of motion with no synovitis.  She was able to make a complete fist bilaterally.  Hip joints have good range of motion with no groin pain.  Right knee has good ROM with some discomfort.  No warmth or effusion of right knee.  Left knee replacement has good ROM.  Ankle joints have good ROM with no tenderness or joint swelling.   CDAI Exam: CDAI Score: -- Patient Global: --; Provider Global: -- Swollen: --; Tender: -- Joint Exam 09/16/2021   No joint exam has been documented for this visit   There is currently no information documented on the homunculus. Go to the Rheumatology activity and complete the homunculus joint exam.  Investigation: No additional findings.  Imaging: No results found.  Recent Labs: Lab Results  Component Value Date   WBC 7.4 06/10/2021   HGB 12.9 06/10/2021   PLT 242 06/10/2021   NA 139 06/10/2021   K 4.0 06/10/2021   CL 107 06/10/2021   CO2 25 06/10/2021   GLUCOSE 78 06/10/2021   BUN 13 06/10/2021   CREATININE 1.38 (H) 06/10/2021   BILITOT 0.5 06/10/2021   ALKPHOS 57 04/04/2019   AST 13 06/10/2021   ALT 9 06/10/2021   PROT 6.7 06/10/2021   ALBUMIN 3.7 04/04/2019   CALCIUM 9.3 06/10/2021   GFRAA 50 (L) 08/02/2020    Speciality Comments: PLQ Eye Exam: 06/24/2021 WNL Follow up 1 year [place]  Fosamax to start date December 17, 2019  Procedures:  No  procedures performed Allergies: Lyrica [pregabalin] and Percocet [oxycodone-acetaminophen]     Assessment / Plan:     Visit Diagnoses: Polymyositis ([place]) - History of elevated CK, positive muscle biopsy, myositis panel negative, initial CK 522: She is not experiencing any signs or symptoms of a polymyositis flare.  She has full strength of upper and lower extremities on examination.  She continues to exercise 4 to 5 days/week.  She has had some increased discomfort in her quads while at rest follows with exertion.  CK was 181-stable on 06/10/21.  She has been taking Imuran 50 mg 2 tablets daily for the past 1 month.  She is unsure why she reduced the dose of Imuran from 3 tablets to 2 tablets daily 1 month ago but plans on increasing the dose back to 3 tablets daily.  She has remained on prednisone 6 mg daily without any gaps in therapy.  A refill of prednisone will be sent to the pharmacy. CBC, CMP, CK and ESR will be checked today.  She was advised to increase imuran back to 50 mg 3 tablets by mouth once daily.  She will follow up in 3 months or sooner if needed.  We will recheck lab work at that time.  - Plan: CK  High risk medication use - Imuran 50 mg 3 tablets by mouth daily.  Prednisone 6 mg by mouth daily.   Discontinued plaquenil due to concern for possible side effects. Aware of risks of long term prednisone use.   CBC and CMP updated on 06/10/21.  Orders for CBC and CMP released today.   CKD followed by Dr.  Moshe Cipro.  - Plan: CBC with Differential/Platelet, COMPLETE METABOLIC PANEL WITH GFR No recent or recurrent infections.    Sjogren's syndrome with keratoconjunctivitis sicca ([place]): ANA+. Ro+: She continues to have chronic sicca symptoms.  She has been taking pilocarpine 5 mg 1-2 tablets daily as well as using Systane eyedrops for symptomatic relief.  She plans on increasing the frequency of pilocarpine use up to 3 times daily as needed due to increased mouth dryness recently. She  discontinued Plaquenil prior to her last office visit due to the concern for possible side effects.  She has been taking Imuran 50 mg 2 tablets daily and remains  on prednisone 6 mg daily. She has no signs of inflammatory arthritis at this time.  She is not experiencing any shortness of breath or pleuritic chest pain. Lab work from 04/30/2021 was reviewed today in the office: ANA remains positive: ANA 1:80NS, 1:1280NC , RF negative, anti-CCP negative, SPEP did not reveal any abnormal protein bands, double-stranded ENA negative, Ro antibody positive, complements within normal limits, and ESR 39.  We will recheck the following lab work with her routine labs in 3 months.  On prednisone therapy - Prednisone 6 mg p.o. daily.  She is unable to taper prednisone below 6 mg p.o. daily. She is aware of the risks of long term prednisone use.   Pain in both hands - RF negative and anti-CCP negative on 04/30/2021.  X-rays of both hands were consistent with osteoarthritic changes on 06/13/2021.  She has no tenderness or synovitis on examination today.  The pain and swelling in her hands has completely resolved.  Primary osteoarthritis of both hips: She has good range of motion of both hip joints on examination today.  No groin pain currently.  S/P total knee arthroplasty, left - Bone scan performed on 08/22/2020: Focal delayed increased uptake of tracer at lateral left tibial plateau adjacent to prosthesis, question postsurgical versus septic loosening. No effusion was noted. Warmth but no effusion noted.   Primary osteoarthritis of right knee: She has been experiencing some increased discomfort in her right knee joint.  She has no warmth or effusion on examination today.  She has been performing lower extremity muscle strengthening exercises and continues to go to the gym 4 to 5 days/week.  Primary osteoarthritis of both feet -  X-rays of obtained are consistent with osteoarthritis.  Bilateral bunionectomy postsurgical  changes were noted.  She experiences intermittent comfort in her feet.  She is wearing proper fitting shoes.  Followed by Dr. Milinda Pointer.   DDD (degenerative disc disease), lumbar - She continues to experience intermittent discomfort in her lower back.  She takes gabapentin 300 mg 2 capsules at bedtime and Robaxin as needed for symptomatic relief.  Osteopenia of multiple sites - DEXA updated on 11/16/19: LFN BMD 0.759 with T-score -1.3.  She remains on long-term prednisone use.  She takes fosamax 70 mg 1 tablet by mouth once weekly, which was initiated in February 2022.  Future order for DEXA was placed today.- Plan: DG BONE DENSITY (DXA)  Stage 3a chronic kidney disease ([place]) - She is followed by Dr. Moshe Cipro.  Creatinine was 1.38 and GFR was 46 on 06/10/2021.  Advised to avoid the use of NSAIDs.  History of vitamin D deficiency: Vitamin D WNL 45 on 11/28/19.   Elevated sed rate -ESR was elevated-39 on 04/30/21. ESR will be rechecked today.  Plan: Sedimentation rate  Orders: Orders Placed This Encounter  Procedures   DG BONE DENSITY (DXA)   CBC with Differential/Platelet   COMPLETE METABOLIC PANEL WITH GFR   CK   Sedimentation rate   CBC with Differential/Platelet   COMPLETE METABOLIC PANEL WITH GFR   Urinalysis, Routine w reflex microscopic   C3 and C4   Sedimentation rate   Serum protein electrophoresis with reflex   Rheumatoid factor   CK   No orders of the defined types were placed in this encounter.    Follow-Up Instructions: Return in about 3 months (around 12/17/2021) for Polymyositis, Sjogren's syndrome, Osteoarthritis.   Ofilia Neas, PA-C  Note - This record has been created using Dragon software.  Chart creation errors  have been sought, but may not always  have been located. Such creation errors do not reflect on  the standard of medical care.

## 2021-09-16 ENCOUNTER — Encounter: Payer: Self-pay | Admitting: Physician Assistant

## 2021-09-16 ENCOUNTER — Other Ambulatory Visit: Payer: Self-pay

## 2021-09-16 ENCOUNTER — Ambulatory Visit: Payer: BC Managed Care – PPO | Attending: Physician Assistant | Admitting: Physician Assistant

## 2021-09-16 ENCOUNTER — Other Ambulatory Visit: Payer: Self-pay | Admitting: Rheumatology

## 2021-09-16 VITALS — BP 97/67 | HR 92 | Resp 15 | Ht 68.0 in | Wt 224.6 lb

## 2021-09-16 DIAGNOSIS — M5136 Other intervertebral disc degeneration, lumbar region: Secondary | ICD-10-CM

## 2021-09-16 DIAGNOSIS — Z79899 Other long term (current) drug therapy: Secondary | ICD-10-CM | POA: Diagnosis not present

## 2021-09-16 DIAGNOSIS — M1711 Unilateral primary osteoarthritis, right knee: Secondary | ICD-10-CM

## 2021-09-16 DIAGNOSIS — M3320 Polymyositis, organ involvement unspecified: Secondary | ICD-10-CM | POA: Diagnosis not present

## 2021-09-16 DIAGNOSIS — Z7952 Long term (current) use of systemic steroids: Secondary | ICD-10-CM

## 2021-09-16 DIAGNOSIS — Z96652 Presence of left artificial knee joint: Secondary | ICD-10-CM

## 2021-09-16 DIAGNOSIS — M79641 Pain in right hand: Secondary | ICD-10-CM

## 2021-09-16 DIAGNOSIS — M19071 Primary osteoarthritis, right ankle and foot: Secondary | ICD-10-CM

## 2021-09-16 DIAGNOSIS — M79642 Pain in left hand: Secondary | ICD-10-CM

## 2021-09-16 DIAGNOSIS — M160 Bilateral primary osteoarthritis of hip: Secondary | ICD-10-CM

## 2021-09-16 DIAGNOSIS — M8589 Other specified disorders of bone density and structure, multiple sites: Secondary | ICD-10-CM

## 2021-09-16 DIAGNOSIS — M3501 Sicca syndrome with keratoconjunctivitis: Secondary | ICD-10-CM | POA: Diagnosis not present

## 2021-09-16 DIAGNOSIS — N1831 Chronic kidney disease, stage 3a: Secondary | ICD-10-CM

## 2021-09-16 DIAGNOSIS — Z8639 Personal history of other endocrine, nutritional and metabolic disease: Secondary | ICD-10-CM

## 2021-09-16 DIAGNOSIS — M19072 Primary osteoarthritis, left ankle and foot: Secondary | ICD-10-CM

## 2021-09-16 DIAGNOSIS — R700 Elevated erythrocyte sedimentation rate: Secondary | ICD-10-CM

## 2021-09-16 MED ORDER — PREDNISONE 1 MG PO TABS: ORAL_TABLET | ORAL | 0 refills | Status: DC

## 2021-09-16 MED ORDER — PREDNISONE 5 MG PO TABS: ORAL_TABLET | ORAL | 0 refills | Status: DC

## 2021-09-16 NOTE — Progress Notes (Signed)
CBC WNL

## 2021-09-16 NOTE — Telephone Encounter (Signed)
Prednisone refills are pended. Please review and send to the pharmacy. Thanks!

## 2021-09-17 LAB — COMPLETE METABOLIC PANEL WITH GFR
AG Ratio: 1.4 (calc) (ref 1.0–2.5)
ALT: 10 U/L (ref 6–29)
AST: 15 U/L (ref 10–35)
Albumin: 3.6 g/dL (ref 3.6–5.1)
Alkaline phosphatase (APISO): 60 U/L (ref 37–153)
BUN/Creatinine Ratio: 9 (calc) (ref 6–22)
BUN: 12 mg/dL (ref 7–25)
CO2: 26 mmol/L (ref 20–32)
Calcium: 8.9 mg/dL (ref 8.6–10.4)
Chloride: 106 mmol/L (ref 98–110)
Creat: 1.35 mg/dL — ABNORMAL HIGH (ref 0.50–1.03)
Globulin: 2.6 g/dL (calc) (ref 1.9–3.7)
Glucose, Bld: 83 mg/dL (ref 65–99)
Potassium: 3.9 mmol/L (ref 3.5–5.3)
Sodium: 139 mmol/L (ref 135–146)
Total Bilirubin: 0.5 mg/dL (ref 0.2–1.2)
Total Protein: 6.2 g/dL (ref 6.1–8.1)
eGFR: 47 mL/min/{1.73_m2} — ABNORMAL LOW (ref >=60)

## 2021-09-17 LAB — CBC WITH DIFFERENTIAL/PLATELET
Absolute Monocytes: 425 cells/uL (ref 200–950)
Basophils Absolute: 20 cells/uL (ref 0–200)
Basophils Relative: 0.4 %
Eosinophils Absolute: 20 cells/uL (ref 15–500)
Eosinophils Relative: 0.4 %
HCT: 37.8 % (ref 35.0–45.0)
Hemoglobin: 12.4 g/dL (ref 11.7–15.5)
Lymphs Abs: 1510 cells/uL (ref 850–3900)
MCH: 28.8 pg (ref 27.0–33.0)
MCHC: 32.8 g/dL (ref 32.0–36.0)
MCV: 87.9 fL (ref 80.0–100.0)
MPV: 9.9 fL (ref 7.5–12.5)
Monocytes Relative: 8.5 %
Neutro Abs: 3025 cells/uL (ref 1500–7800)
Neutrophils Relative %: 60.5 %
Platelets: 221 10*3/uL (ref 140–400)
RBC: 4.3 10*6/uL (ref 3.80–5.10)
RDW: 12.7 % (ref 11.0–15.0)
Total Lymphocyte: 30.2 %
WBC: 5 10*3/uL (ref 3.8–10.8)

## 2021-09-17 LAB — CK: Total CK: 175 U/L — ABNORMAL HIGH (ref 29–143)

## 2021-09-17 LAB — SEDIMENTATION RATE: Sed Rate: 17 mm/h (ref 0–30)

## 2021-09-17 NOTE — Progress Notes (Signed)
ESR has returned to WNL. CK remains borderline elevated-175-stable.  Creatinine remains elevated-1.35 and GFR low-47. Avoid the use of NSAIDs. CBC WNL.  Please forward results to nephrologist.

## 2021-10-06 ENCOUNTER — Other Ambulatory Visit: Payer: Self-pay | Admitting: *Deleted

## 2021-10-06 MED ORDER — AZATHIOPRINE 50 MG PO TABS
ORAL_TABLET | ORAL | 0 refills | Status: DC
Start: 2021-10-06 — End: 2022-01-12

## 2021-10-06 NOTE — Telephone Encounter (Signed)
Refill request received via fax from [facility]. for Imuran   Next Visit: 12/17/2021  Last Visit: 09/16/2021  Last Fill: 04/30/2021  DX: Polymyositis  Current Dose per office note 09/16/2021:  Imuran 50 mg 3 tablets by mouth daily  Labs: 09/16/2021 CBC WNL Creatinine remains elevated-1.35 and GFR low-47.   Okay to refill Imuran?

## 2021-10-17 ENCOUNTER — Other Ambulatory Visit: Payer: Self-pay | Admitting: Physician Assistant

## 2021-11-07 ENCOUNTER — Other Ambulatory Visit: Payer: Self-pay | Admitting: Rheumatology

## 2021-11-07 NOTE — Telephone Encounter (Signed)
Next Visit: 12/17/2021   Last Visit: 09/16/2021   Last Fill: 08/11/2021  DX: Sjogren's syndrome with keratoconjunctivitis sicca    Current Dose per office note 09/16/2021: pilocarpine 5 mg 1-2 tablets daily   Okay to refill Pilocarpine?

## 2021-11-20 ENCOUNTER — Other Ambulatory Visit: Payer: Self-pay | Admitting: Physician Assistant

## 2021-11-20 NOTE — Telephone Encounter (Signed)
Duplicate prescription.

## 2021-11-20 NOTE — Telephone Encounter (Signed)
Next Visit: 12/17/2021  Last Visit: 09/16/2021  Last Fill: 08/20/2021  DX: Osteopenia of multiple sites   Current Dose per office note 09/16/2021:  fosamax 70 mg 1 tablet by mouth once weekly  Labs: 09/16/2021: ESR has returned to WNL. CK remains borderline elevated-175-stable.  Creatinine remains elevated-1.35 and GFR low-47. Avoid the use of NSAIDs. CBC WNL.   Okay to refill Fosamax?  

## 2021-11-20 NOTE — Telephone Encounter (Signed)
Next Visit: 12/17/2021  Last Visit: 09/16/2021  Last Fill: 08/20/2021  DX: Osteopenia of multiple sites   Current Dose per office note 09/16/2021: fosamax 70 mg 1 tablet by mouth once weekly  Labs: 09/16/2021 Creatinine remains elevated-1.35 and GFR low-47. Avoid the use of NSAIDs. CBC WNL.  Okay to refill Fosamax?

## 2021-11-26 ENCOUNTER — Telehealth: Payer: Self-pay | Admitting: *Deleted

## 2021-11-26 NOTE — Telephone Encounter (Signed)
Received DEXA results from [facility].  Date of Scan: 11/25/2021  Lowest T-score: -0.5  BMD:0.792  Lowest site measured:Left Femoral Neck  DX: WNL  Significant changes in BMD and site measured (5% and above):n/a  Current Regimen:Fosamax  Recommendation:WNL  Reviewed by:Dr. Bo Merino   Next Appointment:  12/17/2021

## 2021-11-26 NOTE — Telephone Encounter (Signed)
Called patient and advised that her DEXA scan is WNL. Patient expressed verbal understanding of lab results.

## 2021-12-04 NOTE — Progress Notes (Deleted)
Office Visit Note  Patient: Connie Brady             Date of Birth: [birth year]           MRN: [id number]             PCP: Kelton Pillar, MD Referring: Kelton Pillar, MD Visit Date: 12/17/2021 Occupation: '@GUAROCC'$ @  Subjective:  No chief complaint on file.   History of Present Illness: Connie Brady is a 51 y.o. female ***   Activities of Daily Living:  Patient reports morning stiffness for *** {minute/hour:19697}.   Patient {ACTIONS;DENIES/REPORTS:21021675::"Denies"} nocturnal pain.  Difficulty dressing/grooming: {ACTIONS;DENIES/REPORTS:21021675::"Denies"} Difficulty climbing stairs: {ACTIONS;DENIES/REPORTS:21021675::"Denies"} Difficulty getting out of chair: {ACTIONS;DENIES/REPORTS:21021675::"Denies"} Difficulty using hands for taps, buttons, cutlery, and/or writing: {ACTIONS;DENIES/REPORTS:21021675::"Denies"}  No Rheumatology ROS completed.   PMFS History:  Patient Active Problem List   Diagnosis Date Noted  . Sjogren syndrome, unspecified ([place]) 05/22/2021  . Pain of left lower leg 11/15/2020  . Osteoarthritis of left knee 04/07/2019  . Left lateral knee pain 08/24/2017  . S/P arthroscopic surgery of left knee 08/24/2017  . Abnormal cervical Papanicolaou smear 08/31/2016  . Obesity 08/31/2016  . Premenstrual symptom 08/31/2016  . Vitamin D deficiency 08/25/2016  . History of chronic kidney disease 05/07/2016  . Chronic kidney disease (CKD), stage III (moderate) ([place]) 03/30/2016  . High risk medication use 01/10/2016  . Primary osteoarthritis of both hips 01/10/2016  . Primary osteoarthritis of both knees 01/10/2016  . Primary osteoarthritis of both feet 01/10/2016  . Bilateral plantar fasciitis 01/10/2016  . Chest pain 01/26/2013  . Polymyositis ([place]) 01/26/2013  . Sjoegren syndrome     Past Medical History:  Diagnosis Date  . Anemia   . Chronic kidney disease    stage 3  . GERD (gastroesophageal reflux disease)   . Polymyositis ([place])    also  neuropathy of feet  . Sjoegren syndrome     Family History  Problem Relation Age of Onset  . Hypertension Sister   . Hypertension Brother   . Diabetes Brother   . Hypertension Sister   . Hypertension Sister   . Hypertension Brother   . Diabetes Brother    Past Surgical History:  Procedure Laterality Date  . BUNIONECTOMY Bilateral   . KNEE ARTHROPLASTY Left 2018   meniscal tear  . KNEE ARTHROSCOPY WITH LATERAL MENISECTOMY Left 08/24/2017   Procedure: LEFT KNEE ARTHROSCOPY WITH LATERAL MENISECTOMY, CHONDROPLASTY, ARTHROSCOPIC ASSISTED INTERNAL FIXATION LATERAL TIBIAL PLATEAU;  Surgeon: Sydnee Cabal, MD;  Location: [facility];  Service: Orthopedics;  Laterality: Left;  . KNEE SURGERY     right  . MUSCLE BIOPSY     r/thigh  . PLANTAR FASCIA SURGERY Right   . TOTAL KNEE ARTHROPLASTY Left 04/07/2019   Procedure: TOTAL KNEE ARTHROPLASTY;  Surgeon: Sydnee Cabal, MD;  Location: [facility];  Service: Orthopedics;  Laterality: Left;  adductor canal  . TOTAL SHOULDER ARTHROPLASTY Left 01/12/2017   Social History   Social History Narrative  . Not on file   Immunization History  Administered Date(s) Administered  . PFIZER(Purple Top)SARS-COV-2 Vaccination 08/17/2019, 09/07/2019, 06/30/2020     Objective: Vital Signs: There were no vitals taken for this visit.   Physical Exam   Musculoskeletal Exam: ***  CDAI Exam: CDAI Score: -- Patient Global: --; Provider Global: -- Swollen: --; Tender: -- Joint Exam 12/17/2021   No joint exam has been documented for this visit   There is currently no information documented on the homunculus. Go to the  Rheumatology activity and complete the homunculus joint exam.  Investigation: No additional findings.  Imaging: No results found.  Recent Labs: Lab Results  Component Value Date   WBC 5.0 09/16/2021   HGB 12.4 09/16/2021   PLT 221 09/16/2021   NA 139 09/16/2021   K 3.9 09/16/2021   CL 106 09/16/2021   CO2 26  09/16/2021   GLUCOSE 83 09/16/2021   BUN 12 09/16/2021   CREATININE 1.35 (H) 09/16/2021   BILITOT 0.5 09/16/2021   ALKPHOS 57 04/04/2019   AST 15 09/16/2021   ALT 10 09/16/2021   PROT 6.2 09/16/2021   ALBUMIN 3.7 04/04/2019   CALCIUM 8.9 09/16/2021   GFRAA 50 (L) 08/02/2020    Speciality Comments: PLQ Eye Exam: 06/24/2021 WNL Follow up 1 year [place]  Fosamax to start date December 17, 2019  Procedures:  No procedures performed Allergies: Lyrica [pregabalin] and Percocet [oxycodone-acetaminophen]   Assessment / Plan:     Visit Diagnoses: No diagnosis found.  Orders: No orders of the defined types were placed in this encounter.  No orders of the defined types were placed in this encounter.   Face-to-face time spent with patient was *** minutes. Greater than 50% of time was spent in counseling and coordination of care.  Follow-Up Instructions: No follow-ups on file.   Earnestine Mealing, CMA  Note - This record has been created using Editor, commissioning.  Chart creation errors have been sought, but may not always  have been located. Such creation errors do not reflect on  the standard of medical care.

## 2021-12-17 ENCOUNTER — Ambulatory Visit: Payer: BC Managed Care – PPO | Admitting: Physician Assistant

## 2021-12-17 DIAGNOSIS — M3320 Polymyositis, organ involvement unspecified: Secondary | ICD-10-CM

## 2021-12-17 DIAGNOSIS — M8589 Other specified disorders of bone density and structure, multiple sites: Secondary | ICD-10-CM

## 2021-12-17 DIAGNOSIS — Z96652 Presence of left artificial knee joint: Secondary | ICD-10-CM

## 2021-12-17 DIAGNOSIS — Z7952 Long term (current) use of systemic steroids: Secondary | ICD-10-CM

## 2021-12-17 DIAGNOSIS — Z8639 Personal history of other endocrine, nutritional and metabolic disease: Secondary | ICD-10-CM

## 2021-12-17 DIAGNOSIS — M160 Bilateral primary osteoarthritis of hip: Secondary | ICD-10-CM

## 2021-12-17 DIAGNOSIS — Z79899 Other long term (current) drug therapy: Secondary | ICD-10-CM

## 2021-12-17 DIAGNOSIS — R700 Elevated erythrocyte sedimentation rate: Secondary | ICD-10-CM

## 2021-12-17 DIAGNOSIS — M5136 Other intervertebral disc degeneration, lumbar region: Secondary | ICD-10-CM

## 2021-12-17 DIAGNOSIS — M19071 Primary osteoarthritis, right ankle and foot: Secondary | ICD-10-CM

## 2021-12-17 DIAGNOSIS — M3501 Sicca syndrome with keratoconjunctivitis: Secondary | ICD-10-CM

## 2021-12-17 DIAGNOSIS — M79642 Pain in left hand: Secondary | ICD-10-CM

## 2021-12-17 DIAGNOSIS — M1711 Unilateral primary osteoarthritis, right knee: Secondary | ICD-10-CM

## 2021-12-17 DIAGNOSIS — N1831 Chronic kidney disease, stage 3a: Secondary | ICD-10-CM

## 2021-12-17 NOTE — Progress Notes (Unsigned)
Office Visit Note  Patient: Connie Brady             Date of Birth: [birth year]           MRN: [id number]             PCP: Kelton Pillar, MD Referring: Kelton Pillar, MD Visit Date: 12/23/2021 Occupation: _0 @  Subjective:  Discuss DEXA results   History of Present Illness: Connie Brady is a 51 y.o. female with history of polymyositis, osteoarthritis, DDD, and osteopenia.  Patient remains on Imuran 50 mg 3 tablets by mouth daily. Prednisone 6 mg by mouth daily.   CK was 175 on 09/16/2021.  ESR within normal limits on 09/16/2021.  CBC and CMP were drawn on 09/16/21.  Orders for CBC and CMP were released today.  DEXA updated on 11/25/2021: LFN BMD 0.792.  T score -0.5.  Patient is currently taking Fosamax 70 mg 1 tablet by mouth once weekly.  She initiated Fosamax in February 2022.  She remains on long-term prednisone 6 mg daily.  Activities of Daily Living:  Patient reports morning stiffness for *** {minute/hour:19697}.   Patient {ACTIONS;DENIES/REPORTS:21021675::"Denies"} nocturnal pain.  Difficulty dressing/grooming: {ACTIONS;DENIES/REPORTS:21021675::"Denies"} Difficulty climbing stairs: {ACTIONS;DENIES/REPORTS:21021675::"Denies"} Difficulty getting out of chair: {ACTIONS;DENIES/REPORTS:21021675::"Denies"} Difficulty using hands for taps, buttons, cutlery, and/or writing: {ACTIONS;DENIES/REPORTS:21021675::"Denies"}  No Rheumatology ROS completed.   PMFS History:  Patient Active Problem List   Diagnosis Date Noted   Sjogren syndrome, unspecified ([place]) 05/22/2021   Pain of left lower leg 11/15/2020   Osteoarthritis of left knee 04/07/2019   Left lateral knee pain 08/24/2017   S/P arthroscopic surgery of left knee 08/24/2017   Abnormal cervical Papanicolaou smear 08/31/2016   Obesity 08/31/2016   Premenstrual symptom 08/31/2016   Vitamin D deficiency 08/25/2016   History of chronic kidney disease 05/07/2016   Chronic kidney disease (CKD), stage III (moderate)  (HCC) 03/30/2016   High risk medication use 01/10/2016   Primary osteoarthritis of both hips 01/10/2016   Primary osteoarthritis of both knees 01/10/2016   Primary osteoarthritis of both feet 01/10/2016   Bilateral plantar fasciitis 01/10/2016   Chest pain 01/26/2013   Polymyositis ([place]) 01/26/2013   Sjoegren syndrome     Past Medical History:  Diagnosis Date   Anemia    Chronic kidney disease    stage 3   GERD (gastroesophageal reflux disease)    Polymyositis (HCC)    also neuropathy of feet   Sjoegren syndrome     Family History  Problem Relation Age of Onset   Hypertension Sister    Hypertension Brother    Diabetes Brother    Hypertension Sister    Hypertension Sister    Hypertension Brother    Diabetes Brother    Past Surgical History:  Procedure Laterality Date   BUNIONECTOMY Bilateral    KNEE ARTHROPLASTY Left 2018   meniscal tear   KNEE ARTHROSCOPY WITH LATERAL MENISECTOMY Left 08/24/2017   Procedure: LEFT KNEE ARTHROSCOPY WITH LATERAL MENISECTOMY, CHONDROPLASTY, ARTHROSCOPIC ASSISTED INTERNAL FIXATION LATERAL TIBIAL PLATEAU;  Surgeon: Sydnee Cabal, MD;  Location: [facility];  Service: Orthopedics;  Laterality: Left;   KNEE SURGERY     right   MUSCLE BIOPSY     r/thigh   PLANTAR FASCIA SURGERY Right    TOTAL KNEE ARTHROPLASTY Left 04/07/2019   Procedure: TOTAL KNEE ARTHROPLASTY;  Surgeon: Sydnee Cabal, MD;  Location: [facility];  Service: Orthopedics;  Laterality: Left;  adductor canal   TOTAL SHOULDER ARTHROPLASTY Left 01/12/2017   Social  History   Social History Narrative   Not on file   Immunization History  Administered Date(s) Administered   PFIZER(Purple Top)SARS-COV-2 Vaccination 08/17/2019, 09/07/2019, 06/30/2020     Objective: Vital Signs: There were no vitals taken for this visit.   Physical Exam Vitals and nursing note reviewed.  Constitutional:      Appearance: She is well-developed.  HENT:     Head: Normocephalic and  atraumatic.  Eyes:     Conjunctiva/sclera: Conjunctivae normal.  Cardiovascular:     Rate and Rhythm: Normal rate and regular rhythm.     Heart sounds: Normal heart sounds.  Pulmonary:     Effort: Pulmonary effort is normal.     Breath sounds: Normal breath sounds.  Abdominal:     General: Bowel sounds are normal.     Palpations: Abdomen is soft.  Musculoskeletal:     Cervical back: Normal range of motion.  Skin:    General: Skin is warm and dry.     Capillary Refill: Capillary refill takes less than 2 seconds.  Neurological:     Mental Status: She is alert and oriented to person, place, and time.  Psychiatric:        Behavior: Behavior normal.      Musculoskeletal Exam: ***  CDAI Exam: CDAI Score: -- Patient Global: --; Provider Global: -- Swollen: --; Tender: -- Joint Exam 12/23/2021   No joint exam has been documented for this visit   There is currently no information documented on the homunculus. Go to the Rheumatology activity and complete the homunculus joint exam.  Investigation: No additional findings.  Imaging: No results found.  Recent Labs: Lab Results  Component Value Date   WBC 5.0 09/16/2021   HGB 12.4 09/16/2021   PLT 221 09/16/2021   NA 139 09/16/2021   K 3.9 09/16/2021   CL 106 09/16/2021   CO2 26 09/16/2021   GLUCOSE 83 09/16/2021   BUN 12 09/16/2021   CREATININE 1.35 (H) 09/16/2021   BILITOT 0.5 09/16/2021   ALKPHOS 57 04/04/2019   AST 15 09/16/2021   ALT 10 09/16/2021   PROT 6.2 09/16/2021   ALBUMIN 3.7 04/04/2019   CALCIUM 8.9 09/16/2021   GFRAA 50 (L) 08/02/2020    Speciality Comments: PLQ Eye Exam: 06/24/2021 WNL Follow up 1 year [place]  Fosamax to start date December 17, 2019  Procedures:  No procedures performed Allergies: Lyrica [pregabalin] and Percocet [oxycodone-acetaminophen]   Assessment / Plan:     Visit Diagnoses: No diagnosis found.  Orders: No orders of the defined types were placed in this  encounter.  No orders of the defined types were placed in this encounter.   Face-to-face time spent with patient was *** minutes. Greater than 50% of time was spent in counseling and coordination of care.  Follow-Up Instructions: No follow-ups on file.   Earnestine Mealing, CMA  Note - This record has been created using Editor, commissioning.  Chart creation errors have been sought, but may not always  have been located. Such creation errors do not reflect on  the standard of medical care.

## 2021-12-22 ENCOUNTER — Other Ambulatory Visit: Payer: Self-pay

## 2021-12-22 DIAGNOSIS — M3501 Sicca syndrome with keratoconjunctivitis: Secondary | ICD-10-CM

## 2021-12-22 DIAGNOSIS — Z79899 Other long term (current) drug therapy: Secondary | ICD-10-CM

## 2021-12-22 DIAGNOSIS — M3320 Polymyositis, organ involvement unspecified: Secondary | ICD-10-CM

## 2021-12-23 ENCOUNTER — Ambulatory Visit: Payer: BC Managed Care – PPO | Attending: Physician Assistant | Admitting: Physician Assistant

## 2021-12-23 ENCOUNTER — Encounter: Payer: Self-pay | Admitting: Physician Assistant

## 2021-12-23 VITALS — BP 102/67 | HR 77 | Resp 12 | Ht 68.0 in | Wt 220.0 lb

## 2021-12-23 DIAGNOSIS — M19071 Primary osteoarthritis, right ankle and foot: Secondary | ICD-10-CM

## 2021-12-23 DIAGNOSIS — M19072 Primary osteoarthritis, left ankle and foot: Secondary | ICD-10-CM

## 2021-12-23 DIAGNOSIS — M5136 Other intervertebral disc degeneration, lumbar region: Secondary | ICD-10-CM

## 2021-12-23 DIAGNOSIS — M79642 Pain in left hand: Secondary | ICD-10-CM

## 2021-12-23 DIAGNOSIS — M3320 Polymyositis, organ involvement unspecified: Secondary | ICD-10-CM

## 2021-12-23 DIAGNOSIS — M3501 Sicca syndrome with keratoconjunctivitis: Secondary | ICD-10-CM | POA: Diagnosis not present

## 2021-12-23 DIAGNOSIS — Z79899 Other long term (current) drug therapy: Secondary | ICD-10-CM

## 2021-12-23 DIAGNOSIS — Z8639 Personal history of other endocrine, nutritional and metabolic disease: Secondary | ICD-10-CM

## 2021-12-23 DIAGNOSIS — M160 Bilateral primary osteoarthritis of hip: Secondary | ICD-10-CM

## 2021-12-23 DIAGNOSIS — M8589 Other specified disorders of bone density and structure, multiple sites: Secondary | ICD-10-CM

## 2021-12-23 DIAGNOSIS — M1711 Unilateral primary osteoarthritis, right knee: Secondary | ICD-10-CM

## 2021-12-23 DIAGNOSIS — Z96652 Presence of left artificial knee joint: Secondary | ICD-10-CM

## 2021-12-23 DIAGNOSIS — M79641 Pain in right hand: Secondary | ICD-10-CM

## 2021-12-23 DIAGNOSIS — N1831 Chronic kidney disease, stage 3a: Secondary | ICD-10-CM

## 2021-12-23 DIAGNOSIS — R700 Elevated erythrocyte sedimentation rate: Secondary | ICD-10-CM

## 2021-12-23 DIAGNOSIS — Z7952 Long term (current) use of systemic steroids: Secondary | ICD-10-CM | POA: Diagnosis not present

## 2021-12-23 NOTE — Progress Notes (Signed)
Lab work will be discussed at the patients follow up visit today.

## 2021-12-24 LAB — COMPLETE METABOLIC PANEL WITH GFR
AG Ratio: 1.4 (calc) (ref 1.0–2.5)
ALT: 8 U/L (ref 6–29)
AST: 15 U/L (ref 10–35)
Albumin: 3.8 g/dL (ref 3.6–5.1)
Alkaline phosphatase (APISO): 50 U/L (ref 37–153)
BUN/Creatinine Ratio: 11 (calc) (ref 6–22)
BUN: 14 mg/dL (ref 7–25)
CO2: 28 mmol/L (ref 20–32)
Calcium: 9 mg/dL (ref 8.6–10.4)
Chloride: 105 mmol/L (ref 98–110)
Creat: 1.26 mg/dL — ABNORMAL HIGH (ref 0.50–1.03)
Globulin: 2.8 g/dL (calc) (ref 1.9–3.7)
Glucose, Bld: 121 mg/dL — ABNORMAL HIGH (ref 65–99)
Potassium: 4.3 mmol/L (ref 3.5–5.3)
Sodium: 139 mmol/L (ref 135–146)
Total Bilirubin: 0.5 mg/dL (ref 0.2–1.2)
Total Protein: 6.6 g/dL (ref 6.1–8.1)
eGFR: 51 mL/min/{1.73_m2} — ABNORMAL LOW (ref >=60)

## 2021-12-24 LAB — URINALYSIS, ROUTINE W REFLEX MICROSCOPIC
Bilirubin Urine: NEGATIVE
Glucose, UA: NEGATIVE
Hgb urine dipstick: NEGATIVE
Ketones, ur: NEGATIVE
Leukocytes,Ua: NEGATIVE
Nitrite: NEGATIVE
Protein, ur: NEGATIVE
Specific Gravity, Urine: 1.018 (ref 1.001–1.035)
pH: 6 (ref 5.0–8.0)

## 2021-12-24 LAB — C3 AND C4
C3 Complement: 112 mg/dL (ref 83–193)
C4 Complement: 29 mg/dL (ref 15–57)

## 2021-12-24 LAB — CK: Total CK: 177 U/L — ABNORMAL HIGH (ref 29–143)

## 2021-12-24 LAB — CBC WITH DIFFERENTIAL/PLATELET
Absolute Monocytes: 189 cells/uL — ABNORMAL LOW (ref 200–950)
Basophils Absolute: 18 cells/uL (ref 0–200)
Basophils Relative: 0.3 %
Eosinophils Absolute: 12 cells/uL — ABNORMAL LOW (ref 15–500)
Eosinophils Relative: 0.2 %
HCT: 36.4 % (ref 35.0–45.0)
Hemoglobin: 12 g/dL (ref 11.7–15.5)
Lymphs Abs: 696 cells/uL — ABNORMAL LOW (ref 850–3900)
MCH: 29.1 pg (ref 27.0–33.0)
MCHC: 33 g/dL (ref 32.0–36.0)
MCV: 88.1 fL (ref 80.0–100.0)
MPV: 9.8 fL (ref 7.5–12.5)
Monocytes Relative: 3.2 %
Neutro Abs: 4986 cells/uL (ref 1500–7800)
Neutrophils Relative %: 84.5 %
Platelets: 241 10*3/uL (ref 140–400)
RBC: 4.13 10*6/uL (ref 3.80–5.10)
RDW: 13.2 % (ref 11.0–15.0)
Total Lymphocyte: 11.8 %
WBC: 5.9 10*3/uL (ref 3.8–10.8)

## 2021-12-24 LAB — SEDIMENTATION RATE: Sed Rate: 36 mm/h — ABNORMAL HIGH (ref 0–30)

## 2021-12-24 LAB — PROTEIN ELECTROPHORESIS, SERUM, WITH REFLEX
Albumin ELP: 3.7 g/dL — ABNORMAL LOW (ref 3.8–4.8)
Alpha 1: 0.3 g/dL (ref 0.2–0.3)
Alpha 2: 0.6 g/dL (ref 0.5–0.9)
Beta 2: 0.4 g/dL (ref 0.2–0.5)
Beta Globulin: 0.4 g/dL (ref 0.4–0.6)
Gamma Globulin: 1.1 g/dL (ref 0.8–1.7)
Total Protein: 6.5 g/dL (ref 6.1–8.1)

## 2021-12-24 LAB — RHEUMATOID FACTOR: Rheumatoid fact SerPl-aCnc: <14 IU/mL (ref <14)

## 2021-12-24 NOTE — Progress Notes (Signed)
SPEP did not reveal any abnormal protein bands.  Please notify the patient-lab had not resulted prior to her appt today.

## 2021-12-30 ENCOUNTER — Other Ambulatory Visit: Payer: Self-pay | Admitting: Physician Assistant

## 2021-12-31 NOTE — Telephone Encounter (Signed)
Next Visit: 03/25/2022  Last Visit: 12/23/2021  Last Fill: 09/16/2021  Dx: Polymyositis   Current Dose per office note on 12/23/2021: prednisone 6 mg by mouth daily and is unable to taper   Okay to refill Prednisone?

## 2022-01-12 ENCOUNTER — Other Ambulatory Visit: Payer: Self-pay | Admitting: Physician Assistant

## 2022-01-12 NOTE — Telephone Encounter (Signed)
Next Visit: 03/25/2022  Last Visit: 12/23/2021  Last Fill: 10/06/2021  DX: Polymyositis   Current Dose per office note 12/23/2021: Imuran 50 mg 3 tablets by mouth daily.   Labs: 12/22/2021 Lypmhs 696, Absolute Monocytes 189, Eosinophils Absolute 12, Glucose 121, Creat. 1.26, GFR 51  Okay to refill Imuran?

## 2022-01-15 ENCOUNTER — Encounter: Payer: Self-pay | Admitting: Physician Assistant

## 2022-01-15 ENCOUNTER — Ambulatory Visit: Payer: BC Managed Care – PPO | Attending: Physician Assistant | Admitting: Physician Assistant

## 2022-01-15 VITALS — BP 119/80 | HR 81 | Resp 17 | Ht 68.0 in | Wt 219.4 lb

## 2022-01-15 DIAGNOSIS — R700 Elevated erythrocyte sedimentation rate: Secondary | ICD-10-CM

## 2022-01-15 DIAGNOSIS — Z8639 Personal history of other endocrine, nutritional and metabolic disease: Secondary | ICD-10-CM

## 2022-01-15 DIAGNOSIS — M19071 Primary osteoarthritis, right ankle and foot: Secondary | ICD-10-CM

## 2022-01-15 DIAGNOSIS — Z7952 Long term (current) use of systemic steroids: Secondary | ICD-10-CM | POA: Diagnosis not present

## 2022-01-15 DIAGNOSIS — M3501 Sicca syndrome with keratoconjunctivitis: Secondary | ICD-10-CM | POA: Diagnosis not present

## 2022-01-15 DIAGNOSIS — M160 Bilateral primary osteoarthritis of hip: Secondary | ICD-10-CM

## 2022-01-15 DIAGNOSIS — Z79899 Other long term (current) drug therapy: Secondary | ICD-10-CM | POA: Diagnosis not present

## 2022-01-15 DIAGNOSIS — M19072 Primary osteoarthritis, left ankle and foot: Secondary | ICD-10-CM

## 2022-01-15 DIAGNOSIS — M8589 Other specified disorders of bone density and structure, multiple sites: Secondary | ICD-10-CM

## 2022-01-15 DIAGNOSIS — M3320 Polymyositis, organ involvement unspecified: Secondary | ICD-10-CM | POA: Diagnosis not present

## 2022-01-15 DIAGNOSIS — M79642 Pain in left hand: Secondary | ICD-10-CM

## 2022-01-15 DIAGNOSIS — M5136 Other intervertebral disc degeneration, lumbar region: Secondary | ICD-10-CM

## 2022-01-15 DIAGNOSIS — Z96652 Presence of left artificial knee joint: Secondary | ICD-10-CM

## 2022-01-15 DIAGNOSIS — M79641 Pain in right hand: Secondary | ICD-10-CM

## 2022-01-15 DIAGNOSIS — M1711 Unilateral primary osteoarthritis, right knee: Secondary | ICD-10-CM

## 2022-01-15 DIAGNOSIS — N1831 Chronic kidney disease, stage 3a: Secondary | ICD-10-CM

## 2022-01-15 MED ORDER — PREDNISONE 5 MG PO TABS: ORAL_TABLET | ORAL | 0 refills | Status: DC

## 2022-01-15 MED ORDER — HYDROXYCHLOROQUINE SULFATE 200 MG PO TABS: 200.0000 mg | ORAL_TABLET | Freq: Every day | ORAL | 0 refills | Status: DC

## 2022-01-15 NOTE — Progress Notes (Signed)
Office Visit Note  Patient: Connie Brady             Date of Birth: [birth year]           MRN: [id number]             PCP: Kelton Pillar, MD Referring: Kelton Pillar, MD Visit Date: 01/15/2022 Occupation: _0 @  Subjective:  Pain in multiple joints   History of Present Illness: Connie Brady is a 51 y.o. female with history of polymyositis, Sjogren's syndrome, DDD, and osteoarthritis.  Patient remains on Imuran 50 mg 3 tablets daily and prednisone 6 mg daily.  She has been tolerating both medications and has not missed any doses recently.  Patient presents today with increased pain, swelling, and stiffness involving multiple joints.  She states earlier last week she started to have pain and swelling in the left foot.  She states her symptoms have progressively been worsening for the past 1 week.  For the past 3 to 4 days she has had difficulty walking due to the severity of pain and swelling in her feet.  She is also had inflammation in her hands and left elbow.  She is noticing some increased discomfort in both knees which she attributes to gait changes.  She denies any muscular weakness at this time.  She denies any recent injury or fall.  She denies a trigger for the flare.  She has not been taking gabapentin as consistently as usual. She continues to have chronic sicca symptoms and remains on pilocarpine as prescribed.   Activities of Daily Living:  Patient reports morning stiffness for all day. Patient Reports nocturnal pain.  Difficulty dressing/grooming: Reports Difficulty climbing stairs: Reports Difficulty getting out of chair: Reports Difficulty using hands for taps, buttons, cutlery, and/or writing: Denies  Review of Systems  Constitutional:  Positive for fatigue.  HENT:  Positive for mouth dryness. Negative for mouth sores.   Eyes:  Negative for dryness.  Respiratory:  Negative for shortness of breath.   Cardiovascular:  Negative for chest pain and  palpitations.  Gastrointestinal:  Negative for blood in stool, constipation and diarrhea.  Endocrine: Negative for increased urination.  Genitourinary:  Negative for involuntary urination.  Musculoskeletal:  Positive for joint pain, gait problem, joint pain, joint swelling, myalgias, muscle weakness, morning stiffness, muscle tenderness and myalgias.  Skin:  Positive for color change. Negative for rash, hair loss and sensitivity to sunlight.  Allergic/Immunologic: Negative for susceptible to infections.  Neurological:  Negative for dizziness and headaches.  Hematological:  Negative for swollen glands.  Psychiatric/Behavioral:  Positive for depressed mood and sleep disturbance. The patient is not nervous/anxious.     PMFS History:  Patient Active Problem List   Diagnosis Date Noted   Sjogren syndrome, unspecified ([place]) 05/22/2021   Pain of left lower leg 11/15/2020   Osteoarthritis of left knee 04/07/2019   Left lateral knee pain 08/24/2017   S/P arthroscopic surgery of left knee 08/24/2017   Abnormal cervical Papanicolaou smear 08/31/2016   Obesity 08/31/2016   Premenstrual symptom 08/31/2016   Vitamin D deficiency 08/25/2016   History of chronic kidney disease 05/07/2016   Chronic kidney disease (CKD), stage III (moderate) ([place]) 03/30/2016   High risk medication use 01/10/2016   Primary osteoarthritis of both hips 01/10/2016   Primary osteoarthritis of both knees 01/10/2016   Primary osteoarthritis of both feet 01/10/2016   Bilateral plantar fasciitis 01/10/2016   Chest pain 01/26/2013   Polymyositis ([place]) 01/26/2013   Sjoegren syndrome  Past Medical History:  Diagnosis Date   Anemia    Chronic kidney disease    stage 3   GERD (gastroesophageal reflux disease)    Polymyositis (HCC)    also neuropathy of feet   Sjoegren syndrome     Family History  Problem Relation Age of Onset   Hypertension Sister    Hypertension Brother    Diabetes Brother    Hypertension  Sister    Hypertension Sister    Hypertension Brother    Diabetes Brother    Past Surgical History:  Procedure Laterality Date   BUNIONECTOMY Bilateral    KNEE ARTHROPLASTY Left 2018   meniscal tear   KNEE ARTHROSCOPY WITH LATERAL MENISECTOMY Left 08/24/2017   Procedure: LEFT KNEE ARTHROSCOPY WITH LATERAL MENISECTOMY, CHONDROPLASTY, ARTHROSCOPIC ASSISTED INTERNAL FIXATION LATERAL TIBIAL PLATEAU;  Surgeon: Sydnee Cabal, MD;  Location: [facility];  Service: Orthopedics;  Laterality: Left;   KNEE SURGERY     right   MUSCLE BIOPSY     r/thigh   PLANTAR FASCIA SURGERY Right    TOTAL KNEE ARTHROPLASTY Left 04/07/2019   Procedure: TOTAL KNEE ARTHROPLASTY;  Surgeon: Sydnee Cabal, MD;  Location: [facility];  Service: Orthopedics;  Laterality: Left;  adductor canal   TOTAL SHOULDER ARTHROPLASTY Left 01/12/2017   Social History   Social History Narrative   Not on file   Immunization History  Administered Date(s) Administered   PFIZER(Purple Top)SARS-COV-2 Vaccination 08/17/2019, 09/07/2019, 06/30/2020     Objective: Vital Signs: BP 119/80 (BP Location: Left Arm, Patient Position: Sitting, Cuff Size: Large)   Pulse 81   Resp 17   Ht _0  (1.727 m)   Wt 219 lb 6.4 oz (99.5 kg)   BMI 33.36 kg/m    Physical Exam Vitals and nursing note reviewed.  Constitutional:      Appearance: She is well-developed.  HENT:     Head: Normocephalic and atraumatic.  Eyes:     Conjunctiva/sclera: Conjunctivae normal.  Cardiovascular:     Rate and Rhythm: Normal rate and regular rhythm.     Heart sounds: Normal heart sounds.  Pulmonary:     Effort: Pulmonary effort is normal.     Breath sounds: Normal breath sounds.  Abdominal:     General: Bowel sounds are normal.     Palpations: Abdomen is soft.  Musculoskeletal:     Cervical back: Normal range of motion.  Skin:    General: Skin is warm and dry.     Capillary Refill: Capillary refill takes less than 2 seconds.   Neurological:     Mental Status: She is alert and oriented to person, place, and time.  Psychiatric:        Behavior: Behavior normal.      Musculoskeletal Exam: C-spine, thoracic spine, lumbar spine have good range of motion.  Shoulder joints have good range of motion with some discomfort in the left shoulder.  Elbow joints have good range of motion with some tenderness along the left elbow joint line.  Tenderness and synovitis of the right second MCP joint and left third MCP and PIP joint.  Hip joints have good range of motion with no groin pain.  Left knee replacement has some warmth but no effusion.  Right knee joint has good range of motion with no warmth or effusion.  Tenderness and synovitis of both ankle joints noted.  Tenderness and synovitis over several MTP joints as described below. Bilateral bunionectomy postsurgical changes were noted.   CDAI Exam: CDAI  Score: -- Patient Global: --; Provider Global: -- Swollen: 11 ; Tender: 13  Joint Exam 01/15/2022      Right  Left  Glenohumeral      Tender  Elbow      Tender  MCP 2  Swollen Tender     MCP 3     Swollen Tender  PIP 3     Swollen Tender  Ankle  Swollen Tender  Swollen Tender  MTP 2  Swollen Tender  Swollen Tender  MTP 3  Swollen Tender  Swollen Tender  MTP 4     Swollen Tender  PIP 2 (toe)  Swollen Tender        Investigation: No additional findings.  Imaging: No results found.  Recent Labs: Lab Results  Component Value Date   WBC 5.9 12/22/2021   HGB 12.0 12/22/2021   PLT 241 12/22/2021   NA 139 12/22/2021   K 4.3 12/22/2021   CL 105 12/22/2021   CO2 28 12/22/2021   GLUCOSE 121 (H) 12/22/2021   BUN 14 12/22/2021   CREATININE 1.26 (H) 12/22/2021   BILITOT 0.5 12/22/2021   ALKPHOS 57 04/04/2019   AST 15 12/22/2021   ALT 8 12/22/2021   PROT 6.5 12/22/2021   PROT 6.6 12/22/2021   ALBUMIN 3.7 04/04/2019   CALCIUM 9.0 12/22/2021   GFRAA 50 (L) 08/02/2020    Speciality Comments: PLQ Eye Exam:  06/24/2021 WNL Follow up 1 year [place]  Fosamax to start date December 17, 2019  Procedures:  No procedures performed Allergies: Lyrica [pregabalin] and Percocet [oxycodone-acetaminophen]   Assessment / Plan:     Visit Diagnoses: Polymyositis ([place]) - History of elevated CK, positive muscle biopsy, myositis panel negative, initial CK 522: Patient is not currently experiencing any signs or symptoms of a myositis flare.  She has no muscular weakness on examination today.  Her CK was 177-stable on 12/22/2021.  She remains on Imuran 50 mg 3 tablets by mouth daily and prednisone 6 mg daily.  She is been tolerating combination therapy and has not missed any doses recently. She presents today with signs of inflammatory arthritis flare.  She is currently experiencing pain, stiffness, and synovitis involving multiple joints.  Her pain is most severe in both ankle joints and both feet.  The plan is to increase prednisone to 20 mg daily for 1 week and to taper by 5 mg weekly until she reaches her 6 mg maintenance dose.  We will also try adding on Plaquenil as combination therapy.  Indications, contraindications, potential side effects of Plaquenil were discussed again today in detail.  The patient previously took Plaquenil for 2 weeks which she tolerated but discontinued due to the concern for ocular toxicity.  She was encouraged to schedule a baseline Plaquenil eye examination.  She will notify us if she develops any side effects while taking Plaquenil.  A new prescription for Plaquenil sent to the pharmacy.  She will remain on Imuran as combination therapy.  She will follow-up in the office in 6 weeks or sooner if needed.  Patient was counseled on the purpose, proper use, and adverse effects of hydroxychloroquine including nausea/diarrhea, skin rash, headaches, and sun sensitivity.  Advised patient to wear sunscreen once starting hydroxychloroquine to reduce risk of rash associated with sun sensitivity.   Discussed importance of annual eye exams while on hydroxychloroquine to monitor to ocular toxicity and discussed importance of frequent laboratory monitoring.  Provided patient with eye exam form for baseline ophthalmologic exam.  Reviewed risk for QTC prolongation when used in combination with other QTc prolonging agents (including but not limited to antiarrhythmics, macrolide antibiotics, flouroquinolones, tricyclic antidepressants, citalopram, specific antipsychotics, ondansetron, migraine triptans, and methadone). Provided patient with educational materials on hydroxychloroquine and answered all questions.  Patient consented to hydroxychloroquine. Will upload consent in the media tab.    Dose will be Plaquenil 200 mg once daily.  Prescription pending lab results.  High risk medication use - Imuran 50 mg 3 tablets by mouth daily.  She will be adding on Plaquenil 200 mg 1 tablet by mouth daily.  Reduced dose of Plaquenil due to low GFR. Advised to schedule a baseline Plaquenil eye examination.  Sjogren's syndrome with keratoconjunctivitis sicca (HCC) - ANA+. Ro+: She continues to have chronic sicca symptoms.  She takes pilocarpine 5 mg 1 to 2 tablets daily for symptomatic relief.  She is also using Systane eyedrops as needed. Of note she presents today with symptoms of an inflammatory Tritus flare.  She is currently having tenderness and synovitis involving multiple joints.  Her pain has been most severe in both feet for the past 1 week.  She had difficulty ambulating due to the severity of pain.  On examination she has tenderness and synovitis of both ankle joints and over several MTP joints.  A prednisone taper starting at 20 mg tapering by 5 mg every week was sent to the pharmacy.  She will remain on prednisone 6 mg daily as maintenance once she has completed the taper.  She will also be adding on Plaquenil as combination therapy and will remain on Imuran as prescribed.  On prednisone therapy  -Maintenance dose: Prednisone 6 mg by mouth daily.  A prednisone taper was sent to the pharmacy as discussed above to treat her current flare.   Pain in both hands - RF negative and anti-CCP negative on 04/30/2021.  X-rays of both hands were consistent with osteoarthritic changes on 06/13/2021. She presents today with tenderness and synovitis of the right second MCP joint and left third MCP and PIP joint.  Prednisone taper was sent to the pharmacy as discussed above.  Plaquenil will be added on as combination therapy and she will remain on Imuran as prescribed.  Primary osteoarthritis of both hips: She has good ROM with no discomfort currently.   S/P total knee arthroplasty, left - Bone scan performed 08/22/2020: Focal delayed increased uptake of tracer at lateral left tibial plateau adjacent to prosthesis.   Intermittent discomfort.   No effusion noted.   Primary osteoarthritis of right knee: She has been experiencing some increased discomfort in the right knee joint which she attributes to gait changes due to increased pain and inflammation in her feet.  On examination she has good range of motion of the right knee joint with no warmth or effusion.  Primary osteoarthritis of both feet -Patient presents today with significant pain and inflammation involving both feet.  On examination she has painful range of motion and tenderness of both ankle joints with warmth and swelling.  Tenderness and synovitis over several MTP joints also noted.  She is having difficulty ambulating due to the severity of pain and inflammation in her feet at this time.  A prednisone taper was sent to the pharmacy as discussed above.    DDD (degenerative disc disease), lumbar: She experiences intermittent discomfort in her lower back.  She is no midline spinal tenderness or symptoms of radiculopathy at this time.  Osteopenia of multiple sites - DEXA updated on  11/16/19: LFN BMD 0.759 with T-score -1.3.  DEXA updated on 11/25/2021:  LFN BMD 0.792.  T score -0.5.  All scans sites are stable.  Recommend repeating DEXA in 2 years since she remains on long-term prednisone.  Patient currently on a drug holiday from Fosamax (previously treated from February 2022 to November 2023).   History of vitamin D deficiency: Taking a vitamin D supplement daily.  Stage 3a chronic kidney disease ([place]) - She is followed by Dr. Moshe Cipro.  Creatinine was 1.26 and GFR low at 51 on 12/22/21.   Elevated sed rate: ESR was 36 on 12/22/21.   Orders: No orders of the defined types were placed in this encounter.  Meds ordered this encounter  Medications   predniSONE (DELTASONE) 5 MG tablet    Sig: Take 4 tablets by mouth daily x1 wk, 3 tablets daily x1wk, 2 tablet daily x1wk, 1 tablet daily x1wk.    Dispense:  70 tablet    Refill:  0   hydroxychloroquine (PLAQUENIL) 200 MG tablet    Sig: Take 1 tablet (200 mg total) by mouth daily.    Dispense:  90 tablet    Refill:  0      Follow-Up Instructions: Return in about 6 weeks (around 02/26/2022).   Ofilia Neas, PA-C  Note - This record has been created using Dragon software.  Chart creation errors have been sought, but may not always  have been located. Such creation errors do not reflect on  the standard of medical care.

## 2022-02-05 ENCOUNTER — Other Ambulatory Visit: Payer: Self-pay | Admitting: Rheumatology

## 2022-02-05 NOTE — Telephone Encounter (Signed)
Next Visit: 03/02/2022  Last Visit: 12/23/2021  Last Fill: 11/07/2021  Dx: Sjogren's syndrome with keratoconjunctivitis sicca   Current Dose per office note on 01/15/2022: pilocarpine 5 mg 1 to 2 tablets daily for symptomatic relief.   Okay to refill Pilocarpine?

## 2022-02-19 NOTE — Progress Notes (Unsigned)
Office Visit Note  Patient: Connie Brady             Date of Birth: 08/25/1970           MRN: [id number]             PCP: Kelton Pillar, MD Referring: Kelton Pillar, MD Visit Date: 03/02/2022 Occupation: '@GUAROCC'$ @  Subjective:  Medication monitoring   History of Present Illness: Connie Brady is a 52 y.o. female with history of polymyositis and sjogren's syndrome. She is taking Imuran 50 mg 3 tablets by mouth daily and Plaquenil 200 mg 1 tablet by mouth daily.  Plaquenil was added after her last office visit on 01/15/2022.  She has been tolerating Plaquenil without any side effects.  Patient took a prednisone taper starting at 20 mg tapering by 5 mg every 4 days after her last office visit.  Her joint pain and inflammation improved significantly after completing the prednisone taper.  Overall she has noticed about a 90% improvement in her joint pain and inflammation since initiating Plaquenil.  She continues to have some pain in both feet consistent with plantar fasciitis.  She is scheduled for an upcoming appointment with Dr. Milinda Pointer this week for further evaluation.  She denies any signs or symptoms of a myositis flare.  She continues to have chronic sicca symptoms and takes pilocarpine 5 mg 1 to 2 tablets daily as needed.  She denies any recurrent infections.  Activities of Daily Living:  Patient reports joint stiffness all day  Patient Reports nocturnal pain.  Difficulty dressing/grooming: Denies Difficulty climbing stairs: Denies Difficulty getting out of chair: Reports Difficulty using hands for taps, buttons, cutlery, and/or writing: Denies  Review of Systems  Constitutional:  Positive for fatigue.  HENT:  Positive for mouth dryness. Negative for mouth sores.   Eyes:  Positive for dryness.  Respiratory:  Negative for shortness of breath.   Cardiovascular:  Negative for chest pain and palpitations.  Gastrointestinal:  Positive for diarrhea. Negative for blood in stool  and constipation.  Endocrine: Positive for increased urination.  Genitourinary:  Negative for involuntary urination.  Musculoskeletal:  Positive for joint pain, joint pain, muscle weakness, morning stiffness and muscle tenderness. Negative for gait problem, joint swelling, myalgias and myalgias.  Skin:  Negative for color change, rash, hair loss and sensitivity to sunlight.  Allergic/Immunologic: Negative for susceptible to infections.  Neurological:  Negative for dizziness and headaches.  Hematological:  Negative for swollen glands.  Psychiatric/Behavioral:  Positive for depressed mood and sleep disturbance. The patient is not nervous/anxious.     PMFS History:  Patient Active Problem List   Diagnosis Date Noted   Sjogren syndrome, unspecified ([place]) 05/22/2021   Pain of left lower leg 11/15/2020   Osteoarthritis of left knee 04/07/2019   Left lateral knee pain 08/24/2017   S/P arthroscopic surgery of left knee 08/24/2017   Abnormal cervical Papanicolaou smear 08/31/2016   Obesity 08/31/2016   Premenstrual symptom 08/31/2016   Vitamin D deficiency 08/25/2016   History of chronic kidney disease 05/07/2016   Chronic kidney disease (CKD), stage III (moderate) ([place]) 03/30/2016   High risk medication use 01/10/2016   Primary osteoarthritis of both hips 01/10/2016   Primary osteoarthritis of both knees 01/10/2016   Primary osteoarthritis of both feet 01/10/2016   Bilateral plantar fasciitis 01/10/2016   Chest pain 01/26/2013   Polymyositis ([place]) 01/26/2013   Sjoegren syndrome     Past Medical History:  Diagnosis Date   Anemia  Chronic kidney disease    stage 3   GERD (gastroesophageal reflux disease)    Polymyositis (HCC)    also neuropathy of feet   Sjoegren syndrome     Family History  Problem Relation Age of Onset   Hypertension Sister    Hypertension Sister    Hypertension Sister    Hypertension Brother    Diabetes Brother    Hypertension Brother    Diabetes  Brother    Past Surgical History:  Procedure Laterality Date   BUNIONECTOMY Bilateral    KNEE ARTHROPLASTY Left 2018   meniscal tear   KNEE ARTHROSCOPY WITH LATERAL MENISECTOMY Left 08/24/2017   Procedure: LEFT KNEE ARTHROSCOPY WITH LATERAL MENISECTOMY, CHONDROPLASTY, ARTHROSCOPIC ASSISTED INTERNAL FIXATION LATERAL TIBIAL PLATEAU;  Surgeon: Sydnee Cabal, MD;  Location: [facility];  Service: Orthopedics;  Laterality: Left;   KNEE SURGERY     right   MUSCLE BIOPSY     r/thigh   PLANTAR FASCIA SURGERY Right    TOTAL KNEE ARTHROPLASTY Left 04/07/2019   Procedure: TOTAL KNEE ARTHROPLASTY;  Surgeon: Sydnee Cabal, MD;  Location: [facility];  Service: Orthopedics;  Laterality: Left;  adductor canal   TOTAL SHOULDER ARTHROPLASTY Left 01/12/2017   Social History   Social History Narrative   Not on file   Immunization History  Administered Date(s) Administered   PFIZER(Purple Top)SARS-COV-2 Vaccination 08/17/2019, 09/07/2019, 06/30/2020     Objective: Vital Signs: BP 110/74 (BP Location: Left Arm, Patient Position: Sitting, Cuff Size: Normal)   Pulse 96   Resp 15   Ht '5\' 8"'$  (1.727 m)   Wt 215 lb (97.5 kg)   BMI 32.69 kg/m    Physical Exam Vitals and nursing note reviewed.  Constitutional:      Appearance: She is well-developed.  HENT:     Head: Normocephalic and atraumatic.  Eyes:     Conjunctiva/sclera: Conjunctivae normal.  Cardiovascular:     Rate and Rhythm: Normal rate and regular rhythm.     Heart sounds: Normal heart sounds.  Pulmonary:     Effort: Pulmonary effort is normal.     Breath sounds: Normal breath sounds.  Abdominal:     General: Bowel sounds are normal.     Palpations: Abdomen is soft.  Musculoskeletal:     Cervical back: Normal range of motion.  Skin:    General: Skin is warm and dry.     Capillary Refill: Capillary refill takes less than 2 seconds.  Neurological:     Mental Status: She is alert and oriented to person, place, and  time.  Psychiatric:        Behavior: Behavior normal.      Musculoskeletal Exam: C-spine, thoracic spine, lumbar spine have good range of motion.  No midline spinal tenderness or SI joint tenderness.  Shoulder joints, elbow joints, wrist joints, MCPs, PIPs, DIPs have good range of motion with no synovitis.  Mild fullness in the right wrist but no tenderness upon palpation.  Complete fist formation bilaterally.  Hip joints have good range of motion with no groin pain.  Left knee replacement has warmth but no effusion.  Right knee joint has good range of motion with no warmth or effusion.  Ankle joints have good range of motion with no tenderness or synovitis.  No tenderness or synovitis of MTP joints.  Bilateral bunionectomy postsurgical scars noted. Tenderness along the plantar fascia bilaterally.   CDAI Exam: CDAI Score: -- Patient Global: --; Provider Global: -- Swollen: --; Tender: -- Joint  Exam 03/02/2022   No joint exam has been documented for this visit   There is currently no information documented on the homunculus. Go to the Rheumatology activity and complete the homunculus joint exam.  Investigation: No additional findings.  Imaging: No results found.  Recent Labs: Lab Results  Component Value Date   WBC 5.9 12/22/2021   HGB 12.0 12/22/2021   PLT 241 12/22/2021   NA 139 12/22/2021   K 4.3 12/22/2021   CL 105 12/22/2021   CO2 28 12/22/2021   GLUCOSE 121 (H) 12/22/2021   BUN 14 12/22/2021   CREATININE 1.26 (H) 12/22/2021   BILITOT 0.5 12/22/2021   ALKPHOS 57 04/04/2019   AST 15 12/22/2021   ALT 8 12/22/2021   PROT 6.5 12/22/2021   PROT 6.6 12/22/2021   ALBUMIN 3.7 04/04/2019   CALCIUM 9.0 12/22/2021   GFRAA 50 (L) 08/02/2020    Speciality Comments: PLQ Eye Exam: 06/24/2021 WNL Follow up 1 year [facility]  Fosamax to start date December 17, 2019  Procedures:  No procedures performed Allergies: Lyrica [pregabalin] and Percocet [oxycodone-acetaminophen]     Assessment / Plan:     Visit Diagnoses: Polymyositis ([place]) - History of elevated CK, positive muscle biopsy, myositis panel negative, initial CK 522: She has not had any signs or symptoms of a myositis flare.  She remains on Imuran and prednisone as prescribed.  Plaquenil was added after her last office visit for better management of inflammatory arthritis.  She has noticed a 90% improvement in her joint pain and inflammation since initiating Plaquenil.  She has no synovitis on examination today.  No muscular weakness noted.   CK was 177 on 12/22/2021-stable.  CK will be rechecked today.  She will remain on Imuran and plaquenil as prescribed.  She was advised to notify us if she develops signs or symptoms of a flare.  She will follow up in 2 months or sooner if needed. - Plan: CK, Sedimentation rate  High risk medication use - Imuran 50 mg 3 tablets by mouth daily and Plaquenil '200mg'$  1 tablet by mouth daily.  Started plaquneil after her last visit on 01/15/22.  PLQ Eye Exam: 06/24/2021 WNL Follow up 1 year [facility].  CBC and CMP orders released.   - Plan: COMPLETE METABOLIC PANEL WITH GFR, CBC with Differential/Platelet  Sjogren's syndrome with keratoconjunctivitis sicca (HCC) - ANA+. Ro+: Patient continues to have chronic sicca symptoms.  Overall her symptoms have been manageable taking pilocarpine 5 mg 1 to 2 tablets daily for symptomatic relief. She remains on Imuran 50 mg 3 tablets by mouth daily and Plaquenil 200 mg 1 tablet by mouth daily.  She is also taking prednisone 6 mg daily and has been unable to taper. Plaquenil was added after her last office visit on 01/15/2022 for better management of inflammatory arthritis.  She had no synovitis on examination today.  She has noticed about a 90% improvement in her symptoms on combination therapy.  No medication changes will be made at this time.  She was advised to notify us if she develops signs or symptoms of a flare.  She will follow-up in the  office in 2 months or sooner if needed. - Plan: Sedimentation rate  Elevated sed rate: ESR was elevated-36 on 12/22/21.  ESR will be rechecked today.   On prednisone therapy - Maintenance dose: Prednisone 6 mg by mouth daily. Unable to taper.  Patient is aware of the long term risks of prednisone use.  Pain in both hands - RF negative and anti-CCP negative on 04/30/2021.  X-rays of both hands were consistent with osteoarthritic changes on 06/13/2021.  No synovitis noted on examination today.  She has noticed a 90% improvement in her joint pain and inflammation since adding on Plaquenil after her last office visit on 01/15/22.   Primary osteoarthritis of both hips: Good ROM of both hip joints with no groin pain.   S/P total knee arthroplasty, left - Bone scan performed 08/22/2020: Focal delayed increased uptake of tracer at lateral left tibial plateau adjacent to prosthesis. Warmth but no effusion.  She experiences intermittent discomfort and joint stiffness especially with inactivity.   Primary osteoarthritis of right knee: Good ROM of the right knee joint with no warmth or effusion.   Primary osteoarthritis of both feet: Upcoming appointment scheduled with Dr. Milinda Pointer.   DDD (degenerative disc disease), lumbar: No midline spinal tenderness at this time.   Osteopenia of multiple sites - DEXA updated on 11/25/2021: LFN BMD 0.792.  T score -0.5.  All scans sites are stable. Previous DEXA on 11/16/19: LFN BMD 0.759 with T-score -1.3.   Patient currently on a drug holiday from Fosamax (previously treated from February 2022 to November 2023).   She remains on a vitamin D supplement daily.    History of vitamin D deficiency: She is taking a daily vitamin D supplement.   Stage 3a chronic kidney disease ([place]) - She is followed by Dr. Moshe Cipro.    Orders: Orders Placed This Encounter  Procedures   COMPLETE METABOLIC PANEL WITH GFR   CBC with Differential/Platelet   CK   Sedimentation rate    Meds ordered this encounter  Medications   predniSONE (DELTASONE) 5 MG tablet    Sig: Take 1 tablet (5 mg total) by mouth daily with breakfast.    Dispense:  30 tablet    Refill:  2   predniSONE (DELTASONE) 1 MG tablet    Sig: TAKE 1 TABLET EVERY DAY WITH '5MG'$  TABLET    Dispense:  90 tablet    Refill:  0      Follow-Up Instructions: Return in about 2 months (around 05/01/2022) for Polymyositis, Sjogren's syndrome.   Ofilia Neas, PA-C  Note - This record has been created using Dragon software.  Chart creation errors have been sought, but may not always  have been located. Such creation errors do not reflect on  the standard of medical care.

## 2022-03-02 ENCOUNTER — Encounter: Payer: Self-pay | Admitting: Physician Assistant

## 2022-03-02 ENCOUNTER — Ambulatory Visit: Payer: BC Managed Care – PPO | Attending: Physician Assistant | Admitting: Physician Assistant

## 2022-03-02 VITALS — BP 110/74 | HR 96 | Resp 15 | Ht 68.0 in | Wt 215.0 lb

## 2022-03-02 DIAGNOSIS — M3501 Sicca syndrome with keratoconjunctivitis: Secondary | ICD-10-CM | POA: Diagnosis not present

## 2022-03-02 DIAGNOSIS — R700 Elevated erythrocyte sedimentation rate: Secondary | ICD-10-CM

## 2022-03-02 DIAGNOSIS — M8589 Other specified disorders of bone density and structure, multiple sites: Secondary | ICD-10-CM

## 2022-03-02 DIAGNOSIS — Z7952 Long term (current) use of systemic steroids: Secondary | ICD-10-CM | POA: Diagnosis not present

## 2022-03-02 DIAGNOSIS — Z96652 Presence of left artificial knee joint: Secondary | ICD-10-CM

## 2022-03-02 DIAGNOSIS — Z8639 Personal history of other endocrine, nutritional and metabolic disease: Secondary | ICD-10-CM

## 2022-03-02 DIAGNOSIS — M79641 Pain in right hand: Secondary | ICD-10-CM

## 2022-03-02 DIAGNOSIS — Z79899 Other long term (current) drug therapy: Secondary | ICD-10-CM | POA: Diagnosis not present

## 2022-03-02 DIAGNOSIS — M3320 Polymyositis, organ involvement unspecified: Secondary | ICD-10-CM | POA: Diagnosis not present

## 2022-03-02 DIAGNOSIS — M160 Bilateral primary osteoarthritis of hip: Secondary | ICD-10-CM

## 2022-03-02 DIAGNOSIS — N1831 Chronic kidney disease, stage 3a: Secondary | ICD-10-CM

## 2022-03-02 DIAGNOSIS — M79642 Pain in left hand: Secondary | ICD-10-CM

## 2022-03-02 DIAGNOSIS — M5136 Other intervertebral disc degeneration, lumbar region: Secondary | ICD-10-CM

## 2022-03-02 DIAGNOSIS — M19072 Primary osteoarthritis, left ankle and foot: Secondary | ICD-10-CM

## 2022-03-02 DIAGNOSIS — M1711 Unilateral primary osteoarthritis, right knee: Secondary | ICD-10-CM

## 2022-03-02 DIAGNOSIS — M19071 Primary osteoarthritis, right ankle and foot: Secondary | ICD-10-CM

## 2022-03-02 MED ORDER — PREDNISONE 1 MG PO TABS: ORAL_TABLET | ORAL | 0 refills | Status: DC

## 2022-03-02 MED ORDER — PREDNISONE 5 MG PO TABS: 5.0000 mg | ORAL_TABLET | Freq: Every day | ORAL | 2 refills | Status: DC

## 2022-03-03 LAB — COMPLETE METABOLIC PANEL WITH GFR
AG Ratio: 1.2 (calc) (ref 1.0–2.5)
ALT: 10 U/L (ref 6–29)
AST: 16 U/L (ref 10–35)
Albumin: 3.8 g/dL (ref 3.6–5.1)
Alkaline phosphatase (APISO): 61 U/L (ref 37–153)
BUN/Creatinine Ratio: 7 (calc) (ref 6–22)
BUN: 9 mg/dL (ref 7–25)
CO2: 28 mmol/L (ref 20–32)
Calcium: 9 mg/dL (ref 8.6–10.4)
Chloride: 107 mmol/L (ref 98–110)
Creat: 1.27 mg/dL — ABNORMAL HIGH (ref 0.50–1.03)
Globulin: 3.1 g/dL (calc) (ref 1.9–3.7)
Glucose, Bld: 104 mg/dL — ABNORMAL HIGH (ref 65–99)
Potassium: 4.4 mmol/L (ref 3.5–5.3)
Sodium: 142 mmol/L (ref 135–146)
Total Bilirubin: 0.3 mg/dL (ref 0.2–1.2)
Total Protein: 6.9 g/dL (ref 6.1–8.1)
eGFR: 51 mL/min/{1.73_m2} — ABNORMAL LOW (ref >=60)

## 2022-03-03 LAB — CBC WITH DIFFERENTIAL/PLATELET
Absolute Monocytes: 333 cells/uL (ref 200–950)
Basophils Absolute: 29 cells/uL (ref 0–200)
Basophils Relative: 0.6 %
Eosinophils Absolute: 10 cells/uL — ABNORMAL LOW (ref 15–500)
Eosinophils Relative: 0.2 %
HCT: 39.1 % (ref 35.0–45.0)
Hemoglobin: 12.9 g/dL (ref 11.7–15.5)
Lymphs Abs: 1323 cells/uL (ref 850–3900)
MCH: 28.5 pg (ref 27.0–33.0)
MCHC: 33 g/dL (ref 32.0–36.0)
MCV: 86.5 fL (ref 80.0–100.0)
MPV: 10.3 fL (ref 7.5–12.5)
Monocytes Relative: 6.8 %
Neutro Abs: 3205 cells/uL (ref 1500–7800)
Neutrophils Relative %: 65.4 %
Platelets: 278 10*3/uL (ref 140–400)
RBC: 4.52 10*6/uL (ref 3.80–5.10)
RDW: 13.2 % (ref 11.0–15.0)
Total Lymphocyte: 27 %
WBC: 4.9 10*3/uL (ref 3.8–10.8)

## 2022-03-03 LAB — CK: Total CK: 161 U/L — ABNORMAL HIGH (ref 29–143)

## 2022-03-03 LAB — SEDIMENTATION RATE: Sed Rate: 45 mm/h — ABNORMAL HIGH (ref 0–30)

## 2022-03-03 NOTE — Progress Notes (Signed)
Absolute eosinophils are low.  Rest of CBC WNL.  Creatinine remains elevated-stable and GFR is low-51-unchanged.  Avoid the use of NSAIDs. Forward lab work to nephrologist.   CK is borderline elevated but has improved-161.  ESR remains elevated-45.  Please advise patient to remain on imuran and plaquenil as prescribed.  Please advise the patient to notify us if she develops signs or symptoms of an inflammatory arthritis flare.

## 2022-03-05 ENCOUNTER — Encounter: Payer: Self-pay | Admitting: Podiatry

## 2022-03-05 ENCOUNTER — Ambulatory Visit: Payer: BC Managed Care – PPO | Admitting: Podiatry

## 2022-03-05 DIAGNOSIS — M722 Plantar fascial fibromatosis: Secondary | ICD-10-CM

## 2022-03-05 MED ORDER — TRIAMCINOLONE ACETONIDE 40 MG/ML IJ SUSP
20.0000 mg | Freq: Once | INTRAMUSCULAR | Status: AC
  Administered 2022-03-05: 20 mg

## 2022-03-05 NOTE — Progress Notes (Signed)
She presents today for follow-up of her left foot pain she has a history of polymyositis.  She states that the left foot is worse than the right.  States that she just finished a round of Dosepak then she stays on prednisone on a daily basis.  Objective: Vital signs are stable she alert oriented x 3 there is no erythema edema cellulitis drainage or odor she does have thickness and hardness on pain on palpation of the plantar aspect of the left heel medially the right heel feels supple and is nonpainful.  Assessment: Plan fasciitis left.  Plan: Reinjected left heel today 20 mg Kenalog 5 mg Marcaine point maximal tenderness.  She is going need a note for her work we also go to get her a new set of orthotics built.  The orthotics are very flexible and have a subfive cut out on the left side

## 2022-03-16 ENCOUNTER — Ambulatory Visit (INDEPENDENT_AMBULATORY_CARE_PROVIDER_SITE_OTHER): Payer: BC Managed Care – PPO

## 2022-03-16 DIAGNOSIS — M722 Plantar fascial fibromatosis: Secondary | ICD-10-CM

## 2022-03-16 NOTE — Progress Notes (Signed)
Patient presents today to be casted for custom molded orthotics. HYATT is the treating physician.  Impression foam cast was taken. ABN signed.  Patient info-  Shoe size: 9 MEN  Shoe style: ATHLETIC  Height: 5 FT 8 IN  Weight: 211 LBS  Insurance: [employer]   Patient will be notified once orthotics arrive in office and reappoint for fitting at that time.

## 2022-03-25 ENCOUNTER — Ambulatory Visit: Payer: BC Managed Care – PPO | Admitting: Physician Assistant

## 2022-04-01 ENCOUNTER — Ambulatory Visit
Admission: RE | Admit: 2022-04-01 | Discharge: 2022-04-01 | Disposition: A | Discharge status: Home / Self Care | Payer: BC Managed Care – PPO | Source: Ambulatory Visit | Attending: Physician Assistant | Admitting: Physician Assistant

## 2022-04-01 ENCOUNTER — Other Ambulatory Visit: Payer: Self-pay | Admitting: Physician Assistant

## 2022-04-01 DIAGNOSIS — R059 Cough, unspecified: Secondary | ICD-10-CM

## 2022-04-15 ENCOUNTER — Other Ambulatory Visit (HOSPITAL_COMMUNITY): Payer: Self-pay

## 2022-04-15 MED ORDER — LISDEXAMFETAMINE DIMESYLATE 60 MG PO CHEW
60.0000 mg | CHEWABLE_TABLET | Freq: Every morning | ORAL | 0 refills | Status: DC
  Filled 2022-04-15: qty 30, 30d supply, fill #0

## 2022-04-20 ENCOUNTER — Other Ambulatory Visit: Payer: Self-pay | Admitting: Physician Assistant

## 2022-04-20 NOTE — Telephone Encounter (Signed)
Next Visit: 05/04/2022  Last Visit: 03/02/2022  Last Fill: 01/12/2022   DX: Polymyositis   Current Dose per office note 03/02/2022: Imuran 50 mg 3 tablets by mouth daily   Labs: 03/02/2022 Absolute eosinophils are low.  Rest of CBC WNL. Creatinine remains elevated-stable and GFR is low-51-unchanged.CK is borderline elevated but has improved-161.ESR remains elevated-45.  Please advise patient to remain on imuran and plaquenil as prescribed.  Okay to refill Imuran?

## 2022-04-20 NOTE — Progress Notes (Signed)
Office Visit Note  Patient: Connie Brady             Date of Birth: [birth year]           MRN: [id number]             PCP: Kelton Pillar, MD Referring: Kelton Pillar, MD Visit Date: 05/04/2022 Occupation: @GUAROCC @  Subjective:  Medication monitoring   History of Present Illness: Connie Brady is a 52 y.o. female with history of polymyositis and sjogren's syndrome.  Patient is currently taking Imuran 50 mg 3 tablets by mouth daily and Plaquenil 200mg  1 tablet by mouth daily. Started plaquneil started in December 2023.  She is tolerating combination therapy without any side effects.  She has not missed any doses recently.  She denies any signs or symptoms of a polymyositis flare.  She is not experiencing any increased muscle weakness at this time.  She remains on prednisone 6 mg daily.  She has been exercising on a regular basis.  She states that her left knee replacement is doing well.  She denies any increased joint pain or joint swelling recently.  She has noticed an improvement since adding on Plaquenil.  She continues to have symptoms of chronic dry eyes and occasional mouth dryness.  She takes pilocarpine 5 mg 1 to 2 tablets daily for symptomatic relief. She denies any recurrent infections.      Activities of Daily Living:  Patient reports morning stiffness for 0 minute.   Patient Denies nocturnal pain.  Difficulty dressing/grooming: Denies Difficulty climbing stairs: Denies Difficulty getting out of chair: Reports Difficulty using hands for taps, buttons, cutlery, and/or writing: Denies  Review of Systems  Constitutional:  Positive for fatigue.  HENT:  Positive for mouth dryness. Negative for mouth sores.   Eyes:  Positive for dryness.  Respiratory:  Negative for shortness of breath.   Cardiovascular:  Positive for chest pain and palpitations.  Gastrointestinal:  Negative for blood in stool, constipation and diarrhea.  Endocrine: Positive for increased urination.   Genitourinary:  Positive for involuntary urination.  Musculoskeletal:  Negative for joint pain, gait problem, joint pain, joint swelling, myalgias, muscle weakness, morning stiffness, muscle tenderness and myalgias.  Skin:  Negative for color change, rash, hair loss and sensitivity to sunlight.  Allergic/Immunologic: Negative for susceptible to infections.  Neurological:  Negative for dizziness and headaches.  Hematological:  Negative for swollen glands.  Psychiatric/Behavioral:  Positive for depressed mood and sleep disturbance. The patient is not nervous/anxious.     PMFS History:  Patient Active Problem List   Diagnosis Date Noted   Sjogren syndrome, unspecified ([place]) 05/22/2021   Pain of left lower leg 11/15/2020   Osteoarthritis of left knee 04/07/2019   Left lateral knee pain 08/24/2017   S/P arthroscopic surgery of left knee 08/24/2017   Abnormal cervical Papanicolaou smear 08/31/2016   Obesity 08/31/2016   Premenstrual symptom 08/31/2016   Vitamin D deficiency 08/25/2016   History of chronic kidney disease 05/07/2016   Chronic kidney disease (CKD), stage III (moderate) (HCC) 03/30/2016   High risk medication use 01/10/2016   Primary osteoarthritis of both hips 01/10/2016   Primary osteoarthritis of both knees 01/10/2016   Primary osteoarthritis of both feet 01/10/2016   Bilateral plantar fasciitis 01/10/2016   Chest pain 01/26/2013   Polymyositis ([place]) 01/26/2013   Sjoegren syndrome     Past Medical History:  Diagnosis Date   Anemia    Chronic kidney disease    stage 3  GERD (gastroesophageal reflux disease)    Polymyositis (HCC)    also neuropathy of feet   Sjoegren syndrome     Family History  Problem Relation Age of Onset   Hypertension Sister    Hypertension Sister    Hypertension Sister    Hypertension Brother    Diabetes Brother    Hypertension Brother    Diabetes Brother    Past Surgical History:  Procedure Laterality Date   BUNIONECTOMY  Bilateral    KNEE ARTHROPLASTY Left 2018   meniscal tear   KNEE ARTHROSCOPY WITH LATERAL MENISECTOMY Left 08/24/2017   Procedure: LEFT KNEE ARTHROSCOPY WITH LATERAL MENISECTOMY, CHONDROPLASTY, ARTHROSCOPIC ASSISTED INTERNAL FIXATION LATERAL TIBIAL PLATEAU;  Surgeon: Sydnee Cabal, MD;  Location: [facility];  Service: Orthopedics;  Laterality: Left;   KNEE SURGERY     right   MUSCLE BIOPSY     r/thigh   PLANTAR FASCIA SURGERY Right    TOTAL KNEE ARTHROPLASTY Left 04/07/2019   Procedure: TOTAL KNEE ARTHROPLASTY;  Surgeon: Sydnee Cabal, MD;  Location: [facility];  Service: Orthopedics;  Laterality: Left;  adductor canal   TOTAL SHOULDER ARTHROPLASTY Left 01/12/2017   Social History   Social History Narrative   Not on file   Immunization History  Administered Date(s) Administered   PFIZER(Purple Top)SARS-COV-2 Vaccination 08/17/2019, 09/07/2019, 06/30/2020     Objective: Vital Signs: BP 119/78 (BP Location: Left Arm, Patient Position: Sitting, Cuff Size: Normal)   Pulse 83   Resp 14   Ht 5\' 8"  (1.727 m)   Wt 221 lb (100.2 kg)   LMP  (LMP Unknown)   BMI 33.60 kg/m    Physical Exam Vitals and nursing note reviewed.  Constitutional:      Appearance: She is well-developed.  HENT:     Head: Normocephalic and atraumatic.  Eyes:     Conjunctiva/sclera: Conjunctivae normal.  Cardiovascular:     Rate and Rhythm: Normal rate and regular rhythm.     Heart sounds: Normal heart sounds.  Pulmonary:     Effort: Pulmonary effort is normal.     Breath sounds: Normal breath sounds.  Abdominal:     General: Bowel sounds are normal.     Palpations: Abdomen is soft.  Musculoskeletal:     Cervical back: Normal range of motion.  Lymphadenopathy:     Cervical: No cervical adenopathy.  Skin:    General: Skin is warm and dry.     Capillary Refill: Capillary refill takes less than 2 seconds.  Neurological:     Mental Status: She is alert and oriented to person, place, and  time.  Psychiatric:        Behavior: Behavior normal.      Musculoskeletal Exam: C-spine, thoracic spine, lumbar spine have good range of motion.  Some midline spinal tenderness in the C-spine.  Shoulder joints, elbow joints, wrist joints, MCPs, PIPs, DIPs have good range of motion with no synovitis.  Complete fist formation bilaterally.  Hip joints have good range of motion with no groin pain.  Left knee replacement has slightly limited extension but no warmth or effusion.  Right knee joint has good range of motion with no warmth or effusion.  Ankle joints have good range of motion with no tenderness or joint swelling.  Mild PIP and DIP thickening consistent with osteoarthritis of both feet.  No tenderness or synovitis of MTP joints.  Full strength of upper and lower extremities noted.  CDAI Exam: CDAI Score: -- Patient Global: --; Provider Global: --  Swollen: --; Tender: -- Joint Exam 05/04/2022   No joint exam has been documented for this visit   There is currently no information documented on the homunculus. Go to the Rheumatology activity and complete the homunculus joint exam.  Investigation: No additional findings.  Imaging: No results found.  Recent Labs: Lab Results  Component Value Date   WBC 4.9 03/02/2022   HGB 12.9 03/02/2022   PLT 278 03/02/2022   NA 142 03/02/2022   K 4.4 03/02/2022   CL 107 03/02/2022   CO2 28 03/02/2022   GLUCOSE 104 (H) 03/02/2022   BUN 9 03/02/2022   CREATININE 1.27 (H) 03/02/2022   BILITOT 0.3 03/02/2022   ALKPHOS 57 04/04/2019   AST 16 03/02/2022   ALT 10 03/02/2022   PROT 6.9 03/02/2022   ALBUMIN 3.7 04/04/2019   CALCIUM 9.0 03/02/2022   GFRAA 50 (L) 08/02/2020    Speciality Comments: PLQ Eye Exam: 06/24/2021 WNL Follow up 1 year [facility]  Fosamax to start date December 17, 2019  Procedures:  No procedures performed Allergies: Lyrica [pregabalin] and Percocet [oxycodone-acetaminophen]     Assessment / Plan:     Visit  Diagnoses: Polymyositis ([place]) - History of elevated CK, positive muscle biopsy, myositis panel negative, initial CK 522: She has not experienced any signs or symptoms of a myositis flare.  She has full strength of upper and lower extremities on examination today.  She remains active exercising on a regular basis.  She is currently taking Imuran 50 mg 3 tablets daily and Plaquenil 200 mg 1 tablet by mouth daily.  She remains on prednisone 6 mg daily and has been unable to taper.  She started on Plaquenil in December 2023.  She has been tolerating combination therapy without any side effects and has not missed any doses recently.  She has noticed clinical improvement on combination therapy.  She has no synovitis on examination today. CK was 161 on 03/02/2022.  Plan to update CK, sed rate, CBC, and CMP in April 2024.  Future orders were placed today.  She will remain on the current treatment regimen.  She was advised to notify us if she develops signs or symptoms of a flare.  She will follow-up in the office in 3 months or sooner if needed. - Plan: CK  High risk medication use - Imuran 50 mg 3 tablets by mouth daily and Plaquenil 200mg  1 tablet by mouth daily. Started plaquenil in December 2023.  CBC and CMP updated on 03/02/22.  No recent or recurrent infections.  Discussed the importance of holding imuran if she develops signs or symptoms of an infection and to resume once the infection has completely cleared.   PLQ Eye Exam: 06/24/2021 WNL Follow up 1 year [facility].  Advised to call to schedule an updated Plaquenil eye examination.  - Plan: CBC with Differential/Platelet, COMPLETE METABOLIC PANEL WITH GFR  Sjogren's syndrome with keratoconjunctivitis sicca (HCC) - ANA+. Ro+: Patient continues to have chronic sicca symptoms: She uses Systane eyedrops for symptoms of dry eyes.  She takes pilocarpine 5 mg 1 to 2 tablets daily as needed for symptoms of dry mouth.  Discussed the use of over-the-counter products  as well as the use of a humidifier and eye mask at night. No signs of an inflammatory arthritis flare at this time.  She had no synovitis on examination today.  She will remain on Imuran and Plaquenil as prescribed.  Plan to update sed rate with her next lab  work in April 2024.  A future order for sed rate was placed today.- Plan: Sedimentation rate  On prednisone therapy - Maintenance dose: Prednisone 6 mg by mouth daily. Unable to taper.  Patient is aware of the long term risks of prednisone use.  Pain in both hands - RF negative and anti-CCP negative on 04/30/2021.  X-rays of both hands were consistent with osteoarthritic changes on 06/13/2021.  No tenderness or synovitis noted today.  Complete fist formation bilaterally.  Discussed the importance of joint protection and muscle strengthening.  Primary osteoarthritis of both hips: She has good range of motion of both hip joints on examination today.  No groin pain currently.  S/P total knee arthroplasty, left - Bone scan performed 08/22/2020: Focal delayed increased uptake of tracer at lateral left tibial plateau adjacent to prosthesis.  Slightly limited extension.  No warmth or effusion noted today.  Primary osteoarthritis of right knee: She has good range of motion of the right knee joint on examination today.  No warmth or effusion noted.  Primary osteoarthritis of both feet: She has some PIP and DIP thickening consistent with osteoarthritis of both feet.  No tenderness or synovitis over MTP joints.  Good range of motion of both ankle joints with no tenderness or synovitis.  No evidence of Achilles tendinitis or plantar fasciitis.  DDD (degenerative disc disease), lumbar: She is not currently experiencing any increased discomfort in her lower back.  She had no midline spinal tenderness in the lumbar region.  No symptoms of radiculopathy.  Osteopenia of multiple sites - DEXA updated on 11/25/2021: LFN BMD 0.792.  T score -0.5.  All scans sites are  stable.on a drug holiday from Fosamax (previously treated Feb 2022 to Nov 2023).  She remains on a vitamin D supplement daily.  Stage 3a chronic kidney disease ([place]): Stable.  Her next lab work will be due in mid April 2024.  Standing order for CMP with GFR placed today.  Elevated sed rate: Future order for sed rate placed today.  History of vitamin D deficiency: She is taking a daily vitamin D supplement.  Other fatigue: Stable.  She has been exercising on a regular basis.  Raynaud's phenomenon without gangrene: Not currently active.  No digital ulcerations or signs of gangrene noted.  Orders: Orders Placed This Encounter  Procedures   CBC with Differential/Platelet   COMPLETE METABOLIC PANEL WITH GFR   CK   Sedimentation rate   No orders of the defined types were placed in this encounter.    Follow-Up Instructions: Return in about 3 months (around 08/04/2022) for Polymyositis, Sjogren's syndrome.   Ofilia Neas, PA-C  Note - This record has been created using Dragon software.  Chart creation errors have been sought, but may not always  have been located. Such creation errors do not reflect on  the standard of medical care.

## 2022-04-23 ENCOUNTER — Ambulatory Visit: Payer: BC Managed Care – PPO | Admitting: Pulmonary Disease

## 2022-04-23 ENCOUNTER — Encounter: Payer: Self-pay | Admitting: Pulmonary Disease

## 2022-04-23 VITALS — BP 126/70 | HR 76 | Ht 69.0 in | Wt 221.6 lb

## 2022-04-23 DIAGNOSIS — R053 Chronic cough: Secondary | ICD-10-CM | POA: Diagnosis not present

## 2022-04-23 MED ORDER — BUDESONIDE-FORMOTEROL FUMARATE 160-4.5 MCG/ACT IN AERO: 2.0000 | INHALATION_SPRAY | Freq: Two times a day (BID) | RESPIRATORY_TRACT | 11 refills | Status: DC

## 2022-04-23 NOTE — Patient Instructions (Signed)
Nice to see you  Let's try an inhaler called Symbicort - use 2 puffs twice a day every day - please rinse mouth after every use  Return to clinic in 3 months or sooner as needed

## 2022-04-23 NOTE — Progress Notes (Signed)
$'@Patient'c$  ID: Connie Brady, female    DOB: 26-Jan-1971, 52 y.o.   MRN: [id number]  Chief Complaint  Patient presents with   Consult    Pt is here for consult for chronic cough and she states that she has had the cough since Sep 2023. Pt states that the cough is productive at times. Chest xray noted on 04/01/22.     Referring provider: Lennie Odor, PA  HPI:   52 y.o. woman whom we are seeing for evaluation of chronic cough.  Most recent rheumatology note x 4 reviewed.  Most recent PCP note reviewed.  Patient contracted COVID September, late September 2023.  Since then has had daily cough.  Intermittently productive.  Sometimes dry.  No position make things better or worse.  No seasonal environmental factors she can identify to make things better or worse.  No real time and things are better or worse.  Has intermittent increased doses and prednisone for her rheumatology disease.  She notes no improvement in cough with increasing the dosing.  She is on chronic 6 mg prednisone daily.  Recently started PPI about 2 to 3 weeks ago.  This has helped the frequency of cough.  The cough still remains.  Review chest x-ray 03/2022 that on my review interpretation reveals clear lungs bilaterally, slightly elevated left hemidiaphragm.  PMH: Sjogren's, polymyositis Family history: Sister with hypertension, father with diabetes and hypertension Surgical history: Knee surgery bilaterally, shoulder surgery Social history: Never smoker, lives in [place], [place] Past Surgical History:  Procedure Laterality Date   BUNIONECTOMY Bilateral    KNEE ARTHROPLASTY Left 2018   meniscal tear   KNEE ARTHROSCOPY WITH LATERAL MENISECTOMY Left 08/24/2017   Procedure: LEFT KNEE ARTHROSCOPY WITH LATERAL MENISECTOMY, CHONDROPLASTY, ARTHROSCOPIC ASSISTED INTERNAL FIXATION LATERAL TIBIAL PLATEAU;  Surgeon: Sydnee Cabal, MD;  Location: [facility];  Service: Orthopedics;  Laterality: Left;   KNEE  SURGERY     right   MUSCLE BIOPSY     r/thigh   PLANTAR FASCIA SURGERY Right    TOTAL KNEE ARTHROPLASTY Left 04/07/2019   Procedure: TOTAL KNEE ARTHROPLASTY;  Surgeon: Sydnee Cabal, MD;  Location: [facility];  Service: Orthopedics;  Laterality: Left;  adductor canal   TOTAL SHOULDER ARTHROPLASTY Left 01/12/2017    Family History  Problem Relation Age of Onset   Hypertension Sister    Hypertension Sister    Hypertension Sister    Hypertension Brother    Diabetes Brother    Hypertension Brother    Diabetes Scientific laboratory technician / Pulmonary Flowsheets:   ACT:      No data to display          MMRC:     No data to display          [place]:      No data to display          Tests:   FENO:  No results found for: "NITRICOXIDE"  PFT:     No data to display          WALK:      No data to display          Imaging: Personally reviewed and as per EMR discussion DG Chest 2 View  Result Date: 04/04/2022 CLINICAL DATA:  Cough for several months.  Right-sided chest pain EXAM: CHEST - 2 VIEW COMPARISON:  X-ray 10/25/2017 FINDINGS: No consolidation, pneumothorax or effusion. Normal cardiopericardial silhouette without edema. Curvature of the spine. Air-fluid  level along the stomach beneath the left hemidiaphragm. IMPRESSION: No acute cardiopulmonary disease Electronically Signed   By: Jill Side M.D.   On: 04/04/2022 12:39    Lab Results: Personally reviewed and as per EMR CBC    Component Value Date/Time   WBC 4.9 03/02/2022 0906   RBC 4.52 03/02/2022 0906   HGB 12.9 03/02/2022 0906   HCT 39.1 03/02/2022 0906   PLT 278 03/02/2022 0906   MCV 86.5 03/02/2022 0906   MCH 28.5 03/02/2022 0906   MCHC 33.0 03/02/2022 0906   RDW 13.2 03/02/2022 0906   LYMPHSABS 1,323 03/02/2022 0906   MONOABS 330 08/20/2016 1406   EOSABS 10 (L) 03/02/2022 0906   BASOSABS 29 03/02/2022 0906    BMET    Component Value Date/Time   NA 142 03/02/2022 0906   NA  138 08/22/2018 0958   K 4.4 03/02/2022 0906   CL 107 03/02/2022 0906   CO2 28 03/02/2022 0906   GLUCOSE 104 (H) 03/02/2022 0906   BUN 9 03/02/2022 0906   BUN 12 08/22/2018 0958   CREATININE 1.27 (H) 03/02/2022 0906   CALCIUM 9.0 03/02/2022 0906   GFRNONAA 43 (L) 08/02/2020 1050   GFRAA 50 (L) 08/02/2020 1050    BNP No results found for: "BNP"  ProBNP No results found for: "PROBNP"  Specialty Problems   None   Allergies  Allergen Reactions   Lyrica [Pregabalin] Itching   Percocet [Oxycodone-Acetaminophen] Rash and Other (See Comments)    Hallucinations     Immunization History  Administered Date(s) Administered   PFIZER(Purple Top)SARS-COV-2 Vaccination 08/17/2019, 09/07/2019, 06/30/2020    Past Medical History:  Diagnosis Date   Anemia    Chronic kidney disease    stage 3   GERD (gastroesophageal reflux disease)    Polymyositis (HCC)    also neuropathy of feet   Sjoegren syndrome     Tobacco History: Social History   Tobacco Use  Smoking Status Never   Passive exposure: Current  Smokeless Tobacco Never   Counseling given: Not Answered   Continue to not smoke  Outpatient Encounter Medications as of 04/23/2022  Medication Sig   amoxicillin (AMOXIL) 500 MG capsule Prior to dental procedures.   azaTHIOprine (IMURAN) 50 MG tablet TAKE 3 TABLETS(150 MG) BY MOUTH DAILY   budesonide-formoterol (SYMBICORT) 160-4.5 MCG/ACT inhaler Inhale 2 puffs into the lungs in the morning and at bedtime.   cariprazine (VRAYLAR) 1.5 MG capsule Take 3 mg by mouth at bedtime.   Cyanocobalamin (VITAMIN B 12 PO) Take by mouth.   dexlansoprazole (DEXILANT) 60 MG capsule Take 60 mg by mouth daily.   DULoxetine (CYMBALTA) 60 MG capsule Take 60 mg by mouth daily.    estradiol-norethindrone (ACTIVELLA) 1-0.5 MG tablet Take 1 tablet by mouth daily in the afternoon. 5 pm   gabapentin (NEURONTIN) 300 MG capsule Take 1 capsule (300 mg total) by mouth 2 (two) times daily.    hydroxychloroquine (PLAQUENIL) 200 MG tablet Take 1 tablet (200 mg total) by mouth daily.   lidocaine (XYLOCAINE) 5 % ointment APPLY TO THE AFFECTED AREA(S) DAILY AS NEEDED   Lisdexamfetamine Dimesylate 60 MG CHEW Chew 1 tablet (60 mg total) by mouth in the morning.   methocarbamol (ROBAXIN) 500 MG tablet Take 1 tablet (500 mg total) by mouth 2 (two) times daily as needed for muscle spasms.   pilocarpine (SALAGEN) 5 MG tablet TAKE 1 TO 2 TABLETS BY MOUTH DAILY AS NEEDED   Polyethyl Glycol-Propyl Glycol (SYSTANE OP) Place 1 drop  into both eyes 2 (two) times daily as needed (dry eyes).   predniSONE (DELTASONE) 1 MG tablet TAKE 1 TABLET EVERY DAY WITH '5MG'$  TABLET   predniSONE (DELTASONE) 5 MG tablet TAKE 1 TABLET(5 MG) BY MOUTH DAILY WITH BREAKFAST   predniSONE (DELTASONE) 5 MG tablet Take 4 tablets by mouth daily x1 wk, 3 tablets daily x1wk, 2 tablet daily x1wk, 1 tablet daily x1wk.   predniSONE (DELTASONE) 5 MG tablet Take 1 tablet (5 mg total) by mouth daily with breakfast.   traZODone (DESYREL) 150 MG tablet Take 150 mg by mouth at bedtime.   VITAMIN D PO Take by mouth daily.   No facility-administered encounter medications on file as of 04/23/2022.     Review of Systems  Review of Systems  No chest pain exertion.  No orthopnea or PND.  Comprehensive review of systems otherwise negative. Physical Exam  BP 126/70 (BP Location: Left Arm, Patient Position: Sitting, Cuff Size: Normal)   Pulse 76   Ht '5\' 9"'$  (1.753 m)   Wt 221 lb 9.6 oz (100.5 kg)   SpO2 98%   BMI 32.72 kg/m   Wt Readings from Last 5 Encounters:  04/23/22 221 lb 9.6 oz (100.5 kg)  03/02/22 215 lb (97.5 kg)  01/15/22 219 lb 6.4 oz (99.5 kg)  12/23/21 220 lb (99.8 kg)  09/16/21 224 lb 9.6 oz (101.9 kg)    BMI Readings from Last 5 Encounters:  04/23/22 32.72 kg/m  03/02/22 32.69 kg/m  01/15/22 33.36 kg/m  12/23/21 33.45 kg/m  09/16/21 34.15 kg/m     Physical Exam General: Sitting in chair, no acute  distress Eyes: EOMI, no icterus Neck: Supple, no JVP Cardiovascular: Warm, no edema Pulmonary: On room air, normal work of breathing Abdomen: Nondistended, bowel sounds present MSK: No synovitis, no joint effusion  neuro: Normal gait, no weakness Psych: Normal mood, full affect  Assessment & Plan:   Chronic cough: Present since September 2023 following COVID infection.  No real improvement with increase steroids over time for her rheumatologic disease.  But given what sounds like viral trigger, high suspicion for reactive airway/asthma.  Mild improvement in cough with PPI.  Encouraged to continue PPI.  Denies significant postnasal drip symptoms although occasional rhinorrhea.  Target airways with Symbicort high-dose 2 puff twice daily.  Assess response.  If not improving in the coming weeks, can trial intranasal therapies but seems low yield at this point.   Return in about 3 months (around 07/24/2022).   Lanier Clam, MD 04/23/2022

## 2022-04-25 ENCOUNTER — Other Ambulatory Visit (HOSPITAL_COMMUNITY): Payer: Self-pay

## 2022-04-27 ENCOUNTER — Other Ambulatory Visit (HOSPITAL_COMMUNITY): Payer: Self-pay

## 2022-05-01 ENCOUNTER — Telehealth: Payer: Self-pay | Admitting: Podiatry

## 2022-05-01 NOTE — Telephone Encounter (Signed)
Patient vm is full no answer . Tried to reach pt her orthotics are in    Balance  pending charges being posted

## 2022-05-04 ENCOUNTER — Ambulatory Visit: Payer: BC Managed Care – PPO | Attending: Physician Assistant | Admitting: Physician Assistant

## 2022-05-04 ENCOUNTER — Encounter: Payer: Self-pay | Admitting: Physician Assistant

## 2022-05-04 VITALS — BP 119/78 | HR 83 | Resp 14 | Ht 68.0 in | Wt 221.0 lb

## 2022-05-04 DIAGNOSIS — Z96652 Presence of left artificial knee joint: Secondary | ICD-10-CM

## 2022-05-04 DIAGNOSIS — M3320 Polymyositis, organ involvement unspecified: Secondary | ICD-10-CM

## 2022-05-04 DIAGNOSIS — M160 Bilateral primary osteoarthritis of hip: Secondary | ICD-10-CM

## 2022-05-04 DIAGNOSIS — M79642 Pain in left hand: Secondary | ICD-10-CM

## 2022-05-04 DIAGNOSIS — M3501 Sicca syndrome with keratoconjunctivitis: Secondary | ICD-10-CM

## 2022-05-04 DIAGNOSIS — Z7952 Long term (current) use of systemic steroids: Secondary | ICD-10-CM | POA: Diagnosis not present

## 2022-05-04 DIAGNOSIS — M19072 Primary osteoarthritis, left ankle and foot: Secondary | ICD-10-CM

## 2022-05-04 DIAGNOSIS — R5383 Other fatigue: Secondary | ICD-10-CM

## 2022-05-04 DIAGNOSIS — Z79899 Other long term (current) drug therapy: Secondary | ICD-10-CM

## 2022-05-04 DIAGNOSIS — I7300 Raynaud's syndrome without gangrene: Secondary | ICD-10-CM

## 2022-05-04 DIAGNOSIS — M5136 Other intervertebral disc degeneration, lumbar region: Secondary | ICD-10-CM

## 2022-05-04 DIAGNOSIS — N1831 Chronic kidney disease, stage 3a: Secondary | ICD-10-CM

## 2022-05-04 DIAGNOSIS — R700 Elevated erythrocyte sedimentation rate: Secondary | ICD-10-CM

## 2022-05-04 DIAGNOSIS — M1711 Unilateral primary osteoarthritis, right knee: Secondary | ICD-10-CM

## 2022-05-04 DIAGNOSIS — M8589 Other specified disorders of bone density and structure, multiple sites: Secondary | ICD-10-CM

## 2022-05-04 DIAGNOSIS — M79641 Pain in right hand: Secondary | ICD-10-CM

## 2022-05-04 DIAGNOSIS — M19071 Primary osteoarthritis, right ankle and foot: Secondary | ICD-10-CM

## 2022-05-04 DIAGNOSIS — Z8639 Personal history of other endocrine, nutritional and metabolic disease: Secondary | ICD-10-CM

## 2022-05-04 NOTE — Patient Instructions (Signed)
Standing Labs We placed an order today for your standing lab work.   Please have your standing labs drawn in April and every 3 months   Please have your labs drawn 2 weeks prior to your appointment so that the provider can discuss your lab results at your appointment, if possible.  Please note that you may see your imaging and lab results in MyChart before we have reviewed them. We will contact you once all results are reviewed. Please allow our office up to 72 hours to thoroughly review all of the results before contacting the office for clarification of your results.  WALK-IN LAB HOURS  Monday through Thursday from 8:00 am -12:30 pm and 1:00 pm-5:00 pm and Friday from 8:00 am-12:00 pm.  Patients with office visits requiring labs will be seen before walk-in labs.  You may encounter longer than normal wait times. Please allow additional time. Wait times may be shorter on  Monday and Thursday afternoons.  We do not book appointments for walk-in labs. We appreciate your patience and understanding with our staff.   Labs are drawn by [facility]. Please bring your co-pay at the time of your lab draw.  You may receive a bill from [facility] for your lab work.  Please note if you are on Hydroxychloroquine and and an order has been placed for a Hydroxychloroquine level,  you will need to have it drawn 4 hours or more after your last dose.  If you wish to have your labs drawn at another location, please call the office 24 hours in advance so we can fax the orders.  The office is located at [address], [address], [place], [place] [address]   If you have any questions regarding directions or hours of operation,  please call [phone].   As a reminder, please drink plenty of water prior to coming for your lab work. Thanks!  

## 2022-05-11 ENCOUNTER — Ambulatory Visit (INDEPENDENT_AMBULATORY_CARE_PROVIDER_SITE_OTHER): Payer: BC Managed Care – PPO

## 2022-05-11 DIAGNOSIS — M722 Plantar fascial fibromatosis: Secondary | ICD-10-CM | POA: Diagnosis not present

## 2022-05-11 NOTE — Progress Notes (Signed)
Patient presents today to pick up custom molded foot orthotics recommended by Dr. Milinda Pointer.   Orthotics were dispensed and fit was satisfactory. Reviewed instructions for break-in and wear. Written instructions given to patient.  Patient will follow up as needed.  Patient also left orthotics for refurbish. Will contact patient for pick up.

## 2022-06-22 ENCOUNTER — Other Ambulatory Visit: Payer: Self-pay | Admitting: Physician Assistant

## 2022-06-22 NOTE — Telephone Encounter (Signed)
Last Fill: 03/02/2022  Next Visit: 08/04/2022  Last Visit: 05/04/2022  Dx: Polymyositis   Current Dose per office note on 05/04/2022: prednisone 6 mg daily   Okay to refill Prednisone?

## 2022-07-21 NOTE — Progress Notes (Unsigned)
Office Visit Note  Patient: Connie Brady             Date of Birth: 04-Jun-1970           MRN: [id number]             PCP: Maurice Small, MD (Inactive) Referring: Maurice Small, MD Visit Date: 08/04/2022 Occupation: @GUAROCC @  Subjective:  Trochanteric bursitis of both hips   History of Present Illness: Connie Brady is a 52 y.o. female with history of polymyositis and osteoarthritis. Patient remains on Imuran 50 mg 3 tablets by mouth daily and Plaquenil 200mg  1 tablet by mouth daily. Started plaquenil in December 2023.  She is tolerating combination therapy without any side effects.  She remains on prednisone 6 mg daily.  She denies any myositis flare since her last office visit.  She has been having some increased discomfort in both hips on the lateral aspect.  Her symptoms are exacerbated by walking prolonged distances.  She denies any groin pain at this time.  She states she has had some increased discomfort in her neck and at times feels like there is a bee sting radiating from her neck.  She has not been evaluated for neck pain in the past.  She is continue to try to exercise on a regular basis.  She has noticed some increased fatigue which she attributes to recent diagnosis of anemia.  She has been started on ferrous sulfate and B12 supplementation. Patient continues to take pilocarpine 5 mg 1 to 2 tablets daily for dry mouth and uses Systane eyedrops for dry eyes.  She had an ophthalmology appointment yesterday and continues to see the dentist every 6 months as advised. She denies any recent or recurrent infections.  Activities of Daily Living:  Patient reports morning stiffness for all day. Patient Reports nocturnal pain.  Difficulty dressing/grooming: Denies Difficulty climbing stairs: Reports Difficulty getting out of chair: Reports Difficulty using hands for taps, buttons, cutlery, and/or writing: Denies  Review of Systems  Constitutional:  Positive for fatigue.   HENT:  Negative for mouth sores and mouth dryness.   Eyes:  Positive for itching and dryness. Negative for pain, redness and visual disturbance.  Respiratory:  Negative for shortness of breath.   Cardiovascular:  Negative for chest pain and palpitations.  Gastrointestinal:  Negative for blood in stool, constipation and diarrhea.  Endocrine: Negative for increased urination.  Genitourinary:  Negative for involuntary urination.  Musculoskeletal:  Positive for joint pain, joint pain, muscle weakness and morning stiffness. Negative for gait problem, joint swelling, myalgias, muscle tenderness and myalgias.  Skin:  Negative for color change, rash, hair loss and sensitivity to sunlight.  Allergic/Immunologic: Negative for susceptible to infections.  Neurological:  Positive for headaches. Negative for dizziness.  Hematological:  Negative for swollen glands.  Psychiatric/Behavioral:  Positive for depressed mood and sleep disturbance. The patient is nervous/anxious.     PMFS History:  Patient Active Problem List   Diagnosis Date Noted   Sjogren syndrome, unspecified (HCC) 05/22/2021   Pain of left lower leg 11/15/2020   Osteoarthritis of left knee 04/07/2019   Left lateral knee pain 08/24/2017   S/P arthroscopic surgery of left knee 08/24/2017   Abnormal cervical Papanicolaou smear 08/31/2016   Obesity 08/31/2016   Premenstrual symptom 08/31/2016   Vitamin D deficiency 08/25/2016   History of chronic kidney disease 05/07/2016   Chronic kidney disease (CKD), stage III (moderate) (HCC) 03/30/2016   High risk medication use 01/10/2016  Primary osteoarthritis of both hips 01/10/2016   Primary osteoarthritis of both knees 01/10/2016   Primary osteoarthritis of both feet 01/10/2016   Bilateral plantar fasciitis 01/10/2016   Chest pain 01/26/2013   Polymyositis (HCC) 01/26/2013   Sjoegren syndrome     Past Medical History:  Diagnosis Date   Anemia    Chronic kidney disease    stage 3    GERD (gastroesophageal reflux disease)    Iron deficiency    per patient   Low vitamin B12 level    per patient   Polymyositis (HCC)    also neuropathy of feet   Sjoegren syndrome     Family History  Problem Relation Age of Onset   Hypertension Sister    Hypertension Sister    Hypertension Sister    Hypertension Brother    Diabetes Brother    Hypertension Brother    Diabetes Brother    Past Surgical History:  Procedure Laterality Date   BUNIONECTOMY Bilateral    KNEE ARTHROPLASTY Left 2018   meniscal tear   KNEE ARTHROSCOPY WITH LATERAL MENISECTOMY Left 08/24/2017   Procedure: LEFT KNEE ARTHROSCOPY WITH LATERAL MENISECTOMY, CHONDROPLASTY, ARTHROSCOPIC ASSISTED INTERNAL FIXATION LATERAL TIBIAL PLATEAU;  Surgeon: Eugenia Mcalpine, MD;  Location: [facility];  Service: Orthopedics;  Laterality: Left;   KNEE SURGERY     right   MUSCLE BIOPSY     r/thigh   PLANTAR FASCIA SURGERY Right    TOTAL KNEE ARTHROPLASTY Left 04/07/2019   Procedure: TOTAL KNEE ARTHROPLASTY;  Surgeon: Eugenia Mcalpine, MD;  Location: [facility];  Service: Orthopedics;  Laterality: Left;  adductor canal   TOTAL SHOULDER ARTHROPLASTY Left 01/12/2017   Social History   Social History Narrative   Not on file   Immunization History  Administered Date(s) Administered   PFIZER(Purple Top)SARS-COV-2 Vaccination 08/17/2019, 09/07/2019, 06/30/2020     Objective: Vital Signs: BP 107/72 (BP Location: Left Arm, Patient Position: Sitting, Cuff Size: Large)   Pulse 90   Resp 17   Ht 5\' 8"  (1.727 m)   Wt 218 lb 6.4 oz (99.1 kg)   BMI 33.21 kg/m    Physical Exam Vitals and nursing note reviewed.  Constitutional:      Appearance: She is well-developed.  HENT:     Head: Normocephalic and atraumatic.  Eyes:     Conjunctiva/sclera: Conjunctivae normal.  Cardiovascular:     Rate and Rhythm: Normal rate and regular rhythm.     Heart sounds: Normal heart sounds.  Pulmonary:     Effort: Pulmonary  effort is normal.     Breath sounds: Normal breath sounds.  Abdominal:     General: Bowel sounds are normal.     Palpations: Abdomen is soft.  Musculoskeletal:     Cervical back: Normal range of motion.  Lymphadenopathy:     Cervical: No cervical adenopathy.  Skin:    General: Skin is warm and dry.     Capillary Refill: Capillary refill takes less than 2 seconds.  Neurological:     Mental Status: She is alert and oriented to person, place, and time.  Psychiatric:        Behavior: Behavior normal.      Musculoskeletal Exam: C-spine has painful ROM.  Trapezius muscle tension and tenderness bilaterally.  Shoulder joints, elbow joints, wrist joints, MCPs, PIPs, DIPs have good range of motion with no synovitis.  Complete fist formation bilaterally.  Hip joints have good range of motion with no groin pain.  Tenderness over bilateral  trochanteric bursa swelling and along the IT band bilaterally.  Left knee replacement has good range of motion with some warmth.  Right knee joint has good range of motion with no warmth or effusion.  Ankle joints have good range of motion with no tenderness or joint swelling.  CDAI Exam: CDAI Score: -- Patient Global: --; Provider Global: -- Swollen: --; Tender: -- Joint Exam 08/04/2022   No joint exam has been documented for this visit   There is currently no information documented on the homunculus. Go to the Rheumatology activity and complete the homunculus joint exam.  Investigation: No additional findings.  Imaging: No results found.  Recent Labs: Lab Results  Component Value Date   WBC 4.9 03/02/2022   HGB 12.9 03/02/2022   PLT 278 03/02/2022   NA 142 03/02/2022   K 4.4 03/02/2022   CL 107 03/02/2022   CO2 28 03/02/2022   GLUCOSE 104 (H) 03/02/2022   BUN 9 03/02/2022   CREATININE 1.27 (H) 03/02/2022   BILITOT 0.3 03/02/2022   ALKPHOS 57 04/04/2019   AST 16 03/02/2022   ALT 10 03/02/2022   PROT 6.9 03/02/2022   ALBUMIN 3.7 04/04/2019    CALCIUM 9.0 03/02/2022   GFRAA 50 (L) 08/02/2020    Speciality Comments: PLQ Eye Exam: 06/24/2021 WNL Follow up 1 year [facility]  Fosamax to start date December 17, 2019  Procedures:  No procedures performed Allergies: Lyrica [pregabalin] and Percocet [oxycodone-acetaminophen]     Assessment / Plan:     Visit Diagnoses: Polymyositis (HCC) - History of elevated CK, positive muscle biopsy, myositis panel negative, initial CK 522: She has not had any signs or symptoms of a myositis flare.  Her CK was 116 on 03/02/2022.  She is not experiencing any upper extremity or lower extremity muscle weakness at this time.  She had no difficulty rising her arms above her head or rising from a seated position.  CK will be rechecked today.  Overall her symptoms seem well-controlled taking Imuran 50 mg 3 tablets daily and Plaquenil 200 mg 1 tablet daily.  She remains on prednisone 6 mg daily.  No medication changes will be made at this time.  She was vies notify us if she develops signs or symptoms of a flare.  She will follow-up in the office in 3 months or sooner if needed.- Plan: CK  High risk medication use - Imuran 50 mg 3 tablets by mouth daily and Plaquenil 200mg  1 tablet by mouth daily. Started plaquenil in December 2023. CBC and CMP updated on 03/02/22. Orders for CBC and CMP released today.  Her next lab work will be due in September and every 3 months to monitor for drug toxicity. PLQ Eye Exam: 06/24/2021 WNL Follow up 1 year [facility].  An updated Plaquenil eye examination yesterday so we will call to obtain these results.  - Plan: COMPLETE METABOLIC PANEL WITH GFR, CBC with Differential/Platelet  Sjogren's syndrome with keratoconjunctivitis sicca (HCC) - ANA+. Ro+: Patient continues to have chronic sicca symptoms: She uses Systane eyedrops for symptoms of dry eyes.  She takes pilocarpine 5 mg 1 to 2 tablets.  Patient continues to see the dentist every 6 months.  She had an ophthalmology visit  yesterday.  We will call to obtain black eye examination results. She will remain on Imuran and Plaquenil as prescribed.  No signs of inflammatory arthritis were noted.  On prednisone therapy - Maintenance dose: Prednisone 6 mg by mouth daily. Unable to  taper.  Patient is aware of the long term risks of prednisone use.  Primary osteoarthritis of both hands - RF negative and anti-CCP negative on 04/30/2021.  X-rays of both hands were consistent with osteoarthritic changes on 06/13/2021.  No joint tenderness or synovitis on examination today.  Primary osteoarthritis of both hips: Good range of motion of both hip joints on examination today.  No groin pain noted.  Trochanteric bursitis of both hips: Patient presents today with pain on the lateral aspect of both hips consistent with trochanter bursitis and IT band syndrome bilaterally.  Different treatment options were discussed today in detail.  Patient opted for home exercises.  She was given a handout of exercises to perform.  If her symptoms persist or worsen she can return for a trochanteric bursa cortisone injection or we can refer her to physical therapy.  S/P total knee arthroplasty, left - Bone scan performed 08/22/2020: Focal delayed increased uptake of tracer at lateral left tibial plateau adjacent to prosthesis.  Slightly limited extension.  Warmth but no effusion.    Primary osteoarthritis of right knee: Good ROM with no discomfort.  No warmth or effusion.   Primary osteoarthritis of both feet: She is not experiencing any increased discomfort in her feet at this time.  She is wearing proper fitting shoes.  No synovitis of the ankle joints noted.  Neck pain: Patient has good range of motion of the C-spine with some discomfort with lateral rotation.  She has been experiencing intermittent neuralgias and has trapezius muscle tension and tenderness bilaterally.  Offered to obtain an x-ray of the C-spine today.  Patient would like to be evaluated by  a spine specialist.  Referral to Dr. Christell Constant will be placed for further evaluation.   DDD (degenerative disc disease), lumbar: She experiences intermittent discomfort in her lower back.  No symptoms of radiculopathy at this time.  Osteopenia of multiple sites - DEXA updated on 11/25/2021: LFN BMD 0.792.  T score -0.5.  All scans sites are stable.  Currently on a drug holiday from Fosamax (previously treated Feb 2022 to Nov 2023). She is taking vitamin D 5000 units once daily.  Stage 3a chronic kidney disease [facility]): Discussed the importance of avoiding NSAID use.  Creatinine was 1.27 and GFR was 51 on 03/02/22. CMP with GFR updated today.   Elevated sed rate: ESR was 45 on 03/02/2022.  No active inflammation was noted on examination today.  History of vitamin D deficiency: Vitamin D was 45 on 11/28/2019.  Other fatigue: She continues to have chronic fatigue.  She was recently diagnosed with anemia and has been started on ferrous sulfate as well as vitamin B-12 supplementation.  Raynaud's phenomenon without gangrene: Not currently active.  No digital ulcerations or signs of sclerodactyly noted.  Orders: Orders Placed This Encounter  Procedures   COMPLETE METABOLIC PANEL WITH GFR   CBC with Differential/Platelet   CK   No orders of the defined types were placed in this encounter.   Follow-Up Instructions: Return in about 3 months (around 11/04/2022) for Polymyositis, Osteoarthritis.   Gearldine Bienenstock, PA-C  Note - This record has been created using Dragon software.  Chart creation errors have been sought, but may not always  have been located. Such creation errors do not reflect on  the standard of medical care.

## 2022-07-29 ENCOUNTER — Other Ambulatory Visit: Payer: Self-pay | Admitting: Physician Assistant

## 2022-08-04 ENCOUNTER — Encounter: Payer: Self-pay | Admitting: Physician Assistant

## 2022-08-04 ENCOUNTER — Ambulatory Visit: Payer: BC Managed Care – PPO | Attending: Physician Assistant | Admitting: Physician Assistant

## 2022-08-04 VITALS — BP 107/72 | HR 90 | Resp 17 | Ht 68.0 in | Wt 218.4 lb

## 2022-08-04 DIAGNOSIS — Z7952 Long term (current) use of systemic steroids: Secondary | ICD-10-CM

## 2022-08-04 DIAGNOSIS — M19041 Primary osteoarthritis, right hand: Secondary | ICD-10-CM

## 2022-08-04 DIAGNOSIS — M8589 Other specified disorders of bone density and structure, multiple sites: Secondary | ICD-10-CM

## 2022-08-04 DIAGNOSIS — M19072 Primary osteoarthritis, left ankle and foot: Secondary | ICD-10-CM

## 2022-08-04 DIAGNOSIS — M542 Cervicalgia: Secondary | ICD-10-CM

## 2022-08-04 DIAGNOSIS — M3320 Polymyositis, organ involvement unspecified: Secondary | ICD-10-CM

## 2022-08-04 DIAGNOSIS — M7062 Trochanteric bursitis, left hip: Secondary | ICD-10-CM

## 2022-08-04 DIAGNOSIS — M160 Bilateral primary osteoarthritis of hip: Secondary | ICD-10-CM

## 2022-08-04 DIAGNOSIS — Z79899 Other long term (current) drug therapy: Secondary | ICD-10-CM | POA: Diagnosis not present

## 2022-08-04 DIAGNOSIS — M51369 Other intervertebral disc degeneration, lumbar region without mention of lumbar back pain or lower extremity pain: Secondary | ICD-10-CM

## 2022-08-04 DIAGNOSIS — M3501 Sicca syndrome with keratoconjunctivitis: Secondary | ICD-10-CM

## 2022-08-04 DIAGNOSIS — R700 Elevated erythrocyte sedimentation rate: Secondary | ICD-10-CM

## 2022-08-04 DIAGNOSIS — M19071 Primary osteoarthritis, right ankle and foot: Secondary | ICD-10-CM

## 2022-08-04 DIAGNOSIS — Z8639 Personal history of other endocrine, nutritional and metabolic disease: Secondary | ICD-10-CM

## 2022-08-04 DIAGNOSIS — R5383 Other fatigue: Secondary | ICD-10-CM

## 2022-08-04 DIAGNOSIS — M19042 Primary osteoarthritis, left hand: Secondary | ICD-10-CM

## 2022-08-04 DIAGNOSIS — I7300 Raynaud's syndrome without gangrene: Secondary | ICD-10-CM

## 2022-08-04 DIAGNOSIS — M5136 Other intervertebral disc degeneration, lumbar region: Secondary | ICD-10-CM

## 2022-08-04 DIAGNOSIS — Z96652 Presence of left artificial knee joint: Secondary | ICD-10-CM

## 2022-08-04 DIAGNOSIS — M7061 Trochanteric bursitis, right hip: Secondary | ICD-10-CM

## 2022-08-04 DIAGNOSIS — N1831 Chronic kidney disease, stage 3a: Secondary | ICD-10-CM

## 2022-08-04 DIAGNOSIS — M1711 Unilateral primary osteoarthritis, right knee: Secondary | ICD-10-CM

## 2022-08-04 LAB — CBC WITH DIFFERENTIAL/PLATELET
Absolute Monocytes: 363 cells/uL (ref 200–950)
Basophils Absolute: 20 cells/uL (ref 0–200)
Basophils Relative: 0.4 %
Eosinophils Absolute: 69 cells/uL (ref 15–500)
Eosinophils Relative: 1.4 %
HCT: 38.7 % (ref 35.0–45.0)
Hemoglobin: 12.6 g/dL (ref 11.7–15.5)
Lymphs Abs: 1303 cells/uL (ref 850–3900)
MCH: 28.8 pg (ref 27.0–33.0)
MCHC: 32.6 g/dL (ref 32.0–36.0)
MCV: 88.4 fL (ref 80.0–100.0)
MPV: 10.1 fL (ref 7.5–12.5)
Monocytes Relative: 7.4 %
Neutro Abs: 3146 cells/uL (ref 1500–7800)
Neutrophils Relative %: 64.2 %
Platelets: 232 10*3/uL (ref 140–400)
RBC: 4.38 10*6/uL (ref 3.80–5.10)
RDW: 12.9 % (ref 11.0–15.0)
Total Lymphocyte: 26.6 %
WBC: 4.9 10*3/uL (ref 3.8–10.8)

## 2022-08-04 LAB — COMPLETE METABOLIC PANEL WITH GFR
AG Ratio: 1.4 (calc) (ref 1.0–2.5)
ALT: 10 U/L (ref 6–29)
AST: 15 U/L (ref 10–35)
Albumin: 3.9 g/dL (ref 3.6–5.1)
Alkaline phosphatase (APISO): 53 U/L (ref 37–153)
BUN/Creatinine Ratio: 10 (calc) (ref 6–22)
BUN: 13 mg/dL (ref 7–25)
CO2: 28 mmol/L (ref 20–32)
Calcium: 9.1 mg/dL (ref 8.6–10.4)
Chloride: 107 mmol/L (ref 98–110)
Creat: 1.26 mg/dL — ABNORMAL HIGH (ref 0.50–1.03)
Globulin: 2.8 g/dL (calc) (ref 1.9–3.7)
Glucose, Bld: 90 mg/dL (ref 65–99)
Potassium: 4 mmol/L (ref 3.5–5.3)
Sodium: 141 mmol/L (ref 135–146)
Total Bilirubin: 0.5 mg/dL (ref 0.2–1.2)
Total Protein: 6.7 g/dL (ref 6.1–8.1)
eGFR: 51 mL/min/{1.73_m2} — ABNORMAL LOW (ref >=60)

## 2022-08-04 LAB — CK: Total CK: 215 U/L — ABNORMAL HIGH (ref 29–143)

## 2022-08-04 NOTE — Patient Instructions (Signed)
Iliotibial Band Syndrome Rehab Ask your health care provider which exercises are safe for you. Do exercises exactly as told by your health care provider and adjust them as directed. It is normal to feel mild stretching, pulling, tightness, or discomfort as you do these exercises. Stop right away if you feel sudden pain or your pain gets significantly worse. Do not begin these exercises until told by your health care provider. Stretching and range-of-motion exercises These exercises warm up your muscles and joints and improve the movement and flexibility of your hip and pelvis. Quadriceps stretch, prone  Lie on your abdomen (prone position) on a firm surface, such as a bed or padded floor. Bend your left / right knee and reach back to hold your ankle or pant leg. If you cannot reach your ankle or pant leg, loop a belt around your foot and grab the belt instead. Gently pull your heel toward your buttocks. Your knee should not slide out to the side. You should feel a stretch in the front of your thigh and knee (quadriceps). Hold this position for __________ seconds. Repeat __________ times. Complete this exercise __________ times a day. Iliotibial band stretch An iliotibial band is a strong band of muscle tissue that runs from the outer side of your hip to the outer side of your thigh and knee. Lie on your side with your left / right leg in the top position. Bend both of your knees and grab your left / right ankle. Stretch out your bottom arm to help you balance. Slowly bring your top knee back so your thigh goes behind your trunk. Slowly lower your top leg toward the floor until you feel a gentle stretch on the outside of your left / right hip and thigh. If you do not feel a stretch and your knee will not fall farther, place the heel of your other foot on top of your knee and pull your knee down toward the floor with your foot. Hold this position for __________ seconds. Repeat __________ times.  Complete this exercise __________ times a day. Strengthening exercises These exercises build strength and endurance in your hip and pelvis. Endurance is the ability to use your muscles for a long time, even after they get tired. Straight leg raises, side-lying This exercise strengthens the muscles that rotate the leg at the hip and move it away from your body (hip abductors). Lie on your side with your left / right leg in the top position. Lie so your head, shoulder, hip, and knee line up. You may bend your bottom knee to help you balance. Roll your hips slightly forward so your hips are stacked directly over each other and your left / right knee is facing forward. Tense the muscles in your outer thigh and lift your top leg 4-6 inches (10-15 cm). Hold this position for __________ seconds. Slowly lower your leg to return to the starting position. Let your muscles relax completely before doing another repetition. Repeat __________ times. Complete this exercise __________ times a day. Leg raises, prone This exercise strengthens the muscles that move the hips backward (hip extensors). Lie on your abdomen (prone position) on your bed or a firm surface. You can put a pillow under your hips if that is more comfortable for your lower back. Bend your left / right knee so your foot is straight up in the air. Squeeze your buttocks muscles and lift your left / right thigh off the bed. Do not let your back arch. Tense   your thigh muscle as hard as you can without increasing any knee pain. Hold this position for __________ seconds. Slowly lower your leg to return to the starting position and allow it to relax completely. Repeat __________ times. Complete this exercise __________ times a day. Hip hike Stand sideways on a bottom step. Stand on your left / right leg with your other foot unsupported next to the step. You can hold on to a railing or wall for balance if needed. Keep your knees straight and your  torso square. Then lift your left / right hip up toward the ceiling. Slowly let your left / right hip lower toward the floor, past the starting position. Your foot should get closer to the floor. Do not lean or bend your knees. Repeat __________ times. Complete this exercise __________ times a day. This information is not intended to replace advice given to you by your health care provider. Make sure you discuss any questions you have with your health care provider. Document Revised: 04/12/2019 Document Reviewed: 04/12/2019 Elsevier Patient Education  2024 [employer] Hip Bursitis Rehab Ask your health care provider which exercises are safe for you. Do exercises exactly as told by your health care provider and adjust them as directed. It is normal to feel mild stretching, pulling, tightness, or discomfort as you do these exercises. Stop right away if you feel sudden pain or your pain gets worse. Do not begin these exercises until told by your health care provider. Stretching exercise This exercise warms up your muscles and joints and improves the movement and flexibility of your hip. This exercise also helps to relieve pain and stiffness. Iliotibial band stretch An iliotibial band is a strong band of muscle tissue that runs from the outer side of your hip to the outer side of your thigh and knee. Lie on your side with your left / right leg in the top position. Bend your left / right knee and grab your ankle. Stretch out your bottom arm to help you balance. Slowly bring your knee back so your thigh is slightly behind your body. Slowly lower your knee toward the floor until you feel a gentle stretch on the outside of your left / right thigh. If you do not feel a stretch and your knee will not lower more toward the floor, place the heel of your other foot on top of your knee and pull your knee down toward the floor with your foot. Hold this position for __________ seconds. Slowly return to the  starting position. Repeat __________ times. Complete this exercise __________ times a day. Strengthening exercises These exercises build strength and endurance in your hip and pelvis. Endurance is the ability to use your muscles for a long time, even after they get tired. Bridge This exercise strengthens the muscles that move your thigh backward (hip extensors). Lie on your back on a firm surface with your knees bent and your feet flat on the floor. Tighten your buttocks muscles and lift your buttocks off the floor until your trunk is level with your thighs. Do not arch your back. You should feel the muscles working in your buttocks and the back of your thighs. If you do not feel these muscles, slide your feet 1-2 inches (2.5-5 cm) farther away from your buttocks. If this exercise is too easy, try doing it with your arms crossed over your chest. Hold this position for __________ seconds. Slowly lower your hips to the starting position. Let your muscles relax completely after  each repetition. Repeat __________ times. Complete this exercise __________ times a day. Squats This exercise strengthens the muscles in front of your thigh and knee (quadriceps). Stand in front of a table, with your feet and knees pointing straight ahead. You may rest your hands on the table for balance but not for support. Slowly bend your knees and lower your hips like you are going to sit in a chair. Keep your weight over your heels, not over your toes. Keep your lower legs upright so they are parallel with the table legs. Do not let your hips go lower than your knees. Do not bend lower than told by your health care provider. If your hip pain increases, do not bend as low. Hold the squat position for __________ seconds. Slowly push with your legs to return to standing. Do not use your hands to pull yourself to standing. Repeat __________ times. Complete this exercise __________ times a day. Hip hike  Stand  sideways on a bottom step. Stand on your left / right leg with your other foot unsupported next to the step. You can hold on to the railing or wall for balance if needed. Keep your knees straight and your torso square. Then lift your left / right hip up toward the ceiling. Hold this position for __________ seconds. Slowly let your left / right hip lower toward the floor, past the starting position. Your foot should get closer to the floor. Do not lean or bend your knees. Repeat __________ times. Complete this exercise __________ times a day. Single leg stand This exercise increases your balance. Without shoes, stand near a railing or in a doorway. You may hold on to the railing or door frame as needed for balance. Squeeze your left / right buttock muscles, then lift up your other foot. Do not let your left / right hip push out to the side. It is helpful to stand in front of a mirror for this exercise so you can watch your hip. Hold this position for __________ seconds. Repeat __________ times. Complete this exercise __________ times a day. This information is not intended to replace advice given to you by your health care provider. Make sure you discuss any questions you have with your health care provider. Document Revised: 01/15/2021 Document Reviewed: 01/15/2021 Elsevier Patient Education  2024 [employer].

## 2022-08-04 NOTE — Progress Notes (Signed)
CBC WNL

## 2022-08-04 NOTE — Addendum Note (Signed)
Addended by: Ellen Henri on: 08/04/2022 09:53 AM   Modules accepted: Orders

## 2022-08-04 NOTE — Progress Notes (Signed)
Creatinine remains elevated 1.26 and GFR was low at 51.  Please advise the patient to avoid NSAID use.  Please forward lab work to nephrology.  Rest of CMP within normal limits.  CK was slightly more elevated at 215.  Patient was not experiencing any increased muscular weakness today.  She has been taking Imuran, Plaquenil, and prednisone as prescribed.  Please advise patient to advise if she develops any signs or symptoms of a flare.  Recommend rechecking CK in 1 month.

## 2022-08-05 ENCOUNTER — Telehealth: Payer: Self-pay | Admitting: *Deleted

## 2022-08-05 DIAGNOSIS — M3320 Polymyositis, organ involvement unspecified: Secondary | ICD-10-CM

## 2022-08-05 NOTE — Telephone Encounter (Signed)
-----   Message from Gearldine Bienenstock, PA-C sent at 08/04/2022  4:48 PM EDT ----- Creatinine remains elevated 1.26 and GFR was low at 51.  Please advise the patient to avoid NSAID use.  Please forward lab work to nephrology.  Rest of CMP within normal limits.  CK was slightly more elevated at 215.  Patient was not experiencing any  increased muscular weakness today.  She has been taking Imuran, Plaquenil, and prednisone as prescribed.  Please advise patient to advise if she develops any signs or symptoms of a flare.  Recommend rechecking CK in 1 month.

## 2022-08-28 ENCOUNTER — Other Ambulatory Visit: Payer: Self-pay | Admitting: *Deleted

## 2022-08-28 NOTE — Telephone Encounter (Signed)
Refill request received via fax from [facility] [address] for Prednisone 1 mg   Last Fill: 03/02/2022  Next Visit: 11/10/2022  Last Visit: 08/04/2022  Dx: Polymyositis   Current Dose per office note on 08/04/2022: prednisone 6 mg daily.   Okay to refill Prednisone?

## 2022-08-31 ENCOUNTER — Other Ambulatory Visit: Payer: Self-pay

## 2022-08-31 ENCOUNTER — Ambulatory Visit: Payer: BC Managed Care – PPO | Admitting: Orthopedic Surgery

## 2022-08-31 ENCOUNTER — Encounter: Payer: Self-pay | Admitting: Orthopedic Surgery

## 2022-08-31 DIAGNOSIS — M542 Cervicalgia: Secondary | ICD-10-CM | POA: Diagnosis not present

## 2022-08-31 MED ORDER — METHOCARBAMOL 500 MG PO TABS: 500.0000 mg | ORAL_TABLET | Freq: Four times a day (QID) | ORAL | 0 refills | Status: AC | PRN

## 2022-08-31 MED ORDER — PREDNISONE 1 MG PO TABS: ORAL_TABLET | ORAL | 0 refills | Status: DC

## 2022-08-31 NOTE — Progress Notes (Signed)
Orthopedic Spine Surgery Office Note  Assessment: Patient is a 52 y.o. female with neck pain that radiates into the right trapezius, possible radiculopathy   Plan: -Explained that initially conservative treatment is tried as a significant number of patients may experience relief with these treatment modalities. Discussed that the conservative treatments include:  -activity modification  -physical therapy  -over the counter pain medications  -medrol dosepak  -cervical steroid injections -Patient has tried tylenol -Recommended PT and muscle relaxer (prescribed robaxin) -If she is not doing any better at our next visit, will order MRI to evaluate further -Patient should return to office in 6 weeks, x-rays at next visit: none   Patient expressed understanding of the plan and all questions were answered to the patient's satisfaction.   ___________________________________________________________________________   History:  Patient is a 52 y.o. female who presents today for cervical spine.  Patient has had 1 year of of neck pain that radiates into the right trapezius.  It does not radiate past the shoulder.  There is no trauma or injury that preceded the onset of pain.  She does not have any pain radiating into the left upper extremity.  Pain waxes and wanes in terms of intensity. She feels it in the caudal aspect of the neck. Has been using tylenol but it is not provided her with satisfactory relief. Is on chronic steroids and cannot use NSAIDs.    Weakness: denies Difficulty with fine motor skills (e.g., buttoning shirts, handwriting): denies Symptoms of imbalance: denies Paresthesias and numbness: sometimes in her fingers, no other numbness/paresthesias Bowel or bladder incontinence: denies Saddle anesthesia: denies  Treatments tried: tylenol  Review of systems: Denies fevers and chills, night sweats, unexplained weight loss, history of cancer. 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) Sjogrens Osteoporosis CKD Depression GERD Chronic pain RA  Allergies: percocet, lyrica  Past surgical history:  Bunionectomy Left knee arthroscopy with lateral meniscectomy Right TKA Plantar fascia surgery Left TSA  Social history: Denies use of nicotine product (smoking, vaping, patches, smokeless) Alcohol use: denies Denies recreational drug use  Physical Exam:  General: no acute distress, appears stated age Neurologic: alert, answering questions appropriately, following commands Respiratory: unlabored breathing on room air, symmetric chest rise Psychiatric: appropriate affect, normal cadence to speech   MSK (spine):  -Strength exam      Left  Right Grip strength                5/5  5/5 Interosseus   5/5   5/5 Wrist extension  5/5  5/5 Wrist flexion   5/5  5/5 Elbow flexion   5/5  5/5 Deltoid    5/5  5/5  EHL    5/5  5/5 TA    5/5  5/5 GSC    5/5  5/5 Knee extension  5/5  5/5 Hip flexion   5/5  5/5  -Sensory exam    Sensation intact to light touch in L3-S1 nerve distributions of bilateral lower extremities  Sensation intact to light touch in C5-T1 nerve distributions of bilateral upper extremities  -Brachioradialis DTR: 2/4 on the left, 2/4 on the right -Biceps DTR: 2/4 on the left, 2/4 on the right -Achilles DTR: 2/4 on the left, 2/4 on the right -Patellar tendon DTR: 2/4 on the left, 2/4 on the right  -Spurling: positive on the right, negative on the left -Hoffman sign: negative bilaterally -Clonus: no beats bilaterally -Interosseous wasting: none seen  -Grip and  release test: negative -Romberg: negative -Gait: normal -Imbalance with tandem gait: no  Left shoulder exam: no pain through range of motion, negative jobe test, negative belly press, no weakness with external rotation with arm at side Right shoulder exam: no pain through range of motion, negative jobe test, negative belly  press, no weakness with external rotation with arm at side  Imaging: XR of the cervical spine from 08/31/2022 was independently reviewed and interpreted, showing C4/5 and C5/6 disc height loss and anterior osteophyte formation. No other significant degenerative changes. No fracture or dislocation seen. No evidence of instability on flexion/extension views.    Patient name: Connie Brady Patient MRN: [id number] Date of visit: 08/31/22

## 2022-09-07 ENCOUNTER — Other Ambulatory Visit: Payer: Self-pay | Admitting: Physician Assistant

## 2022-09-07 NOTE — Telephone Encounter (Signed)
Last Fill: 04/20/2022  Labs: 08/04/2022 CBC WNL Creatinine remains elevated 1.26 and GFR was low at 51. Rest of CMP within normal limits.   Next Visit: 11/10/2022  Last Visit: 08/04/2022  DX: Polymyositis   Current Dose per office note 08/04/2022: Imuran 50 mg 3 tablets by mouth daily   Okay to refill Imuran?

## 2022-09-23 NOTE — Therapy (Addendum)
OUTPATIENT PHYSICAL THERAPY EVALUATION  / DISCHARGE   Patient Name: Connie Brady MRN: [id number] DOB:11/21/1970, 52 y.o., female Today's Date: 09/28/2022  END OF SESSION:  PT End of Session - 09/28/22 0759     Visit Number 1    Number of Visits 20    Date for PT Re-Evaluation 12/07/22    Authorization Type [employer] [id number] copay    PT Start Time 0802    PT Stop Time 0835    PT Time Calculation (min) 33 min    Activity Tolerance Patient tolerated treatment well    Behavior During Therapy WFL for tasks assessed/performed             Past Medical History:  Diagnosis Date   Anemia    Chronic kidney disease    stage 3   GERD (gastroesophageal reflux disease)    Iron deficiency    per patient   Low vitamin B12 level    per patient   Polymyositis (HCC)    also neuropathy of feet   Sjoegren syndrome    Past Surgical History:  Procedure Laterality Date   BUNIONECTOMY Bilateral    KNEE ARTHROPLASTY Left 2018   meniscal tear   KNEE ARTHROSCOPY WITH LATERAL MENISECTOMY Left 08/24/2017   Procedure: LEFT KNEE ARTHROSCOPY WITH LATERAL MENISECTOMY, CHONDROPLASTY, ARTHROSCOPIC ASSISTED INTERNAL FIXATION LATERAL TIBIAL PLATEAU;  Surgeon: Eugenia Mcalpine, MD;  Location: [facility];  Service: Orthopedics;  Laterality: Left;   KNEE SURGERY     right   MUSCLE BIOPSY     r/thigh   PLANTAR FASCIA SURGERY Right    TOTAL KNEE ARTHROPLASTY Left 04/07/2019   Procedure: TOTAL KNEE ARTHROPLASTY;  Surgeon: Eugenia Mcalpine, MD;  Location: [facility];  Service: Orthopedics;  Laterality: Left;  adductor canal   TOTAL SHOULDER ARTHROPLASTY Left 01/12/2017   Patient Active Problem List   Diagnosis Date Noted   Sjogren syndrome, unspecified (HCC) 05/22/2021   Pain of left lower leg 11/15/2020   Osteoarthritis of left knee 04/07/2019   Left lateral knee pain 08/24/2017   S/P arthroscopic surgery of left knee 08/24/2017   Abnormal cervical Papanicolaou smear 08/31/2016   Obesity  08/31/2016   Premenstrual symptom 08/31/2016   Vitamin D deficiency 08/25/2016   History of chronic kidney disease 05/07/2016   Chronic kidney disease (CKD), stage III (moderate) (HCC) 03/30/2016   High risk medication use 01/10/2016   Primary osteoarthritis of both hips 01/10/2016   Primary osteoarthritis of both knees 01/10/2016   Primary osteoarthritis of both feet 01/10/2016   Bilateral plantar fasciitis 01/10/2016   Chest pain 01/26/2013   Polymyositis (HCC) 01/26/2013   Sjoegren syndrome     PCP: Maurice Small MD  REFERRING PROVIDER: London Sheer, MD  REFERRING DIAG: M54.2 (ICD-10-CM) - Cervical pain  THERAPY DIAG:  Cervicalgia - Plan: PT plan of care cert/re-cert  Pain in thoracic spine - Plan: PT plan of care cert/re-cert  Abnormal posture - Plan: PT plan of care cert/re-cert  Rationale for Evaluation and Treatment: Rehabilitation  ONSET DATE: Approx. 1 year (09/2021)  SUBJECTIVE:  SUBJECTIVE STATEMENT: Insidious onset of symptoms about 1 year.  Complaints in neck and Rt upper shoulder region.  Denied arm radicular symptoms.  Pt indicated stinging at times, usually persistent.  Pt did not report specific aggravating factors.  Pt indicated having complaints while sleeping requiring turning over.  Reported can get irritated with prolonged sitting at times for work.   PERTINENT HISTORY:  CKD, GERD, polymyositis  PAIN:  NPRS scale: moderate /10 Pain location: cervical, Rt side  Pain description: persistent, stinging Aggravating factors: no specific reporting.  Relieving factors: OTC medicine at times  PRECAUTIONS: None  WEIGHT BEARING RESTRICTIONS: No  FALLS:  Has patient fallen in last 6 months? No  LIVING ENVIRONMENT: Lives in:  House/apartment  OCCUPATION: Sitting at desk.    PLOF: Independent, general exercise/free weights.  Rt hand dominant  PATIENT GOALS: Reduce pain.    OBJECTIVE:   IMAGING:  09/28/2022 review of chart:  XR of the cervical spine from 08/31/2022 was independently reviewed and interpreted, showing C4/5 and C5/6 disc height loss and anterior osteophyte formation. No other significant degenerative changes. No fracture or dislocation seen. No evidence of instability on flexion/extension views.   PATIENT SURVEYS:  09/28/2022 FOTO intake: 72   predicted:  73  COGNITION: 09/28/2022 Overall cognitive status: Within functional limits for tasks assessed  SENSATION: 09/28/2022 WFL  POSTURE:  09/28/2022 Mild forward  PALPATION: 09/28/2022 Upper trap trigger points with concordant symptoms Rt > Lt.   cPA with pain throughout upper and mid thoracic, cervical.  More tightness noted in thoracic region.    CERVICAL ROM:   ROM AROM (deg) 09/28/2022  Flexion 72 c central neck pain  Extension 43  Right lateral flexion   Left lateral flexion   Right rotation 80 c neck pain  Left rotation 62   (Blank rows = not tested)  UPPER EXTREMITY MMT:   MMT Right 09/28/2022 Left 09/28/2022  Shoulder flexion 5/5 5/5  Shoulder extension    Shoulder abduction 5/5 5/5  Shoulder adduction    Shoulder extension    Shoulder internal rotation 5/5 5/5  Shoulder external rotation 5/5 5/5  Elbow flexion 5/5 5/5  Elbow extension 5/5 5/5  Wrist flexion    Wrist extension    Wrist ulnar deviation    Wrist radial deviation    Wrist pronation    Wrist supination     (Blank rows = not tested)  UPPER EXTREMITY ROM:   Right 09/28/2022 Left 09/28/2022  Shoulder flexion    Shoulder extension    Shoulder abduction    Shoulder adduction    Shoulder extension    Shoulder internal rotation    Shoulder external rotation    Middle trapezius    Lower trapezius    Elbow flexion    Elbow extension    Wrist  flexion    Wrist extension    Wrist ulnar deviation    Wrist radial deviation    Wrist pronation    Wrist supination    Grip strength     (Blank rows = not tested)  CERVICAL SPECIAL TESTS:  09/28/2022 No specific testing indicate today.  TODAY'S TREATMENT:                                                                                                       DATE: 09/28/2022 Therex:    HEP instruction/performance c cues for techniques, handout provided.  Trial set performed of each for comprehension and symptom assessment.  See below for exercise list  Manual: Compression to Rt upper trap.  G2/G3 cPA upper thoracic and lower cervical (limited by pain response).   Trigger Point Dry-Needling  Treatment instructions: Expect mild to moderate muscle soreness. S/S of pneumothorax if dry needled over a lung field, and to seek immediate medical attention should they occur. Patient verbalized understanding of these instructions and education.  Patient Consent Given: Yes Education handout provided: Yes Muscles treated: Rt upper trap Treatment response/outcome: concordant twitch response with fair tolerance.    PATIENT EDUCATION:  09/28/2022 Education details: HEP, POC, DN handout  Person educated: Patient Education method: Explanation, Demonstration, Verbal cues, and Handouts Education comprehension: verbalized understanding, returned demonstration, and verbal cues required  HOME EXERCISE PROGRAM: Access Code: [email or web address] URL: [email or web address] Date: 09/28/2022 Prepared by: Chyrel Masson  Exercises - Supine Chin Tuck  - 1-2 x daily - 7 x weekly - 1 sets - 10 reps - 5 hold - Cervical Retraction at Wall  - 2 x daily - 7 x weekly - 1 sets - 10 reps - 5 hold - Prone Scapular Retraction  - 1-2 x  daily - 7 x weekly - 1 sets - 10 reps - 5 hold - Seated Scapular Retraction  - 3-5 x daily - 7 x weekly - 1 sets - 10 reps - 3-5 hold - Seated Upper Trapezius Stretch  - 2-3 x daily - 7 x weekly - 1 sets - 5 reps - 15 hold - Seated Thoracic Lumbar Extension  - 1-2 x daily - 7 x weekly - 1 sets - 10-15 reps - 3 hold  ASSESSMENT:  CLINICAL IMPRESSION: Patient is a 52 y.o. who comes to clinic with complaints of cervical/thoracic pain with mobility deficits with myofascial pain that impair their ability to perform usual daily and recreational functional activities without increase difficulty/symptoms at this time.  Patient to benefit from skilled PT services to address impairments and limitations to improve to previous level of function without restriction secondary to condition.    OBJECTIVE IMPAIRMENTS: decreased activity tolerance, decreased coordination, decreased endurance, decreased mobility, decreased ROM, hypomobility, increased fascial restrictions, impaired perceived functional ability, increased muscle spasms, impaired flexibility, impaired UE functional use, improper body mechanics, postural dysfunction, and pain.   ACTIVITY LIMITATIONS: carrying, lifting, bending, sitting, sleeping, bed mobility, and reach over head  PARTICIPATION LIMITATIONS: meal prep, cleaning, laundry, interpersonal relationship, driving, community activity, and occupation  PERSONAL FACTORS:  CKD, GERD, polymyositis  are also affecting patient's functional outcome.   REHAB POTENTIAL: Good  CLINICAL DECISION MAKING: Stable/uncomplicated  EVALUATION COMPLEXITY: Low   GOALS: Goals reviewed with patient? Yes  SHORT TERM GOALS: (target date for Short term goals are 3 weeks 10/19/2022)  1.Patient will demonstrate independent use of home  exercise program to maintain progress from in clinic treatments. Goal status: New  LONG TERM GOALS: (target dates for all long term goals are 10 weeks  12/07/2022 )   1.  Patient will demonstrate/report pain at worst less than or equal to 2/10 to facilitate minimal limitation in daily activity secondary to pain symptoms. Goal status: New   2. Patient will demonstrate independent use of home exercise program to facilitate ability to maintain/progress functional gains from skilled physical therapy services. Goal status: New   3. Patient will demonstrate FOTO outcome > or = 73 % to indicate reduced disability due to condition. Goal status: New   4.  Patient will demonstrate cervical AROM WFL s symptoms to facilitate usual head movements for daily activity including driving, self care.   Goal status: New   5.  Patient will demonstrate /report ability to work s restriction due to symptoms.   Goal status: New     PLAN:  PT FREQUENCY: 1-2x/week  PT DURATION: 10 weeks  PLANNED INTERVENTIONS: Therapeutic exercises, Therapeutic activity, Neuro Muscular re-education, Balance training, Gait training, Patient/Family education, Joint mobilization, Stair training, DME instructions, Dry Needling, Electrical stimulation, Cryotherapy, vasopneumatic device,Traction, Moist heat, Taping, Ultrasound, Ionotophoresis 4mg /ml Dexamethasone, and aquatic therapy, Manual therapy.  All included unless contraindicated  PLAN FOR NEXT SESSION: Check HEP use/response.  DN as desired.  Possible cervical and thoracic PAIVM.    Chyrel Masson, PT, DPT, OCS, ATC 09/28/22  8:56 AM   PHYSICAL THERAPY DISCHARGE SUMMARY  Visits from Start of Care: 1  Current functional level related to goals / functional outcomes: See note   Remaining deficits: See note   Education / Equipment: HEP  Patient goals were not met. Patient is being discharged due to not returning since the last visit.  Chyrel Masson, PT, DPT, OCS, ATC 11/12/22  1:48 PM

## 2022-09-24 ENCOUNTER — Other Ambulatory Visit: Payer: Self-pay | Admitting: Physician Assistant

## 2022-09-24 NOTE — Telephone Encounter (Signed)
Last Fill: 06/22/2022  Next Visit: 11/10/2022  Last Visit: 08/04/2022  Dx: Polymyositis   Current Dose per office note on 08/04/2022: prednisone 6 mg daily.   Okay to refill Prednisone?

## 2022-09-28 ENCOUNTER — Other Ambulatory Visit: Payer: Self-pay

## 2022-09-28 ENCOUNTER — Ambulatory Visit: Payer: BC Managed Care – PPO | Admitting: Rehabilitative and Restorative Service Providers"

## 2022-09-28 ENCOUNTER — Encounter: Payer: Self-pay | Admitting: Rehabilitative and Restorative Service Providers"

## 2022-09-28 DIAGNOSIS — R293 Abnormal posture: Secondary | ICD-10-CM

## 2022-09-28 DIAGNOSIS — M542 Cervicalgia: Secondary | ICD-10-CM | POA: Diagnosis not present

## 2022-09-28 DIAGNOSIS — M546 Pain in thoracic spine: Secondary | ICD-10-CM

## 2022-10-07 ENCOUNTER — Encounter: Payer: BC Managed Care – PPO | Admitting: Rehabilitative and Restorative Service Providers"

## 2022-10-12 ENCOUNTER — Ambulatory Visit: Payer: BC Managed Care – PPO | Admitting: Orthopedic Surgery

## 2022-10-14 ENCOUNTER — Encounter: Payer: BC Managed Care – PPO | Admitting: Rehabilitative and Restorative Service Providers"

## 2022-10-22 ENCOUNTER — Encounter: Payer: BC Managed Care – PPO | Admitting: Rehabilitative and Restorative Service Providers"

## 2022-10-27 NOTE — Progress Notes (Addendum)
Office Visit Note  Patient: Connie Brady             Date of Birth: Jul 28, 1970           MRN: [id number]             PCP: Maurice Small, MD (Inactive) Referring: Maurice Small, MD Visit Date: 11/10/2022 Occupation: @GUAROCC @  Subjective:  Left foot pain  History of Present Illness: Connie Brady is a 52 y.o. female with polymyositis, Sjogren's, osteoarthritis, and degenerative disc disease.  She returns today after her last visit in June 2024.  She states for the last 6 months she has been having some discomfort in her left foot.  She states she does have calluses but she also has the pain under the metatarsals of her foot.  She denies any increased muscular weakness or tenderness.  She has been exercising on a regular basis without any difficulty.  She denies any shortness of breath or difficulty swallowing.  She continues to have some stiffness in her hands, hips, knee joints and her feet.  She continues to have dry mouth and dry eye symptoms.  She also notices Raynauds when exposed to the colder temperatures.  Trochanteric bursitis has resolved.  Left total knee replacement is doing well.  She was evaluated by Dr. Christell Constant for this disease.  She did physical therapy.  She continues to have intermittent discomfort in her lower back.  She has been followed by Dr. Kathrene Bongo I think pretty much last year everything there for renal disease.  She had a upper respiratory tract month ago.  COVID-19 virus infection test was negative.  She still have some residual cough.    Activities of Daily Living:  Patient reports morning stiffness for all day. Patient Reports nocturnal pain.  Difficulty dressing/grooming: Denies Difficulty climbing stairs: Reports Difficulty getting out of chair: Reports Difficulty using hands for taps, buttons, cutlery, and/or writing: Denies  Review of Systems  Constitutional:  Positive for fatigue.  HENT:  Positive for mouth dryness. Negative for mouth  sores.   Eyes:  Negative for dryness.  Respiratory:  Negative for shortness of breath.   Cardiovascular:  Negative for chest pain and palpitations.  Gastrointestinal:  Negative for blood in stool, constipation and diarrhea.  Endocrine: Positive for increased urination.  Genitourinary:  Negative for involuntary urination.  Musculoskeletal:  Positive for joint pain, gait problem, joint pain and morning stiffness. Negative for joint swelling, myalgias, muscle weakness, muscle tenderness and myalgias.  Skin:  Positive for sensitivity to sunlight. Negative for color change, rash and hair loss.  Allergic/Immunologic: Negative for susceptible to infections.  Neurological:  Negative for dizziness and headaches.  Hematological:  Negative for swollen glands.  Psychiatric/Behavioral:  Positive for depressed mood and sleep disturbance. The patient is nervous/anxious.     PMFS History:  Patient Active Problem List   Diagnosis Date Noted   Sjogren syndrome, unspecified (HCC) 05/22/2021   Pain of left lower leg 11/15/2020   Osteoarthritis of left knee 04/07/2019   Left lateral knee pain 08/24/2017   S/P arthroscopic surgery of left knee 08/24/2017   Abnormal cervical Papanicolaou smear 08/31/2016   Obesity 08/31/2016   Premenstrual symptom 08/31/2016   Vitamin D deficiency 08/25/2016   History of chronic kidney disease 05/07/2016   Chronic kidney disease (CKD), stage III (moderate) (HCC) 03/30/2016   High risk medication use 01/10/2016   Primary osteoarthritis of both hips 01/10/2016   Primary osteoarthritis of both knees 01/10/2016   Primary  osteoarthritis of both feet 01/10/2016   Bilateral plantar fasciitis 01/10/2016   Chest pain 01/26/2013   Polymyositis (HCC) 01/26/2013   Sjoegren syndrome     Past Medical History:  Diagnosis Date   Anemia    Chronic kidney disease    stage 3   GERD (gastroesophageal reflux disease)    Iron deficiency    per patient   Low vitamin B12 level     per patient   Polymyositis (HCC)    also neuropathy of feet   Sjoegren syndrome     Family History  Problem Relation Age of Onset   Hypertension Sister    Hypertension Sister    Hypertension Sister    Hypertension Brother    Diabetes Brother    Hypertension Brother    Diabetes Brother    Past Surgical History:  Procedure Laterality Date   BUNIONECTOMY Bilateral    KNEE ARTHROPLASTY Left 2018   meniscal tear   KNEE ARTHROSCOPY WITH LATERAL MENISECTOMY Left 08/24/2017   Procedure: LEFT KNEE ARTHROSCOPY WITH LATERAL MENISECTOMY, CHONDROPLASTY, ARTHROSCOPIC ASSISTED INTERNAL FIXATION LATERAL TIBIAL PLATEAU;  Surgeon: Eugenia Mcalpine, MD;  Location: [facility];  Service: Orthopedics;  Laterality: Left;   KNEE SURGERY     right   MUSCLE BIOPSY     r/thigh   PLANTAR FASCIA SURGERY Right    TOTAL KNEE ARTHROPLASTY Left 04/07/2019   Procedure: TOTAL KNEE ARTHROPLASTY;  Surgeon: Eugenia Mcalpine, MD;  Location: [facility];  Service: Orthopedics;  Laterality: Left;  adductor canal   TOTAL SHOULDER ARTHROPLASTY Left 01/12/2017   Social History   Social History Narrative   Not on file   Immunization History  Administered Date(s) Administered   PFIZER(Purple Top)SARS-COV-2 Vaccination 08/17/2019, 09/07/2019, 06/30/2020     Objective: Vital Signs: BP 105/69 (BP Location: Left Arm, Patient Position: Sitting, Cuff Size: Large)   Pulse 71   Resp 15   Ht 5\' 8"  (1.727 m)   Wt 221 lb (100.2 kg)   BMI 33.60 kg/m    Physical Exam Vitals and nursing note reviewed.  Constitutional:      Appearance: She is well-developed.  HENT:     Head: Normocephalic and atraumatic.  Eyes:     Conjunctiva/sclera: Conjunctivae normal.  Cardiovascular:     Rate and Rhythm: Normal rate and regular rhythm.     Heart sounds: Normal heart sounds.  Pulmonary:     Effort: Pulmonary effort is normal.     Breath sounds: Normal breath sounds.  Abdominal:     General: Bowel sounds are normal.      Palpations: Abdomen is soft.  Musculoskeletal:     Cervical back: Normal range of motion.  Lymphadenopathy:     Cervical: No cervical adenopathy.  Skin:    General: Skin is warm and dry.     Capillary Refill: Capillary refill takes less than 2 seconds.  Neurological:     Mental Status: She is alert and oriented to person, place, and time.  Psychiatric:        Behavior: Behavior normal.      Musculoskeletal Exam: She had good range of motion of the cervical spine without any discomfort.  There was no tenderness over thoracic or lumbar spine.  Shoulders, elbows, wrist joints, MCPs PIPs and DIPs were in good range of motion with no synovitis.  Hip joints were in good range of motion without discomfort.  Left knee joint was replaced without any discomfort.  It was warm to touch.  Right  knee joint was in good range of motion without any warmth swelling or effusion.  She good range of motion of her ankles.  Bilateral bunionectomy scars were noted.  She had callus formation under her left fifth toe and also loss of fat pad under her left metatarsals.  No synovitis or tenderness was noted under her left foot.  CDAI Exam: CDAI Score: -- Patient Global: --; Provider Global: -- Swollen: --; Tender: -- Joint Exam 11/10/2022   No joint exam has been documented for this visit   There is currently no information documented on the homunculus. Go to the Rheumatology activity and complete the homunculus joint exam.  Investigation: No additional findings.  Imaging: No results found.  Recent Labs: Lab Results  Component Value Date   WBC 4.9 08/04/2022   HGB 12.6 08/04/2022   PLT 232 08/04/2022   NA 141 08/04/2022   K 4.0 08/04/2022   CL 107 08/04/2022   CO2 28 08/04/2022   GLUCOSE 90 08/04/2022   BUN 13 08/04/2022   CREATININE 1.26 (H) 08/04/2022   BILITOT 0.5 08/04/2022   ALKPHOS 57 04/04/2019   AST 15 08/04/2022   ALT 10 08/04/2022   PROT 6.7 08/04/2022   ALBUMIN 3.7 04/04/2019    CALCIUM 9.1 08/04/2022   GFRAA 50 (L) 08/02/2020    Speciality Comments: PLQ Eye Exam: 08/03/2022 WNL @ [facility].    Fosamax to start date December 17, 2019  Procedures:  No procedures performed Allergies: Lyrica [pregabalin] and Percocet [oxycodone-acetaminophen]   Assessment / Plan:     Visit Diagnoses: Polymyositis (HCC) - History of elevated CK, positive muscle biopsy, myositis panel negative, initial CK 522: -Patient denies any increased muscular weakness or tenderness.  She has been working out on a regular basis.  She had no difficulty getting up from the squatting position.  She had been taking Imuran and hydroxychloroquine along with prednisone without any interruption.  CK was 215 in June 2024.  Sed rate was elevated at 45 in January 2024.  Plan: Sedimentation rate, CK.  Increased risk of malignancy with polymyositis was discussed.  Patient has been getting all age-appropriate screening studies on an annual basis.  She denies any history of dysphagia or increased shortness of breath.  She had recent upper respiratory tract infection and has some residual cough.  Increased risk of ILD with polymyositis was also discussed.  High risk medication use - Imuran 50 mg 3 tablets by mouth daily and Plaquenil 200mg  1 tablet by mouth daily. Started plaquenil in December 2023.  Labs obtained on August 04, 2022 CBC and CMP were stable with creatinine elevated at 1.26.  CK was 215.-Her Plaquenil eye examination was on August 03, 2022.  Information on immunization was placed in the AVS.  Plan: CBC with Differential/Platelet, COMPLETE METABOLIC PANEL WITH GFR  Sjogren's syndrome with keratoconjunctivitis sicca (HCC) - ANA+. Ro+: She continues to take pilocarpine which helps.  Over-the-counter products were discussed.  Raynaud's phenomenon without gangrene-she has Raynaud's symptoms which are more prominent during the winter months.  Keeping core temperature warm and warm clothing was discussed.  On  prednisone therapy - Maintenance dose: Prednisone 6 mg by mouth daily. Unable to taper.  Increased risk of hypertension, diabetes, cataracts, osteoporosis with prednisone was discussed.  Primary osteoarthritis of both hands -no synovitis was noted.  RF negative and anti-CCP negative on 04/30/2021.  X-rays of both hands were consistent with osteoarthritic changes on 06/13/2021.  Primary osteoarthritis of both hips-she had good range  of motion without discomfort.  S/P total knee arthroplasty, left - Bone scan performed 08/22/2020: Focal delayed increased uptake of tracer at lateral left tibial plateau adjacent to prosthesis.  Primary osteoarthritis of right knee-she denies any discomfort today.  No warmth swelling or effusion was noted.  Primary osteoarthritis of both feet-she had bilateral bunionectomy in the past.  Pain in left foot-she complains of discomfort at the bottom of her left third fourth and fifth metatarsals.  She has lost fat pad.  She will benefit from metatarsal Hargis.  Patient states she has orthotics but she has not been using them.  She has callus formation under her left fifth toe.  Use of padded socks and shoes was advised.  Also metatarsal supports were advised.  Neck pain -patient was evaluated by Dr. Christell Constant.  She went to physical therapy.  DDD (degenerative disc disease), lumbar-she denies any discomfort in her lumbar spine.  Although she does have intermittent discomfort.  Osteopenia of multiple sites - DEXA: 11/25/2021: LFN BMD 0.792. T score -0.5.  All scans sites are stable. Currently on a drug holiday from Fosamax (previously treated Feb 2022 to Nov 2023).  She will get repeat DEXA scan in October 25.  Stage 3a chronic kidney disease (HCC) -followed by Dr. Kathrene Bongo  Other fatigue-improved.  Elevated sed rate-will repeat sed rate today.  Although she had recent upper respiratory tract infection.  History of vitamin D deficiency -she stopped taking vitamin D.  Will  check vitamin D level today.  Plan: VITAMIN D 25 Hydroxy (Vit-D Deficiency, Fractures)  Orders: Orders Placed This Encounter  Procedures   CBC with Differential/Platelet   COMPLETE METABOLIC PANEL WITH GFR   Sedimentation rate   CK   VITAMIN D 25 Hydroxy (Vit-D Deficiency, Fractures)   No orders of the defined types were placed in this encounter.  .  Follow-Up Instructions: Return in about 5 months (around 04/12/2023) for PM, Sjogren's, Osteoarthritis.   Pollyann Savoy, MD  Note - This record has been created using Animal nutritionist.  Chart creation errors have been sought, but may not always  have been located. Such creation errors do not reflect on  the standard of medical care.

## 2022-11-10 ENCOUNTER — Encounter: Payer: Self-pay | Admitting: Rheumatology

## 2022-11-10 ENCOUNTER — Ambulatory Visit: Payer: BC Managed Care – PPO | Attending: Rheumatology | Admitting: Rheumatology

## 2022-11-10 VITALS — BP 105/69 | HR 71 | Resp 15 | Ht 68.0 in | Wt 221.0 lb

## 2022-11-10 DIAGNOSIS — I7300 Raynaud's syndrome without gangrene: Secondary | ICD-10-CM

## 2022-11-10 DIAGNOSIS — M19071 Primary osteoarthritis, right ankle and foot: Secondary | ICD-10-CM

## 2022-11-10 DIAGNOSIS — M8589 Other specified disorders of bone density and structure, multiple sites: Secondary | ICD-10-CM

## 2022-11-10 DIAGNOSIS — Z7952 Long term (current) use of systemic steroids: Secondary | ICD-10-CM

## 2022-11-10 DIAGNOSIS — M19072 Primary osteoarthritis, left ankle and foot: Secondary | ICD-10-CM

## 2022-11-10 DIAGNOSIS — M3320 Polymyositis, organ involvement unspecified: Secondary | ICD-10-CM | POA: Diagnosis not present

## 2022-11-10 DIAGNOSIS — M79672 Pain in left foot: Secondary | ICD-10-CM

## 2022-11-10 DIAGNOSIS — M160 Bilateral primary osteoarthritis of hip: Secondary | ICD-10-CM

## 2022-11-10 DIAGNOSIS — Z79899 Other long term (current) drug therapy: Secondary | ICD-10-CM

## 2022-11-10 DIAGNOSIS — M3501 Sicca syndrome with keratoconjunctivitis: Secondary | ICD-10-CM

## 2022-11-10 DIAGNOSIS — N1831 Chronic kidney disease, stage 3a: Secondary | ICD-10-CM

## 2022-11-10 DIAGNOSIS — R5383 Other fatigue: Secondary | ICD-10-CM

## 2022-11-10 DIAGNOSIS — Z96652 Presence of left artificial knee joint: Secondary | ICD-10-CM

## 2022-11-10 DIAGNOSIS — M51369 Other intervertebral disc degeneration, lumbar region without mention of lumbar back pain or lower extremity pain: Secondary | ICD-10-CM

## 2022-11-10 DIAGNOSIS — M542 Cervicalgia: Secondary | ICD-10-CM

## 2022-11-10 DIAGNOSIS — M1711 Unilateral primary osteoarthritis, right knee: Secondary | ICD-10-CM

## 2022-11-10 DIAGNOSIS — M19041 Primary osteoarthritis, right hand: Secondary | ICD-10-CM

## 2022-11-10 DIAGNOSIS — R700 Elevated erythrocyte sedimentation rate: Secondary | ICD-10-CM

## 2022-11-10 DIAGNOSIS — M19042 Primary osteoarthritis, left hand: Secondary | ICD-10-CM

## 2022-11-10 DIAGNOSIS — M7061 Trochanteric bursitis, right hip: Secondary | ICD-10-CM

## 2022-11-10 DIAGNOSIS — M5136 Other intervertebral disc degeneration, lumbar region: Secondary | ICD-10-CM

## 2022-11-10 DIAGNOSIS — Z8639 Personal history of other endocrine, nutritional and metabolic disease: Secondary | ICD-10-CM

## 2022-11-10 NOTE — Patient Instructions (Signed)
Standing Labs We placed an order today for your standing lab work.   Please have your standing labs drawn in December and every 3 months  Please have your labs drawn 2 weeks prior to your appointment so that the provider can discuss your lab results at your appointment, if possible.  Please note that you may see your imaging and lab results in MyChart before we have reviewed them. We will contact you once all results are reviewed. Please allow our office up to 72 hours to thoroughly review all of the results before contacting the office for clarification of your results.  WALK-IN LAB HOURS  Monday through Thursday from 8:00 am -12:30 pm and 1:00 pm-5:00 pm and Friday from 8:00 am-12:00 pm.  Patients with office visits requiring labs will be seen before walk-in labs.  You may encounter longer than normal wait times. Please allow additional time. Wait times may be shorter on  Monday and Thursday afternoons.  We do not book appointments for walk-in labs. We appreciate your patience and understanding with our staff.   Labs are drawn by [facility]. Please bring your co-pay at the time of your lab draw.  You may receive a bill from [facility] for your lab work.  Please note if you are on Hydroxychloroquine and and an order has been placed for a Hydroxychloroquine level,  you will need to have it drawn 4 hours or more after your last dose.  If you wish to have your labs drawn at another location, please call the office 24 hours in advance so we can fax the orders.  The office is located at [address], [address], [place], [place] [address]   If you have any questions regarding directions or hours of operation,  please call [phone].   As a reminder, please drink plenty of water prior to coming for your lab work. Thanks!   Vaccines You are taking a medication(s) that can suppress your immune system.  The following immunizations are recommended: Flu annually Covid-19  RSV Td/Tdap (tetanus,  diphtheria, pertussis) every 10 years Pneumonia (Prevnar 15 then Pneumovax 23 at least 1 year apart.  Alternatively, can take Prevnar 20 without needing additional dose) Shingrix: 2 doses from 4 weeks to 6 months apart  Please check with your PCP to make sure you are up to date.  If you have signs or symptoms of an infection or start antibiotics: First, call your PCP for workup of your infection. Hold your medication through the infection, until you complete your antibiotics, and until symptoms resolve if you take the following: Injectable medication (Actemra, Benlysta, Cimzia, Cosentyx, Enbrel, Humira, Kevzara, Orencia, Remicade, Simponi, Stelara, Taltz, Tremfya) Methotrexate Leflunomide (Arava) Mycophenolate (Cellcept) Harriette Ohara, Olumiant, or Rinvoq

## 2022-11-11 ENCOUNTER — Ambulatory Visit: Payer: BC Managed Care – PPO | Admitting: Physician Assistant

## 2022-11-11 LAB — COMPLETE METABOLIC PANEL WITH GFR
AG Ratio: 1.5 (calc) (ref 1.0–2.5)
ALT: 10 U/L (ref 6–29)
AST: 13 U/L (ref 10–35)
Albumin: 3.8 g/dL (ref 3.6–5.1)
Alkaline phosphatase (APISO): 59 U/L (ref 37–153)
BUN/Creatinine Ratio: 9 (calc) (ref 6–22)
BUN: 12 mg/dL (ref 7–25)
CO2: 28 mmol/L (ref 20–32)
Calcium: 9.3 mg/dL (ref 8.6–10.4)
Chloride: 106 mmol/L (ref 98–110)
Creat: 1.33 mg/dL — ABNORMAL HIGH (ref 0.50–1.03)
Globulin: 2.6 g/dL (calc) (ref 1.9–3.7)
Glucose, Bld: 95 mg/dL (ref 65–99)
Potassium: 4 mmol/L (ref 3.5–5.3)
Sodium: 142 mmol/L (ref 135–146)
Total Bilirubin: 0.5 mg/dL (ref 0.2–1.2)
Total Protein: 6.4 g/dL (ref 6.1–8.1)
eGFR: 48 mL/min/{1.73_m2} — ABNORMAL LOW (ref >=60)

## 2022-11-11 LAB — CBC WITH DIFFERENTIAL/PLATELET
Absolute Monocytes: 476 cells/uL (ref 200–950)
Basophils Absolute: 29 cells/uL (ref 0–200)
Basophils Relative: 0.5 %
Eosinophils Absolute: 52 cells/uL (ref 15–500)
Eosinophils Relative: 0.9 %
HCT: 39.1 % (ref 35.0–45.0)
Hemoglobin: 12.3 g/dL (ref 11.7–15.5)
Lymphs Abs: 1885 cells/uL (ref 850–3900)
MCH: 28.3 pg (ref 27.0–33.0)
MCHC: 31.5 g/dL — ABNORMAL LOW (ref 32.0–36.0)
MCV: 90.1 fL (ref 80.0–100.0)
MPV: 9.8 fL (ref 7.5–12.5)
Monocytes Relative: 8.2 %
Neutro Abs: 3358 cells/uL (ref 1500–7800)
Neutrophils Relative %: 57.9 %
Platelets: 220 10*3/uL (ref 140–400)
RBC: 4.34 10*6/uL (ref 3.80–5.10)
RDW: 12.6 % (ref 11.0–15.0)
Total Lymphocyte: 32.5 %
WBC: 5.8 10*3/uL (ref 3.8–10.8)

## 2022-11-11 LAB — CK: Total CK: 191 U/L — ABNORMAL HIGH (ref 29–143)

## 2022-11-11 LAB — SEDIMENTATION RATE: Sed Rate: 19 mm/h (ref 0–30)

## 2022-11-11 LAB — VITAMIN D 25 HYDROXY (VIT D DEFICIENCY, FRACTURES): Vit D, 25-Hydroxy: 43 ng/mL (ref 30–100)

## 2022-11-11 NOTE — Progress Notes (Signed)
CBC is stable.  Creatinine is elevated higher than before.  CK is stable at 191.  Sed rate is normal.  Vitamin D is in the desirable range.  No change in treatment advised.  Please forward results to her nephrologist.

## 2022-12-11 ENCOUNTER — Other Ambulatory Visit: Payer: Self-pay | Admitting: Rheumatology

## 2022-12-11 ENCOUNTER — Other Ambulatory Visit: Payer: Self-pay | Admitting: Physician Assistant

## 2022-12-11 NOTE — Telephone Encounter (Signed)
Last Fill: 09/07/2022  Labs: 11/10/2022 CBC is stable.  Creatinine is elevated higher than before.  CK is stable at 191.  Sed rate is normal    Next Visit: 04/12/2023   Last Visit: 11/10/2022   DX: Polymyositis    Current Dose per office note 11/10/2022: Imuran 50 mg 3 tablets by mouth daily   Okay to refill Imuran?

## 2022-12-11 NOTE — Telephone Encounter (Signed)
Last Fill: 01/05/2022 (PLQ), 08/31/2022 (Prednisone)  Eye exam: 08/03/2022 WNL   Labs: 11/10/2022 CBC is stable.  Creatinine is elevated higher than before.  CK is stable at 191.  Sed rate is normal   Next Visit: 04/12/2023  Last Visit: 11/10/2022  DX: Polymyositis   Current Dose per office note 11/10/2022: Plaquenil 200mg  1 tablet by mouth daily   Okay to refill Plaquenil?

## 2022-12-27 ENCOUNTER — Other Ambulatory Visit: Payer: Self-pay | Admitting: Physician Assistant

## 2022-12-28 NOTE — Telephone Encounter (Signed)
Last Fill: 09/24/2022  Next Visit: 04/12/2023  Last Visit: 11/10/2022  Dx: Polymyositis   Current Dose per office note on 11/10/2022: not mentioned  Okay to refill Prednisone?

## 2023-01-27 ENCOUNTER — Other Ambulatory Visit: Payer: Self-pay | Admitting: *Deleted

## 2023-01-27 DIAGNOSIS — Z79899 Other long term (current) drug therapy: Secondary | ICD-10-CM

## 2023-01-27 DIAGNOSIS — M3320 Polymyositis, organ involvement unspecified: Secondary | ICD-10-CM

## 2023-01-28 LAB — COMPLETE METABOLIC PANEL WITH GFR
AG Ratio: 1.4 (calc) (ref 1.0–2.5)
ALT: 8 U/L (ref 6–29)
AST: 13 U/L (ref 10–35)
Albumin: 3.9 g/dL (ref 3.6–5.1)
Alkaline phosphatase (APISO): 62 U/L (ref 37–153)
BUN/Creatinine Ratio: 11 (calc) (ref 6–22)
BUN: 15 mg/dL (ref 7–25)
CO2: 29 mmol/L (ref 20–32)
Calcium: 9.2 mg/dL (ref 8.6–10.4)
Chloride: 106 mmol/L (ref 98–110)
Creat: 1.4 mg/dL — ABNORMAL HIGH (ref 0.50–1.03)
Globulin: 2.8 g/dL (ref 1.9–3.7)
Glucose, Bld: 98 mg/dL (ref 65–99)
Potassium: 4.5 mmol/L (ref 3.5–5.3)
Sodium: 140 mmol/L (ref 135–146)
Total Bilirubin: 0.4 mg/dL (ref 0.2–1.2)
Total Protein: 6.7 g/dL (ref 6.1–8.1)
eGFR: 45 mL/min/{1.73_m2} — ABNORMAL LOW (ref >=60)

## 2023-01-28 LAB — CBC WITH DIFFERENTIAL/PLATELET
Absolute Lymphocytes: 762 {cells}/uL — ABNORMAL LOW (ref 850–3900)
Absolute Monocytes: 352 {cells}/uL (ref 200–950)
Basophils Absolute: 32 {cells}/uL (ref 0–200)
Basophils Relative: 0.5 %
Eosinophils Absolute: 0 {cells}/uL — ABNORMAL LOW (ref 15–500)
Eosinophils Relative: 0 %
HCT: 38.8 % (ref 35.0–45.0)
Hemoglobin: 12.7 g/dL (ref 11.7–15.5)
MCH: 28.5 pg (ref 27.0–33.0)
MCHC: 32.7 g/dL (ref 32.0–36.0)
MCV: 87 fL (ref 80.0–100.0)
MPV: 10.1 fL (ref 7.5–12.5)
Monocytes Relative: 5.5 %
Neutro Abs: 5254 {cells}/uL (ref 1500–7800)
Neutrophils Relative %: 82.1 %
Platelets: 224 10*3/uL (ref 140–400)
RBC: 4.46 10*6/uL (ref 3.80–5.10)
RDW: 13.1 % (ref 11.0–15.0)
Total Lymphocyte: 11.9 %
WBC: 6.4 10*3/uL (ref 3.8–10.8)

## 2023-01-28 LAB — CK: Total CK: 171 U/L — ABNORMAL HIGH (ref 29–143)

## 2023-01-28 NOTE — Progress Notes (Signed)
Absolute lymphocytes and eosinophils are low.  We will continue to monitor. Rest of CBC WNL.   Creatinine remains elevated and GFR is low-45.  Avoid the use of NSAIDs and forward results to nephrology.   CK remains borderline elevated but continues to trend down-171.  No change in therapy recommended at this time.

## 2023-02-16 ENCOUNTER — Other Ambulatory Visit: Payer: Self-pay | Admitting: *Deleted

## 2023-02-16 MED ORDER — PILOCARPINE HCL 5 MG PO TABS: ORAL_TABLET | ORAL | 2 refills | Status: DC

## 2023-02-16 NOTE — Telephone Encounter (Signed)
 Refill request received via fax from [facility]  for Pilocarpine   Last Fill: 02/05/2022  Next Visit: 04/12/2023  Last Visit: 11/10/2022  Dx: Sjogren's syndrome with keratoconjunctivitis sicca   Current Dose per office note on 11/10/2022: continues to take pilocarpine  which helps.   Okay to refill Pilocarpine ?

## 2023-03-10 ENCOUNTER — Other Ambulatory Visit: Payer: Self-pay | Admitting: Rheumatology

## 2023-03-10 NOTE — Telephone Encounter (Signed)
Last Fill: 12/11/2023  Eye exam: 08/03/2022 WNL    Labs: 01/27/2023 Absolute lymphocytes and eosinophils are low. Rest of CBC WNL.   Creatinine remains elevated and GFR is low-45. CK remains borderline elevated but continues to trend down-171. No change in therapy recommended at this time.  Next Visit: 04/13/2023  Last Visit: 11/10/2022  [id number]   Current Dose per office note 11/10/2022: Plaquenil 200mg  1 tablet by mouth daily   Okay to refill Plaquenil?

## 2023-03-30 ENCOUNTER — Other Ambulatory Visit: Payer: Self-pay | Admitting: Rheumatology

## 2023-03-30 NOTE — Progress Notes (Signed)
 Office Visit Note  Patient: Connie Brady             Date of Birth: June 11, 1970           MRN: [id number]             PCP: Maurice Small, MD (Inactive) Referring: No ref. provider found Visit Date: 04/13/2023 Occupation: @GUAROCC @  Subjective:  Pain in both hips  History of Present Illness: Connie Brady is a 53 y.o. female with history of polymyositis and osteoarthritis.  Patient remains on Imuran 50 mg 3 tablets by mouth daily and Plaquenil 200mg  1 tablet by mouth daily. Started plaquenil in December 2023. She is tolerating combination therapy without any side effects.  She remains on prednisone 6 mg daily.  Patient presents today with some increased discomfort in both hips for the past 2 to 3 weeks.  She has not been exercising as frequently recently.  She describes the sensation as an aching pain.  She denies any joint swelling at this time.  Patient requested a refill of gabapentin to be sent to the pharmacy today along with lidocaine ointment. She continues to have chronic sicca symptoms.  She takes pilocarpine 5 mg 1 2 tablets daily as needed for symptomatic relief.  Patient has been seeing the dentist every 6 months and has been following up with ophthalmology.   Activities of Daily Living:  Patient reports morning stiffness for all day. Patient Denies nocturnal pain.  Difficulty dressing/grooming: Denies Difficulty climbing stairs: Reports Difficulty getting out of chair: Reports Difficulty using hands for taps, buttons, cutlery, and/or writing: Denies  Review of Systems  Constitutional:  Positive for fatigue.  HENT:  Positive for mouth dryness. Negative for mouth sores and nose dryness.   Eyes:  Negative for pain and dryness.  Respiratory:  Negative for shortness of breath and difficulty breathing.   Cardiovascular:  Negative for chest pain and palpitations.  Gastrointestinal:  Positive for constipation. Negative for blood in stool and diarrhea.  Endocrine:  Negative for increased urination.  Genitourinary:  Negative for involuntary urination.  Musculoskeletal:  Positive for joint pain, joint pain, myalgias, muscle weakness, morning stiffness, muscle tenderness and myalgias. Negative for gait problem and joint swelling.  Skin:  Positive for color change and sensitivity to sunlight. Negative for rash and hair loss.  Allergic/Immunologic: Negative for susceptible to infections.  Neurological:  Positive for headaches. Negative for dizziness.  Hematological:  Negative for swollen glands.  Psychiatric/Behavioral:  Positive for depressed mood and sleep disturbance. The patient is nervous/anxious.     PMFS History:  Patient Active Problem List   Diagnosis Date Noted   Sjogren syndrome, unspecified (HCC) 05/22/2021   Pain of left lower leg 11/15/2020   Osteoarthritis of left knee 04/07/2019   Left lateral knee pain 08/24/2017   S/P arthroscopic surgery of left knee 08/24/2017   Abnormal cervical Papanicolaou smear 08/31/2016   Obesity 08/31/2016   Premenstrual symptom 08/31/2016   Vitamin D deficiency 08/25/2016   History of chronic kidney disease 05/07/2016   Chronic kidney disease (CKD), stage III (moderate) (HCC) 03/30/2016   High risk medication use 01/10/2016   Primary osteoarthritis of both hips 01/10/2016   Primary osteoarthritis of both knees 01/10/2016   Primary osteoarthritis of both feet 01/10/2016   Bilateral plantar fasciitis 01/10/2016   Chest pain 01/26/2013   Polymyositis (HCC) 01/26/2013   Sjogren's syndrome (HCC)     Past Medical History:  Diagnosis Date   Anemia    Chronic  kidney disease    stage 3   GERD (gastroesophageal reflux disease)    Iron deficiency    per patient   Low vitamin B12 level    per patient   Polymyositis (HCC)    also neuropathy of feet   Sjoegren syndrome     Family History  Problem Relation Age of Onset   Hypertension Sister    Hypertension Sister    Hypertension Sister    Hypertension  Brother    Diabetes Brother    Hypertension Brother    Diabetes Brother    Past Surgical History:  Procedure Laterality Date   BUNIONECTOMY Bilateral    KNEE ARTHROPLASTY Left 2018   meniscal tear   KNEE ARTHROSCOPY WITH LATERAL MENISECTOMY Left 08/24/2017   Procedure: LEFT KNEE ARTHROSCOPY WITH LATERAL MENISECTOMY, CHONDROPLASTY, ARTHROSCOPIC ASSISTED INTERNAL FIXATION LATERAL TIBIAL PLATEAU;  Surgeon: Eugenia Mcalpine, MD;  Location: [facility];  Service: Orthopedics;  Laterality: Left;   KNEE SURGERY     right   MUSCLE BIOPSY     r/thigh   PLANTAR FASCIA SURGERY Right    TOTAL KNEE ARTHROPLASTY Left 04/07/2019   Procedure: TOTAL KNEE ARTHROPLASTY;  Surgeon: Eugenia Mcalpine, MD;  Location: [facility];  Service: Orthopedics;  Laterality: Left;  adductor canal   TOTAL SHOULDER ARTHROPLASTY Left 01/12/2017   Social History   Social History Narrative   Not on file   Immunization History  Administered Date(s) Administered   PFIZER(Purple Top)SARS-COV-2 Vaccination 08/17/2019, 09/07/2019, 06/30/2020     Objective: Vital Signs: BP 114/77 (BP Location: Left Arm, Patient Position: Sitting, Cuff Size: Normal)   Pulse 81   Resp 15   Ht 5\' 8"  (1.727 m)   Wt 218 lb 12.8 oz (99.2 kg)   BMI 33.27 kg/m    Physical Exam Vitals and nursing note reviewed.  Constitutional:      Appearance: She is well-developed.  HENT:     Head: Normocephalic and atraumatic.  Eyes:     Conjunctiva/sclera: Conjunctivae normal.  Cardiovascular:     Rate and Rhythm: Normal rate and regular rhythm.     Heart sounds: Normal heart sounds.  Pulmonary:     Effort: Pulmonary effort is normal.     Breath sounds: Normal breath sounds.  Abdominal:     General: Bowel sounds are normal.     Palpations: Abdomen is soft.  Musculoskeletal:     Cervical back: Normal range of motion.  Lymphadenopathy:     Cervical: No cervical adenopathy.  Skin:    General: Skin is warm and dry.     Capillary  Refill: Capillary refill takes less than 2 seconds.  Neurological:     Mental Status: She is alert and oriented to person, place, and time.  Psychiatric:        Behavior: Behavior normal.      Musculoskeletal Exam: C-spine has good range of motion.  Some midline spinal tenderness in the thoracic region.  Shoulder joints, elbow joints, wrist joints and MCPs and PIPs, DIPs have good range of motion with no synovitis.  Hip joints have good range of motion with no groin pain.  Tenderness over the trochanteric bursa bilaterally.  Left knee replacement has good range of motion.  Right knee joint has good range of motion with some discomfort.  Ankle joints have good range of motion with no tenderness or joint swelling.  CDAI Exam: CDAI Score: -- Patient Global: --; Provider Global: -- Swollen: --; Tender: -- Joint Exam 04/13/2023  No joint exam has been documented for this visit   There is currently no information documented on the homunculus. Go to the Rheumatology activity and complete the homunculus joint exam.  Investigation: No additional findings.  Imaging: No results found.  Recent Labs: Lab Results  Component Value Date   WBC 6.4 01/27/2023   HGB 12.7 01/27/2023   PLT 224 01/27/2023   NA 140 01/27/2023   K 4.5 01/27/2023   CL 106 01/27/2023   CO2 29 01/27/2023   GLUCOSE 98 01/27/2023   BUN 15 01/27/2023   CREATININE 1.40 (H) 01/27/2023   BILITOT 0.4 01/27/2023   ALKPHOS 57 04/04/2019   AST 13 01/27/2023   ALT 8 01/27/2023   PROT 6.7 01/27/2023   ALBUMIN 3.7 04/04/2019   CALCIUM 9.2 01/27/2023   GFRAA 50 (L) 08/02/2020    Speciality Comments: PLQ Eye Exam: 08/03/2022 WNL @ [facility].    Fosamax to start date December 17, 2019  Procedures:  No procedures performed Allergies: Lyrica [pregabalin] and Percocet [oxycodone-acetaminophen]    Assessment / Plan:     Visit Diagnoses: Polymyositis (HCC) - History of elevated CK, positive muscle biopsy, myositis  panel negative, initial CK 522: She has not had any signs or symptoms of a myositis flare.  She has full strength of upper and lower extremities on examination today.  She remains on Imuran 50 mg 3 tablets by mouth daily and Plaquenil 200 mg 1 tablet by mouth daily. She has not had any recent gaps in therapy.  No recent or recurrent infections. She is also taking prednisone 6 mg daily and has been unable to taper. CK updated on 01/27/23-171. CK order released today. Discussed the importance of remaining up-to-date with age-related cancer screenings given the increased risk for malignancy in patients with myositis. She will remain on the following treatment regimen as prescribed.  She was advised to notify us if she develops signs or symptoms of a flare.  She will follow-up in the office in 5 months or sooner if needed.  - Plan: CK, Sedimentation rate  High risk medication use - Imuran 50 mg 3 tablets by mouth daily and Plaquenil 200mg  1 tablet by mouth daily. Started plaquenil in December 2023. Prednisone 6 mg daily--hemoglobin A1c checked today. CBC and CMP updated on 01/27/23.  Orders for CBC and CMP released today.   No recent or recurrent infections.  - Plan: CBC with Differential/Platelet, COMPLETE METABOLIC PANEL WITH GFR  Sjogren's syndrome with keratoconjunctivitis sicca (HCC) - ANA+. Ro+: Patient continues to have chronic sicca symptoms which have been manageable.  Discussed the importance of seeing the dentist every 6 months and ophthalmology at least on a yearly basis.  She plans to continue to take pilocarpine 5 mg 1-2 tablets daily as needed for symptomatic relief.  Raynaud's phenomenon without gangrene: Not currently symptomatic.  No digital ulcerations or signs of gangrene.  On prednisone therapy - Maintenance dose: Prednisone 6 mg by mouth daily. Unable to taper.  Plan to check hemoglobin A1c today.  Primary osteoarthritis of both hands - RF negative and anti-CCP negative on  04/30/2021.  X-rays of both hands were consistent with osteoarthritic changes on 06/13/2021.  No synovitis noted on examination today.  She is able to make a complete fist bilaterally.  Primary osteoarthritis of both hips - Patient presents today with increased discomfort in both hips consistent with trochanteric bursitis.  On examination today she has good range of motion with no groin pain.  She has  been having increased on the lateral aspect of both hips for the past 2 weeks.  No recent injury or fall.  Plan to check sed rate and CK today.  She will notify us if her symptoms persist or worsen.  She was given a handout of exercises to perform.  Plan: Sedimentation rate  Trochanteric bursitis of both hips - She presents today with pain on the lateral aspect of both hips consistent with trochanteric bursitis.  Patient was provided a handout of exercises to perform.  Plan: Sedimentation rate  Diabetes mellitus screening - Patient remains on long term prednisone 6 mg daily.  Hgb A1c updated today.  Plan: HgB A1c  S/P total knee arthroplasty, left: Doing well. Good ROM with no discomfort.   Primary osteoarthritis of right knee: She has had some intermittent discomfort in her right knee.  She has good ROM of the right knee with no warmth or effusion.   Primary osteoarthritis of both feet: Patient continues to experience intermittent discomfort in both feet.  She is good range of motion of both ankle joints with no tenderness or joint swelling.  Patient requested a refill of gabapentin to be sent to the pharmacy which helps to alleviate the discomfort in her feet.  Neck pain: C-spine has good range of motion with no discomfort at this time.   Degeneration of intervertebral disc of lumbar region without discogenic back pain or lower extremity pain: Intermittent discomfort.   Osteopenia of multiple sites - DEXA: 11/25/2021: LFN BMD 0.792. T score -0.5.  All scans sites are stable. Currently on a drug  holiday from Fosamax (previously treated Feb 2022 to Nov 2023). Due to update DEXA October 2025.   History of vitamin D deficiency: She is taking vitamin D 5000 units daily.   Stage 3a chronic kidney disease (HCC): Creatinine was 1.40 and GFR was 45 on 01/27/23.     Orders: Orders Placed This Encounter  Procedures   CBC with Differential/Platelet   COMPLETE METABOLIC PANEL WITH GFR   CK   HgB A1c   Sedimentation rate   Meds ordered this encounter  Medications   gabapentin (NEURONTIN) 300 MG capsule    Sig: Take 1 capsule (300 mg total) by mouth 2 (two) times daily.    Dispense:  180 capsule    Refill:  0   lidocaine (XYLOCAINE) 5 % ointment    Sig: APPLY TO THE AFFECTED AREA(S) DAILY AS NEEDED    Dispense:  30 g    Refill:  0      Follow-Up Instructions: Return in about 5 months (around 09/10/2023) for Polymyositis, Sjogren's syndrome.   Gearldine Bienenstock, PA-C  Note - This record has been created using Dragon software.  Chart creation errors have been sought, but may not always  have been located. Such creation errors do not reflect on  the standard of medical care.

## 2023-03-30 NOTE — Telephone Encounter (Signed)
Last Fill: 12/11/2022   Labs: 21/12/2022 Absolute lymphocytes and eosinophils are low.  We will continue to monitor. Rest of CBC WNL.   Creatinine remains elevated and GFR is low-45.  Next Visit: 04/13/2023  Last Visit: 11/10/2022  DX: Polymyositis   Current Dose per office note 11/10/2022: Imuran 50 mg 3 tablets by mouth daily   Okay to refill Arava ?

## 2023-04-12 ENCOUNTER — Ambulatory Visit: Payer: BC Managed Care – PPO | Admitting: Physician Assistant

## 2023-04-13 ENCOUNTER — Ambulatory Visit: Payer: 59 | Attending: Physician Assistant | Admitting: Physician Assistant

## 2023-04-13 ENCOUNTER — Encounter: Payer: Self-pay | Admitting: Physician Assistant

## 2023-04-13 VITALS — BP 114/77 | HR 81 | Resp 15 | Ht 68.0 in | Wt 218.8 lb

## 2023-04-13 DIAGNOSIS — M19071 Primary osteoarthritis, right ankle and foot: Secondary | ICD-10-CM

## 2023-04-13 DIAGNOSIS — Z79899 Other long term (current) drug therapy: Secondary | ICD-10-CM

## 2023-04-13 DIAGNOSIS — Z96652 Presence of left artificial knee joint: Secondary | ICD-10-CM

## 2023-04-13 DIAGNOSIS — Z8639 Personal history of other endocrine, nutritional and metabolic disease: Secondary | ICD-10-CM

## 2023-04-13 DIAGNOSIS — M7061 Trochanteric bursitis, right hip: Secondary | ICD-10-CM

## 2023-04-13 DIAGNOSIS — N1831 Chronic kidney disease, stage 3a: Secondary | ICD-10-CM

## 2023-04-13 DIAGNOSIS — M3320 Polymyositis, organ involvement unspecified: Secondary | ICD-10-CM

## 2023-04-13 DIAGNOSIS — M7062 Trochanteric bursitis, left hip: Secondary | ICD-10-CM

## 2023-04-13 DIAGNOSIS — M19042 Primary osteoarthritis, left hand: Secondary | ICD-10-CM

## 2023-04-13 DIAGNOSIS — M8589 Other specified disorders of bone density and structure, multiple sites: Secondary | ICD-10-CM

## 2023-04-13 DIAGNOSIS — M542 Cervicalgia: Secondary | ICD-10-CM

## 2023-04-13 DIAGNOSIS — I7300 Raynaud's syndrome without gangrene: Secondary | ICD-10-CM | POA: Diagnosis not present

## 2023-04-13 DIAGNOSIS — M51369 Other intervertebral disc degeneration, lumbar region without mention of lumbar back pain or lower extremity pain: Secondary | ICD-10-CM

## 2023-04-13 DIAGNOSIS — M3501 Sicca syndrome with keratoconjunctivitis: Secondary | ICD-10-CM | POA: Diagnosis not present

## 2023-04-13 DIAGNOSIS — M19072 Primary osteoarthritis, left ankle and foot: Secondary | ICD-10-CM

## 2023-04-13 DIAGNOSIS — Z7952 Long term (current) use of systemic steroids: Secondary | ICD-10-CM

## 2023-04-13 DIAGNOSIS — M160 Bilateral primary osteoarthritis of hip: Secondary | ICD-10-CM

## 2023-04-13 DIAGNOSIS — Z131 Encounter for screening for diabetes mellitus: Secondary | ICD-10-CM

## 2023-04-13 DIAGNOSIS — M1711 Unilateral primary osteoarthritis, right knee: Secondary | ICD-10-CM

## 2023-04-13 DIAGNOSIS — M19041 Primary osteoarthritis, right hand: Secondary | ICD-10-CM

## 2023-04-13 MED ORDER — GABAPENTIN 300 MG PO CAPS: 300.0000 mg | ORAL_CAPSULE | Freq: Two times a day (BID) | ORAL | 0 refills | Status: AC

## 2023-04-13 MED ORDER — LIDOCAINE 5 % EX OINT: TOPICAL_OINTMENT | CUTANEOUS | 0 refills | Status: AC

## 2023-04-13 NOTE — Patient Instructions (Addendum)
 Hip Bursitis Rehab Ask your health care provider which exercises are safe for you. Do exercises exactly as told by your health care provider and adjust them as directed. It is normal to feel mild stretching, pulling, tightness, or discomfort as you do these exercises. Stop right away if you feel sudden pain or your pain gets worse. Do not begin these exercises until told by your health care provider. Stretching exercise This exercise warms up your muscles and joints and improves the movement and flexibility of your hip. This exercise also helps to relieve pain and stiffness. Iliotibial band stretch An iliotibial band is a strong band of muscle tissue that runs from the outer side of your hip to the outer side of your thigh and knee. Lie on your side with your left / right leg in the top position. Bend your left / right knee and grab your ankle. Stretch out your bottom arm to help you balance. Slowly bring your knee back so your thigh is slightly behind your body. Slowly lower your knee toward the floor until you feel a gentle stretch on the outside of your left / right thigh. If you do not feel a stretch and your knee will not lower more toward the floor, place the heel of your other foot on top of your knee and pull your knee down toward the floor with your foot. Hold this position for __________ seconds. Slowly return to the starting position. Repeat __________ times. Complete this exercise __________ times a day. Strengthening exercises These exercises build strength and endurance in your hip and pelvis. Endurance is the ability to use your muscles for a long time, even after they get tired. Bridge This exercise strengthens the muscles that move your thigh backward (hip extensors). Lie on your back on a firm surface with your knees bent and your feet flat on the floor. Tighten your buttocks muscles and lift your buttocks off the floor until your trunk is level with your thighs. Do not arch your  back. You should feel the muscles working in your buttocks and the back of your thighs. If you do not feel these muscles, slide your feet 1-2 inches (2.5-5 cm) farther away from your buttocks. If this exercise is too easy, try doing it with your arms crossed over your chest. Hold this position for __________ seconds. Slowly lower your hips to the starting position. Let your muscles relax completely after each repetition. Repeat __________ times. Complete this exercise __________ times a day. Squats This exercise strengthens the muscles in front of your thigh and knee (quadriceps). Stand in front of a table, with your feet and knees pointing straight ahead. You may rest your hands on the table for balance but not for support. Slowly bend your knees and lower your hips like you are going to sit in a chair. Keep your weight over your heels, not over your toes. Keep your lower legs upright so they are parallel with the table legs. Do not let your hips go lower than your knees. Do not bend lower than told by your health care provider. If your hip pain increases, do not bend as low. Hold the squat position for __________ seconds. Slowly push with your legs to return to standing. Do not use your hands to pull yourself to standing. Repeat __________ times. Complete this exercise __________ times a day. Hip hike  Stand sideways on a bottom step. Stand on your left / right leg with your other foot unsupported next to  the step. You can hold on to the railing or wall for balance if needed. Keep your knees straight and your torso square. Then lift your left / right hip up toward the ceiling. Hold this position for __________ seconds. Slowly let your left / right hip lower toward the floor, past the starting position. Your foot should get closer to the floor. Do not lean or bend your knees. Repeat __________ times. Complete this exercise __________ times a day. Single leg stand This exercise increases  your balance. Without shoes, stand near a railing or in a doorway. You may hold on to the railing or door frame as needed for balance. Squeeze your left / right buttock muscles, then lift up your other foot. Do not let your left / right hip push out to the side. It is helpful to stand in front of a mirror for this exercise so you can watch your hip. Hold this position for __________ seconds. Repeat __________ times. Complete this exercise __________ times a day.  Standing Labs We placed an order today for your standing lab work.   Please have your standing labs drawn in May and every 3 months  Please have your labs drawn 2 weeks prior to your appointment so that the provider can discuss your lab results at your appointment, if possible.  Please note that you may see your imaging and lab results in MyChart before we have reviewed them. We will contact you once all results are reviewed. Please allow our office up to 72 hours to thoroughly review all of the results before contacting the office for clarification of your results.  WALK-IN LAB HOURS  Monday through Thursday from 8:00 am -12:30 pm and 1:00 pm-5:00 pm and Friday from 8:00 am-12:00 pm.  Patients with office visits requiring labs will be seen before walk-in labs.  You may encounter longer than normal wait times. Please allow additional time. Wait times may be shorter on  Monday and Thursday afternoons.  We do not book appointments for walk-in labs. We appreciate your patience and understanding with our staff.   Labs are drawn by [facility]. Please bring your co-pay at the time of your lab draw.  You may receive a bill from [facility] for your lab work.  Please note if you are on Hydroxychloroquine and and an order has been placed for a Hydroxychloroquine level,  you will need to have it drawn 4 hours or more after your last dose.  If you wish to have your labs drawn at another location, please call the office 24 hours in advance so we can  fax the orders.  The office is located at [address], [address], [place], [place] [address]   If you have any questions regarding directions or hours of operation,  please call [phone].   As a reminder, please drink plenty of water prior to coming for your lab work. Thanks!

## 2023-04-13 NOTE — Progress Notes (Signed)
 CBC WNL

## 2023-04-13 NOTE — Progress Notes (Signed)
 ESR WNL

## 2023-04-14 LAB — CBC WITH DIFFERENTIAL/PLATELET
Absolute Lymphocytes: 1816 {cells}/uL (ref 850–3900)
Absolute Monocytes: 464 {cells}/uL (ref 200–950)
Basophils Absolute: 31 {cells}/uL (ref 0–200)
Basophils Relative: 0.6 %
Eosinophils Absolute: 31 {cells}/uL (ref 15–500)
Eosinophils Relative: 0.6 %
HCT: 40.6 % (ref 35.0–45.0)
Hemoglobin: 12.9 g/dL (ref 11.7–15.5)
MCH: 28.5 pg (ref 27.0–33.0)
MCHC: 31.8 g/dL — ABNORMAL LOW (ref 32.0–36.0)
MCV: 89.6 fL (ref 80.0–100.0)
MPV: 9.7 fL (ref 7.5–12.5)
Monocytes Relative: 9.1 %
Neutro Abs: 2759 {cells}/uL (ref 1500–7800)
Neutrophils Relative %: 54.1 %
Platelets: 223 10*3/uL (ref 140–400)
RBC: 4.53 10*6/uL (ref 3.80–5.10)
RDW: 12.4 % (ref 11.0–15.0)
Total Lymphocyte: 35.6 %
WBC: 5.1 10*3/uL (ref 3.8–10.8)

## 2023-04-14 LAB — COMPLETE METABOLIC PANEL WITH GFR
AG Ratio: 1.3 (calc) (ref 1.0–2.5)
ALT: 8 U/L (ref 6–29)
AST: 14 U/L (ref 10–35)
Albumin: 3.9 g/dL (ref 3.6–5.1)
Alkaline phosphatase (APISO): 62 U/L (ref 37–153)
BUN/Creatinine Ratio: 10 (calc) (ref 6–22)
BUN: 13 mg/dL (ref 7–25)
CO2: 27 mmol/L (ref 20–32)
Calcium: 9.3 mg/dL (ref 8.6–10.4)
Chloride: 105 mmol/L (ref 98–110)
Creat: 1.3 mg/dL — ABNORMAL HIGH (ref 0.50–1.03)
Globulin: 2.9 g/dL (ref 1.9–3.7)
Glucose, Bld: 80 mg/dL (ref 65–99)
Potassium: 3.9 mmol/L (ref 3.5–5.3)
Sodium: 141 mmol/L (ref 135–146)
Total Bilirubin: 0.6 mg/dL (ref 0.2–1.2)
Total Protein: 6.8 g/dL (ref 6.1–8.1)
eGFR: 49 mL/min/{1.73_m2} — ABNORMAL LOW (ref >=60)

## 2023-04-14 LAB — HEMOGLOBIN A1C
Hgb A1c MFr Bld: 5.6 %{Hb} (ref <5.7)
Mean Plasma Glucose: 114 mg/dL
eAG (mmol/L): 6.3 mmol/L

## 2023-04-14 LAB — CK: Total CK: 199 U/L (ref 21–240)

## 2023-04-14 LAB — SEDIMENTATION RATE: Sed Rate: 28 mm/h (ref 0–30)

## 2023-04-14 NOTE — Progress Notes (Signed)
 Hgb A1C is 5.6%.  CK WNL.  Creatinine remains elevated-1.30 and GFR is low-49. Avoid the use of NSAIDs and forward results to the patients nephrologist

## 2023-04-28 ENCOUNTER — Ambulatory Visit: Payer: Self-pay | Admitting: Pulmonary Disease

## 2023-04-28 ENCOUNTER — Encounter: Payer: Self-pay | Admitting: Pulmonary Disease

## 2023-04-28 VITALS — BP 123/78 | HR 80 | Temp 97.9°F | Ht 68.0 in | Wt 219.4 lb

## 2023-04-28 DIAGNOSIS — K219 Gastro-esophageal reflux disease without esophagitis: Secondary | ICD-10-CM | POA: Diagnosis not present

## 2023-04-28 DIAGNOSIS — R053 Chronic cough: Secondary | ICD-10-CM | POA: Diagnosis not present

## 2023-04-28 MED ORDER — BREZTRI AEROSPHERE 160-9-4.8 MCG/ACT IN AERO: 2.0000 | INHALATION_SPRAY | Freq: Two times a day (BID) | RESPIRATORY_TRACT | Status: AC

## 2023-04-28 NOTE — Progress Notes (Signed)
 @Patient  ID: Connie Brady, female    DOB: 1970/11/02, 53 y.o.   MRN: [id number]  Chief Complaint  Patient presents with   Follow-up    Some chest congestion and cough and night    Referring provider: No ref. provider found  HPI:   53 y.o. woman whom we are seeing for evaluation of chronic cough.  Most recent rheumatology note  reviewed.    At last visit started on Symbicort.  No real improvement as well.  A bit overdue for follow-up.  Remains on PPI.  Denies any nasal allergies, runny nose postnasal drip etc.  Discussed trying different inhaler.  Discussed CT scan of the no evidence of bronchiectasis in the past.  Discussed repeating CT scan if not improving, discussed referral to ENT, laryngologist at [facility] in the future if not improving.  Discussed trying nasal sprays as well but likely low yield given lack of symptoms.  HPI initial visit: Patient contracted COVID September, late September 2023.  Since then has had daily cough.  Intermittently productive.  Sometimes dry.  No position make things better or worse.  No seasonal environmental factors she can identify to make things better or worse.  No real time and things are better or worse.  Has intermittent increased doses and prednisone for her rheumatology disease.  She notes no improvement in cough with increasing the dosing.  She is on chronic 6 mg prednisone daily.  Recently started PPI about 2 to 3 weeks ago.  This has helped the frequency of cough.  The cough still remains.  Review chest x-ray 03/2022 that on my review interpretation reveals clear lungs bilaterally, slightly elevated left hemidiaphragm.  PMH: Sjogren's, polymyositis Family history: Sister with hypertension, father with diabetes and hypertension Surgical history: Knee surgery bilaterally, shoulder surgery Social history: Never smoker, lives in [place], [place] Past Surgical History:  Procedure Laterality Date   BUNIONECTOMY Bilateral     KNEE ARTHROPLASTY Left 2018   meniscal tear   KNEE ARTHROSCOPY WITH LATERAL MENISECTOMY Left 08/24/2017   Procedure: LEFT KNEE ARTHROSCOPY WITH LATERAL MENISECTOMY, CHONDROPLASTY, ARTHROSCOPIC ASSISTED INTERNAL FIXATION LATERAL TIBIAL PLATEAU;  Surgeon: Eugenia Mcalpine, MD;  Location: [facility];  Service: Orthopedics;  Laterality: Left;   KNEE SURGERY     right   MUSCLE BIOPSY     r/thigh   PLANTAR FASCIA SURGERY Right    TOTAL KNEE ARTHROPLASTY Left 04/07/2019   Procedure: TOTAL KNEE ARTHROPLASTY;  Surgeon: Eugenia Mcalpine, MD;  Location: [facility];  Service: Orthopedics;  Laterality: Left;  adductor canal   TOTAL SHOULDER ARTHROPLASTY Left 01/12/2017    Family History  Problem Relation Age of Onset   Hypertension Sister    Hypertension Sister    Hypertension Sister    Hypertension Brother    Diabetes Brother    Hypertension Brother    Diabetes Health and safety inspector / Pulmonary Flowsheets:   ACT:      No data to display          MMRC:     No data to display          Epworth:      No data to display          Tests:   FENO:  No results found for: "NITRICOXIDE"  PFT:     No data to display          WALK:      No data to display  Imaging: Personally reviewed and as per EMR discussion No results found.  Lab Results: Personally reviewed and as per EMR CBC    Component Value Date/Time   WBC 5.1 04/13/2023 0853   RBC 4.53 04/13/2023 0853   HGB 12.9 04/13/2023 0853   HCT 40.6 04/13/2023 0853   PLT 223 04/13/2023 0853   MCV 89.6 04/13/2023 0853   MCH 28.5 04/13/2023 0853   MCHC 31.8 (L) 04/13/2023 0853   RDW 12.4 04/13/2023 0853   LYMPHSABS 1,885 11/10/2022 0828   MONOABS 330 08/20/2016 1406   EOSABS 31 04/13/2023 0853   BASOSABS 31 04/13/2023 0853    BMET    Component Value Date/Time   NA 141 04/13/2023 0853   NA 138 08/22/2018 0958   K 3.9 04/13/2023 0853   CL 105 04/13/2023 0853   CO2 27  04/13/2023 0853   GLUCOSE 80 04/13/2023 0853   BUN 13 04/13/2023 0853   BUN 12 08/22/2018 0958   CREATININE 1.30 (H) 04/13/2023 0853   CALCIUM 9.3 04/13/2023 0853   GFRNONAA 43 (L) 08/02/2020 1050   GFRAA 50 (L) 08/02/2020 1050    BNP No results found for: "BNP"  ProBNP No results found for: "PROBNP"  Specialty Problems   None   Allergies  Allergen Reactions   Lyrica [Pregabalin] Itching   Percocet [Oxycodone-Acetaminophen] Rash and Other (See Comments)    Hallucinations     Immunization History  Administered Date(s) Administered   PFIZER(Purple Top)SARS-COV-2 Vaccination 08/17/2019, 09/07/2019, 06/30/2020    Past Medical History:  Diagnosis Date   Anemia    Chronic kidney disease    stage 3   GERD (gastroesophageal reflux disease)    Iron deficiency    per patient   Low vitamin B12 level    per patient   Polymyositis (HCC)    also neuropathy of feet   Sjoegren syndrome     Tobacco History: Social History   Tobacco Use  Smoking Status Never   Passive exposure: Past  Smokeless Tobacco Never   Counseling given: Not Answered   Continue to not smoke  Outpatient Encounter Medications as of 04/28/2023  Medication Sig   azaTHIOprine (IMURAN) 50 MG tablet TAKE 3 TABLETS(150 MG) BY MOUTH DAILY   budeson-glycopyrrolate-formoterol (BREZTRI AEROSPHERE) 160-9-4.8 MCG/ACT AERO Inhale 2 puffs into the lungs in the morning and at bedtime.   budesonide-formoterol (SYMBICORT) 160-4.5 MCG/ACT inhaler Inhale 2 puffs into the lungs in the morning and at bedtime. (Patient taking differently: Inhale 2 puffs into the lungs 2 (two) times daily as needed (for coughing or wheezing).)   Cholecalciferol (VITAMIN D-3) 125 MCG (5000 UT) TABS 1 tablet Orally Once a day   Cyanocobalamin (VITAMIN B 12 PO) Take by mouth.   dexlansoprazole (DEXILANT) 60 MG capsule Take 60 mg by mouth daily.   DULoxetine (CYMBALTA) 30 MG capsule Take 30 mg by mouth daily.   DULoxetine (CYMBALTA) 60 MG  capsule Take 60 mg by mouth daily.    estradiol-norethindrone (ACTIVELLA) 1-0.5 MG tablet Take 1 tablet by mouth daily in the afternoon. 5 pm   ferrous sulfate 325 (65 FE) MG tablet Take 325 mg by mouth daily with breakfast.   gabapentin (NEURONTIN) 100 MG capsule    gabapentin (NEURONTIN) 300 MG capsule Take 1 capsule (300 mg total) by mouth 2 (two) times daily.   hydroxychloroquine (PLAQUENIL) 200 MG tablet TAKE 1 TABLET(200 MG) BY MOUTH DAILY   lidocaine (XYLOCAINE) 5 % ointment APPLY TO THE AFFECTED AREA(S) DAILY AS NEEDED  Lisdexamfetamine Dimesylate 60 MG CHEW Chew 1 tablet (60 mg total) by mouth in the morning.   omeprazole (PRILOSEC) 40 MG capsule 1 capsule 30 minutes before morning meal Orally Once a day for 90 days   pilocarpine (SALAGEN) 5 MG tablet TAKE 1 TO 2 TABLETS BY MOUTH DAILY AS NEEDED   Polyethyl Glycol-Propyl Glycol (SYSTANE OP) Place 1 drop into both eyes 2 (two) times daily as needed (dry eyes).   predniSONE (DELTASONE) 1 MG tablet TAKE 1 TABLET BY MOUTH EVERY DAY WITH 5MG  TABLET   predniSONE (DELTASONE) 5 MG tablet TAKE 1 TABLET(5 MG) BY MOUTH DAILY WITH BREAKFAST   traZODone (DESYREL) 150 MG tablet Take 150 mg by mouth at bedtime.   VITAMIN D PO Take by mouth daily.   [DISCONTINUED] cariprazine (VRAYLAR) 1.5 MG capsule Take 3 mg by mouth at bedtime. (Patient not taking: Reported on 05/04/2022)   No facility-administered encounter medications on file as of 04/28/2023.     Review of Systems  Review of Systems  N/a Physical Exam  BP 123/78 (BP Location: Left Arm, Patient Position: Sitting, Cuff Size: Large)   Pulse 80   Temp 97.9 F (36.6 C) (Oral)   Ht 5\' 8"  (1.727 m)   Wt 219 lb 6.4 oz (99.5 kg)   SpO2 99%   BMI 33.36 kg/m   Wt Readings from Last 5 Encounters:  04/28/23 219 lb 6.4 oz (99.5 kg)  04/13/23 218 lb 12.8 oz (99.2 kg)  11/10/22 221 lb (100.2 kg)  08/04/22 218 lb 6.4 oz (99.1 kg)  05/04/22 221 lb (100.2 kg)    BMI Readings from Last 5  Encounters:  04/28/23 33.36 kg/m  04/13/23 33.27 kg/m  11/10/22 33.60 kg/m  08/04/22 33.21 kg/m  05/04/22 33.60 kg/m     Physical Exam General: Sitting in chair, no acute distress Eyes: EOMI, no icterus Neck: Supple, no JVP Cardiovascular: Warm, no edema Pulmonary: On room air, normal work of breathing Abdomen: Nondistended, bowel sounds present MSK: No synovitis, no joint effusion  neuro: Normal gait, no weakness Psych: Normal mood, full affect  Assessment & Plan:   Chronic cough: Present since September 2023 following COVID infection.  No real improvement with increase steroids over time for her rheumatologic disease.  But given what sounds like viral trigger, high suspicion for reactive airway/asthma.  Mild improvement in cough with PPI.  Encouraged to continue PPI.  Denies significant postnasal drip symptoms although occasional rhinorrhea.  No improvement with Symbicort.  Escalate to [facility] for additional bronchodilation and see if this helps.  Will consider CT scan and nasal sprays in the future if not improving in the coming weeks.  If does improve with Markus Daft can prescribe long-term.  Plan referral to ENT, voice center at [facility] if no further improvement.  GERD: Continue PPI.  Denies heartburn symptoms.  Silent reflux possible contributor cough.  Hard to tease out.   Return in about 3 months (around 07/29/2023) for f/u Dr. Judeth Horn.   Karren Burly, MD 04/28/2023

## 2023-04-28 NOTE — Patient Instructions (Signed)
 Nice to see you again  Use Breztri 2 puffs in the morning, 2 puffs in the evening, rinse your mouth after every use  Send me a message in about 3 weeks of note if cough is any better or worse, if not I think we can move forward with a CT scan out of abundance of caution just make sure not missing anything, recommend some nasal spray antihistamine, and consider sending a referral to an ENT doctor who specializes in the larynx to evaluate other issues with cough etc. I may be missing.  Return to clinic in 3 months or sooner as needed with Dr. Judeth Horn

## 2023-05-07 ENCOUNTER — Other Ambulatory Visit: Payer: Self-pay | Admitting: Rheumatology

## 2023-05-07 NOTE — Telephone Encounter (Signed)
 Last Fill: 12/28/2022  Next Visit: 09/14/2023  Last Visit: 04/13/2023  Dx: Polymyositis   Current Dose per office note on 04/13/2023: Prednisone 6 mg daily   Okay to refill Prednisone?

## 2023-06-17 ENCOUNTER — Other Ambulatory Visit: Payer: Self-pay | Admitting: Physician Assistant

## 2023-06-17 NOTE — Telephone Encounter (Signed)
 Last Fill: 03/10/2023  Eye exam: 08/03/2022 WNL   Labs: 04/13/2023 CK WNL. CBC WNL ESR WNL  Creatinine remains elevated-1.30 and GFR is low-49.  Next Visit: 09/14/2023  Last Visit: 04/13/2023  RU:[id number]   Current Dose per office note 04/13/2023: Plaquenil  200mg  1 tablet by mouth daily.   Okay to refill Plaquenil ?

## 2023-06-23 ENCOUNTER — Other Ambulatory Visit: Payer: Self-pay | Admitting: Internal Medicine

## 2023-06-23 ENCOUNTER — Ambulatory Visit
Admission: RE | Admit: 2023-06-23 | Discharge: 2023-06-23 | Disposition: A | Discharge status: Home / Self Care | Source: Ambulatory Visit | Attending: Internal Medicine

## 2023-06-23 DIAGNOSIS — M25551 Pain in right hip: Secondary | ICD-10-CM

## 2023-06-24 ENCOUNTER — Other Ambulatory Visit: Payer: Self-pay | Admitting: Physician Assistant

## 2023-06-24 NOTE — Telephone Encounter (Signed)
 Last Fill: 03/30/2023  Labs: 04/13/2023 Hgb A1C is 5.6%. CK WNL. Creatinine remains elevated-1.30 and GFR is low-49. Avoid the use of NSAIDs and forward results to the patients nephrologist  Next Visit: 09/14/2023  Last Visit: 04/13/2023  DX: Polymyositis (HCC)   Current Dose per office note 04/13/2023: Imuran  50 mg 3 tablets by mouth daily   Okay to refill Imuran ?

## 2023-07-13 ENCOUNTER — Other Ambulatory Visit: Payer: Self-pay | Admitting: Physician Assistant

## 2023-07-13 ENCOUNTER — Other Ambulatory Visit: Payer: Self-pay | Admitting: Rheumatology

## 2023-07-13 NOTE — Telephone Encounter (Signed)
 Last Fill: 03/30/2023  Next Visit: 09/14/2023  Last Visit: 04/13/2023  Dx: Polymyositis   Current Dose per office note on 04/13/2023:  Prednisone  6 mg daily   Okay to refill Prednisone ?

## 2023-07-13 NOTE — Telephone Encounter (Signed)
 Last Fill: 05/09/2023   Next Visit: 09/14/2023   Last Visit: 04/13/2023   Dx: Polymyositis    Current Dose per office note on 04/13/2023:  Prednisone  6 mg daily    Okay to refill Prednisone ?

## 2023-07-15 ENCOUNTER — Ambulatory Visit: Payer: Self-pay | Admitting: Surgery

## 2023-07-20 ENCOUNTER — Other Ambulatory Visit: Payer: Self-pay | Admitting: Rheumatology

## 2023-07-20 NOTE — Pre-Procedure Instructions (Signed)
 Surgical Instructions   Your procedure is scheduled on July 26, 2023. Report to [facility] Main Entrance "A" at 12:30 P.M., then check in with the Admitting office. Any questions or running late day of surgery: call [phone]  Questions prior to your surgery date: call [phone], Monday-Friday, 8am-4pm. If you experience any cold or flu symptoms such as cough, fever, chills, shortness of breath, etc. between now and your scheduled surgery, please notify us  at the above number.     Remember:  Do not eat or drink after midnight the night before your surgery   Take these medicines the morning of surgery with A SIP OF WATER : DULoxetine  (CYMBALTA )  estradiol  (ESTRACE )  gabapentin  (NEURONTIN )  omeprazole (PRILOSEC)  predniSONE  (DELTASONE )    May take these medicines IF NEEDED: budeson-glycopyrrolate-formoterol  (BREZTRI  AEROSPHERE) inhaler methocarbamol  (ROBAXIN )  pilocarpine  (SALAGEN )  Polyethyl Glycol-Propyl Glycol (SYSTANE OP) eye drops   Please contact your prescribing provider for instructions regarding if/when to stop taking azaTHIOprine  (IMURAN ) and hydroxychloroquine  (PLAQUENIL ).   One week prior to surgery, STOP taking any Aspirin  (unless otherwise instructed by your surgeon) Aleve, Naproxen, Ibuprofen , Motrin , Advil , Goody's, BC's, all herbal medications, fish oil, and non-prescription vitamins.                     Do NOT Smoke (Tobacco/Vaping) for 24 hours prior to your procedure.  If you use a CPAP at night, you may bring your mask/headgear for your overnight stay.   You will be asked to remove any contacts, glasses, piercing's, hearing aid's, dentures/partials prior to surgery. Please bring cases for these items if needed.    Patients discharged the day of surgery will not be allowed to drive home, and someone needs to stay with them for 24 hours.  SURGICAL WAITING ROOM VISITATION Patients may have no more than 2 support people in the waiting area - these  visitors may rotate.   Pre-op nurse will coordinate an appropriate time for 1 ADULT support person, who may not rotate, to accompany patient in pre-op.  Children under the age of 62 must have an adult with them who is not the patient and must remain in the main waiting area with an adult.  If the patient needs to stay at the hospital during part of their recovery, the visitor guidelines for inpatient rooms apply.  Please refer to the [facility] website for the visitor guidelines for any additional information.   If you received a COVID test during your pre-op visit  it is requested that you wear a mask when out in public, stay away from anyone that may not be feeling well and notify your surgeon if you develop symptoms. If you have been in contact with anyone that has tested positive in the last 10 days please notify you surgeon.      Pre-operative CHG Bathing Instructions   You can play a key role in reducing the risk of infection after surgery. Your skin needs to be as free of germs as possible. You can reduce the number of germs on your skin by washing with CHG (chlorhexidine  gluconate) soap before surgery. CHG is an antiseptic soap that kills germs and continues to kill germs even after washing.   DO NOT use if you have an allergy to chlorhexidine /CHG or antibacterial soaps. If your skin becomes reddened or irritated, stop using the CHG and notify one of our RNs at [phone].              TAKE  A SHOWER THE NIGHT BEFORE SURGERY AND THE DAY OF SURGERY    Please keep in mind the following:  DO NOT shave, including legs and underarms, 48 hours prior to surgery.   You may shave your face before/day of surgery.  Place clean sheets on your bed the night before surgery Use a clean washcloth (not used since being washed) for each shower. DO NOT sleep with pet's night before surgery.  CHG Shower Instructions:  Wash your face and private area with normal soap. If you choose to wash your  hair, wash first with your normal shampoo.  After you use shampoo/soap, rinse your hair and body thoroughly to remove shampoo/soap residue.  Turn the water  OFF and apply half the bottle of CHG soap to a CLEAN washcloth.  Apply CHG soap ONLY FROM YOUR NECK DOWN TO YOUR TOES (washing for 3-5 minutes)  DO NOT use CHG soap on face, private areas, open wounds, or sores.  Pay special attention to the area where your surgery is being performed.  If you are having back surgery, having someone wash your back for you may be helpful. Wait 2 minutes after CHG soap is applied, then you may rinse off the CHG soap.  Pat dry with a clean towel  Put on clean pajamas    Additional instructions for the day of surgery: DO NOT APPLY any lotions, deodorants, cologne, or perfumes.   Do not wear jewelry or makeup Do not wear nail polish, gel polish, artificial nails, or any other type of covering on natural nails (fingers and toes) Do not bring valuables to the hospital. [facility] is not responsible for valuables/personal belongings. Put on clean/comfortable clothes.  Please brush your teeth.  Ask your nurse before applying any prescription medications to the skin.

## 2023-07-21 ENCOUNTER — Other Ambulatory Visit: Payer: Self-pay | Admitting: *Deleted

## 2023-07-21 ENCOUNTER — Encounter (HOSPITAL_COMMUNITY)
Admission: RE | Admit: 2023-07-21 | Discharge: 2023-07-21 | Disposition: A | Discharge status: Home / Self Care | Source: Ambulatory Visit | Attending: Surgery | Admitting: Surgery

## 2023-07-21 ENCOUNTER — Encounter (HOSPITAL_COMMUNITY): Payer: Self-pay

## 2023-07-21 ENCOUNTER — Other Ambulatory Visit: Payer: Self-pay

## 2023-07-21 ENCOUNTER — Telehealth: Payer: Self-pay | Admitting: Rheumatology

## 2023-07-21 VITALS — BP 110/71 | HR 79 | Temp 98.3°F | Resp 17 | Ht 68.0 in | Wt 223.7 lb

## 2023-07-21 DIAGNOSIS — Z01812 Encounter for preprocedural laboratory examination: Secondary | ICD-10-CM | POA: Insufficient documentation

## 2023-07-21 DIAGNOSIS — Z79899 Other long term (current) drug therapy: Secondary | ICD-10-CM

## 2023-07-21 DIAGNOSIS — N289 Disorder of kidney and ureter, unspecified: Secondary | ICD-10-CM | POA: Insufficient documentation

## 2023-07-21 DIAGNOSIS — M3320 Polymyositis, organ involvement unspecified: Secondary | ICD-10-CM

## 2023-07-21 HISTORY — DX: Personal history of other diseases of the digestive system: Z87.19

## 2023-07-21 HISTORY — DX: Unspecified osteoarthritis, unspecified site: M19.90

## 2023-07-21 HISTORY — DX: Pneumonia, unspecified organism: J18.9

## 2023-07-21 LAB — CBC
HCT: 39.2 % (ref 36.0–46.0)
Hemoglobin: 12.5 g/dL (ref 12.0–15.0)
MCH: 28.6 pg (ref 26.0–34.0)
MCHC: 31.9 g/dL (ref 30.0–36.0)
MCV: 89.7 fL (ref 80.0–100.0)
Platelets: 214 10*3/uL (ref 150–400)
RBC: 4.37 MIL/uL (ref 3.87–5.11)
RDW: 13.2 % (ref 11.5–15.5)
WBC: 6.3 10*3/uL (ref 4.0–10.5)
nRBC: 0 % (ref 0.0–0.2)

## 2023-07-21 LAB — BASIC METABOLIC PANEL WITH GFR
Anion gap: 8 (ref 5–15)
BUN: 13 mg/dL (ref 6–20)
CO2: 26 mmol/L (ref 22–32)
Calcium: 9.1 mg/dL (ref 8.9–10.3)
Chloride: 104 mmol/L (ref 98–111)
Creatinine, Ser: 1.43 mg/dL — ABNORMAL HIGH (ref 0.44–1.00)
GFR, Estimated: 44 mL/min — ABNORMAL LOW (ref >60)
Glucose, Bld: 97 mg/dL (ref 70–99)
Potassium: 3.8 mmol/L (ref 3.5–5.1)
Sodium: 138 mmol/L (ref 135–145)

## 2023-07-21 NOTE — Progress Notes (Signed)
 PCP - Dr. Ree Candy at [facility] Cardiologist - Pt saw Dr. Nicholette Barley in 2015 and Dr. Dorothye Gathers in 2020 for CP. All work-up normal and PRN follow-up Pulmonologist - Dr. Orbie Binder Rheumatologist - Dr. Nicholas Bari  PPM/ICD - Denies Device Orders - n/a Rep Notified - n/a  Chest x-ray - n/a EKG - Denies any recent EKGs Stress Test - 04/14/2013 ECHO - 05/09/2013 Cardiac Cath - Denies  Sleep Study - Denies CPAP - n/a  No DM  Last dose of GLP1 agonist- n/a GLP1 instructions: n/a  Blood Thinner Instructions: n/a Aspirin  Instructions: n/a  NPO after midnight  COVID TEST- n/a   Anesthesia review: Yes. Hx of CKD3, Polymyositis.  Patient denies shortness of breath, fever, cough and chest pain at PAT appointment. Pt denies any respiratory illness/infection in the last two months.    All instructions explained to the patient, with a verbal understanding of the material. Patient agrees to go over the instructions while at home for a better understanding. Patient also instructed to self quarantine after being tested for COVID-19. The opportunity to ask questions was provided.

## 2023-07-21 NOTE — Telephone Encounter (Signed)
 Spoke with patient and advised patient that her prescription was sent to the pharmacy on 06/24/2023 for a 90 day supply. Patient will reach out to the pharmacy. Patient advised she does not have to hold her PLQ for her surgery. Patient advised she should hold her Imuran  1 week prior to the surgery and for 2 weeks after. Patient advised she will need clearance from surgeon that there is no infection to resume medication.

## 2023-07-21 NOTE — Telephone Encounter (Signed)
 Patient contacted the office to request a medication refill.   1. Name of Medication: Imuran   2. How are you currently taking this medication (dosage and times per day)? 3 tablets in the am   3. What pharmacy would you like for that to be sent to? [facility]- [address]  *Pt is having a mass surgery on Monday. Pt would like to know if/ when to stop taking the plaquenil  & the Imuran *

## 2023-07-22 ENCOUNTER — Ambulatory Visit: Payer: Self-pay | Admitting: Physician Assistant

## 2023-07-22 LAB — CK: Total CK: 308 U/L — ABNORMAL HIGH (ref 21–240)

## 2023-07-22 LAB — HEPATIC FUNCTION PANEL
AG Ratio: 1.4 (calc) (ref 1.0–2.5)
ALT: 13 U/L (ref 6–29)
AST: 20 U/L (ref 10–35)
Albumin: 3.9 g/dL (ref 3.6–5.1)
Alkaline phosphatase (APISO): 59 U/L (ref 37–153)
Bilirubin, Direct: 0.1 mg/dL (ref 0.0–0.2)
Globulin: 2.8 g/dL (ref 1.9–3.7)
Indirect Bilirubin: 0.6 mg/dL (ref 0.2–1.2)
Total Bilirubin: 0.7 mg/dL (ref 0.2–1.2)
Total Protein: 6.7 g/dL (ref 6.1–8.1)

## 2023-07-22 NOTE — Progress Notes (Signed)
 Hepatic function panel WNL CK is borderline elevated-308--reviewed with Dr. Alvira Josephs. Please clarify if she is experiencing any symptoms of flare?  Recommend avoiding any strenuous exercise/weight lifting and recheck CK in 2 weeks.

## 2023-07-23 NOTE — Progress Notes (Signed)
 Please schedule screening office visit with Dr. Alvira Josephs for further evaluation if she is having new symptoms of a flare.

## 2023-07-24 NOTE — Progress Notes (Unsigned)
 Office Visit Note  Patient: Connie Brady             Date of Birth: 1970-09-10           MRN: [id number]             PCP: Elester Grim, MD Referring: No ref. provider found Visit Date: 07/29/2023 Occupation: @GUAROCC @  Subjective:  Right hip pain  History of Present Illness: Connie Brady is a 53 y.o. 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  She returns today after her last visit in February 2025.  She states she had a lipoma removed from her back last Monday.  She is gradually recovering from it.  She states recently she has been having increased pain and discomfort in her right hip which she describes over the trochanteric region.  She states this radiates down into her leg.  Had x-rays of her right hip by her PCP recently which showed only mild osteoarthritis.  She experiences some discomfort in her lower extremities.  None of the other joints or muscles are painful.  She has been working out on a regular basis.  Her eyes are still dry.  She states the pilocarpine  helps with her dry mouth.    Activities of Daily Living:  Patient reports morning stiffness for 24 hours.   Patient Reports nocturnal pain.  Difficulty dressing/grooming: Denies Difficulty climbing stairs: Denies Difficulty getting out of chair: Reports Difficulty using hands for taps, buttons, cutlery, and/or writing: Denies  Review of Systems  Constitutional:  Positive for fatigue.  HENT:  Negative for mouth sores and mouth dryness.   Eyes:  Positive for dryness.  Respiratory:  Negative for shortness of breath.   Cardiovascular:  Negative for chest pain and palpitations.  Gastrointestinal:  Negative for blood in stool, constipation and diarrhea.  Endocrine: Negative for increased urination.  Genitourinary:  Negative for involuntary urination.  Musculoskeletal:  Positive for joint pain, joint pain, myalgias, morning stiffness, muscle tenderness and myalgias. Negative for gait problem, joint swelling  and muscle weakness.  Skin:  Positive for sensitivity to sunlight. Negative for color change, rash and hair loss.  Allergic/Immunologic: Negative for susceptible to infections.  Neurological:  Positive for headaches. Negative for dizziness.  Hematological:  Negative for swollen glands.  Psychiatric/Behavioral:  Positive for depressed mood and sleep disturbance. The patient is nervous/anxious.     PMFS History:  Patient Active Problem List   Diagnosis Date Noted   Sjogren syndrome, unspecified (HCC) 05/22/2021   Pain of left lower leg 11/15/2020   Osteoarthritis of left knee 04/07/2019   Left lateral knee pain 08/24/2017   S/P arthroscopic surgery of left knee 08/24/2017   Abnormal cervical Papanicolaou smear 08/31/2016   Obesity 08/31/2016   Premenstrual symptom 08/31/2016   Vitamin D  deficiency 08/25/2016   History of chronic kidney disease 05/07/2016   Chronic kidney disease (CKD), stage III (moderate) (HCC) 03/30/2016   High risk medication use 01/10/2016   Primary osteoarthritis of both hips 01/10/2016   Primary osteoarthritis of both knees 01/10/2016   Primary osteoarthritis of both feet 01/10/2016   Bilateral plantar fasciitis 01/10/2016   Chest pain 01/26/2013   Polymyositis (HCC) 01/26/2013   Sjogren's syndrome (HCC)     Past Medical History:  Diagnosis Date   Anemia    Arthritis    Chronic kidney disease    stage 3   GERD (gastroesophageal reflux disease)    History of hiatal hernia    Iron deficiency  per patient   Low vitamin B12 level    per patient   Pneumonia    Polymyositis (HCC)    also neuropathy of feet   Sjoegren syndrome     Family History  Problem Relation Age of Onset   Hypertension Sister    Hypertension Sister    Hypertension Sister    Hypertension Brother    Diabetes Brother    Hypertension Brother    Diabetes Brother    Past Surgical History:  Procedure Laterality Date   BUNIONECTOMY Bilateral    EXCISION MASS, BACK N/A 07/26/2023    Procedure: EXCISION MASS, BACK;  Surgeon: Anda Bamberg, MD;  Location: [facility];  Service: General;  Laterality: N/A;  EXCISION OF SOFT TISSUE MASS ON BACK   KNEE ARTHROSCOPY WITH LATERAL MENISECTOMY Left 08/24/2017   Procedure: LEFT KNEE ARTHROSCOPY WITH LATERAL MENISECTOMY, CHONDROPLASTY, ARTHROSCOPIC ASSISTED INTERNAL FIXATION LATERAL TIBIAL PLATEAU;  Surgeon: Genevie Kerns, MD;  Location: [facility];  Service: Orthopedics;  Laterality: Left;   MUSCLE BIOPSY     r/thigh   PLANTAR FASCIA SURGERY Right    SHOULDER ARTHROSCOPY Left    TOTAL KNEE ARTHROPLASTY Left 04/07/2019   Procedure: TOTAL KNEE ARTHROPLASTY;  Surgeon: Genevie Kerns, MD;  Location: [facility];  Service: Orthopedics;  Laterality: Left;  adductor canal   Social History   Social History Narrative   Not on file   Immunization History  Administered Date(s) Administered   PFIZER(Purple Top)SARS-COV-2 Vaccination 08/17/2019, 09/07/2019, 06/30/2020     Objective: Vital Signs: BP 109/73 (BP Location: Left Arm, Patient Position: Sitting, Cuff Size: Large)   Pulse 72   Resp 14   Ht 5' 8 (1.727 m)   Wt 221 lb (100.2 kg)   LMP  (LMP Unknown)   BMI 33.60 kg/m    Physical Exam Vitals and nursing note reviewed.  Constitutional:      Appearance: She is well-developed.  HENT:     Head: Normocephalic and atraumatic.   Eyes:     Conjunctiva/sclera: Conjunctivae normal.    Cardiovascular:     Rate and Rhythm: Normal rate and regular rhythm.     Heart sounds: Normal heart sounds.  Pulmonary:     Effort: Pulmonary effort is normal.     Breath sounds: Normal breath sounds.  Abdominal:     General: Bowel sounds are normal.     Palpations: Abdomen is soft.   Musculoskeletal:     Cervical back: Normal range of motion.  Lymphadenopathy:     Cervical: No cervical adenopathy.   Skin:    General: Skin is warm and dry.     Capillary Refill: Capillary refill takes less than 2 seconds.   Neurological:      Mental Status: She is alert and oriented to person, place, and time.   Psychiatric:        Behavior: Behavior normal.      Musculoskeletal Exam: She good range of motion of the cervical spine.  There is no tenderness over thoracic or lumbar spine.  She has a surgical scar on the left lower side of the back.  Shoulders, elbows, wrist, MCPs PIPs and DIPs with good range of motion with no synovitis.  Hip joints have good range of motion.  She tenderness over right trochanteric bursa.  Left knee joint is replaced which had no warmth swelling or effusion.  Right knee joint with good range of motion without Raynauds or effusion.  There was no tenderness over ankles or  MTPs.  No significant increased muscular weakness or tenderness was noted.  She was able to get up from the squatting position.  CDAI Exam: CDAI Score: -- Patient Global: --; Provider Global: -- Swollen: --; Tender: -- Joint Exam 07/29/2023   No joint exam has been documented for this visit   There is currently no information documented on the homunculus. Go to the Rheumatology activity and complete the homunculus joint exam.  Investigation: No additional findings.  Imaging: No results found.  Recent Labs: Lab Results  Component Value Date   WBC 6.3 07/21/2023   HGB 12.5 07/21/2023   PLT 214 07/21/2023   NA 138 07/21/2023   K 3.8 07/21/2023   CL 104 07/21/2023   CO2 26 07/21/2023   GLUCOSE 97 07/21/2023   BUN 13 07/21/2023   CREATININE 1.43 (H) 07/21/2023   BILITOT 0.7 07/21/2023   ALKPHOS 57 04/04/2019   AST 20 07/21/2023   ALT 13 07/21/2023   PROT 6.7 07/21/2023   ALBUMIN 3.7 04/04/2019   CALCIUM 9.1 07/21/2023   GFRAA 50 (L) 08/02/2020   July 21, 2023 CBC normal, CMP creatinine elevated at 1.43, CK308. Speciality Comments: PLQ Eye Exam: 08/03/2022 WNL @ [facility].    Fosamax  to start date December 17, 2019  Procedures:  No procedures performed Allergies: Lyrica [pregabalin] and Percocet  [oxycodone -acetaminophen ]   Assessment / Plan:     Visit Diagnoses: Polymyositis (HCC) elevated CK, positive muscle biopsy, myositis panel negative, initial CK 522: Patient denies any increased muscular weakness or tenderness.  She states she has been working out more than usual.  She recently had a surgery for soft tissue mass removal from he left lower back.  She is holding Imuran  currently as she had recent surgery.  Recent CK was elevated at 308.  Patient states she has been working out more than usual.  She had recent surgery.  I advised her to get repeat labs prior to her next visit which is in a month.  Sjogren's syndrome with keratoconjunctivitis sicca (HCC)-she continues to have dry mouth and dry eye symptoms.  She has positive SSA and positive ANA.  She states her symptoms are better on pilocarpine .  High risk medication use-she is on Imuran  50 mg 3 tablets by mouth daily, Plaquenil  200 mg p.o. daily and prednisone  6 mg daily.  She is holding Imuran  currently due to recent surgery.  She continues to take Plaquenil  and prednisone .  Plaquenil  eye examination was on August 03, 2022.  She is supposed to get repeat eye examination.  Raynaud's phenomenon without gangrene currently not symptomatic.  On prednisone  therapy-hemoglobin A1c was 5.6 on April 13, 2023.  Primary osteoarthritis of both hands-currently not symptomatic.  Primary osteoarthritis of both hips-recent x-rays were reviewed which showed  mild osteoarthritis.  Trochanteric bursitis of both hips-she continues to have recurrent trochanteric bursitis.  She does abductors at the gym.  Advised her to reduce the weight on the abductor machine.  She was having pain and discomfort in the right trochanteric bursa and nocturnal pain.  After informed consent was obtained and side effects were discussed right trochanteric bursa was injected with lidocaine  and Kenalog  as described above.  She tolerated the procedure well.  Postprocedure  instructions were given.  S/P total knee arthroplasty, left-doing well without any warmth swelling or effusion.  Primary osteoarthritis of right knee-currently not symptomatic.  Primary osteoarthritis of both feet-proper fitting shoes were advised.  Patient requested a note for her work to wear tennis  shoes.  Her feet hurt if she does not wear tennis shoes.  Neck pain-she continues to have some stiffness.  Degeneration of intervertebral disc of lumbar region without discogenic back pain or lower extremity pain-chronic discomfort.  Osteopenia of multiple sites-calcium diet and vitamin D  was advised.  History of vitamin D  deficiency  Stage 3a chronic kidney disease (HCC)  Diabetes mellitus screening  Orders: Orders Placed This Encounter  Procedures   CK   Aldolase   3-Hydroxy-3-Methylglutaryl-Coenzyme A Reductase (HMGCR) AB (IgG)   Myositis Specific II Antibodies Panel   Protein / creatinine ratio, urine   Anti-DNA antibody, double-stranded   C3 and C4   Sedimentation rate   ANA   Sjogrens syndrome-A extractable nuclear antibody   Serum protein electrophoresis with reflex   No orders of the defined types were placed in this encounter.    Follow-Up Instructions: Return for Polymyositis.   Nicholas Bari, MD  Note - This record has been created using Animal nutritionist.  Chart creation errors have been sought, but may not always  have been located. Such creation errors do not reflect on  the standard of medical care.

## 2023-07-26 ENCOUNTER — Ambulatory Visit (HOSPITAL_COMMUNITY)
Admission: RE | Admit: 2023-07-26 | Discharge: 2023-07-26 | Disposition: A | Discharge status: Home / Self Care | Attending: Surgery | Admitting: Surgery

## 2023-07-26 ENCOUNTER — Ambulatory Visit (HOSPITAL_COMMUNITY): Payer: Self-pay | Admitting: Physician Assistant

## 2023-07-26 ENCOUNTER — Encounter (HOSPITAL_COMMUNITY)
Admission: RE | Disposition: A | Discharge status: Home / Self Care | Payer: Self-pay | Source: Home / Self Care | Attending: Surgery

## 2023-07-26 ENCOUNTER — Encounter (HOSPITAL_COMMUNITY): Payer: Self-pay | Admitting: Surgery

## 2023-07-26 ENCOUNTER — Other Ambulatory Visit: Payer: Self-pay

## 2023-07-26 ENCOUNTER — Ambulatory Visit (HOSPITAL_COMMUNITY): Admitting: Anesthesiology

## 2023-07-26 DIAGNOSIS — M7989 Other specified soft tissue disorders: Secondary | ICD-10-CM | POA: Diagnosis not present

## 2023-07-26 DIAGNOSIS — N183 Chronic kidney disease, stage 3 unspecified: Secondary | ICD-10-CM | POA: Insufficient documentation

## 2023-07-26 DIAGNOSIS — Z7952 Long term (current) use of systemic steroids: Secondary | ICD-10-CM | POA: Insufficient documentation

## 2023-07-26 DIAGNOSIS — D171 Benign lipomatous neoplasm of skin and subcutaneous tissue of trunk: Secondary | ICD-10-CM | POA: Diagnosis present

## 2023-07-26 DIAGNOSIS — Z7722 Contact with and (suspected) exposure to environmental tobacco smoke (acute) (chronic): Secondary | ICD-10-CM | POA: Diagnosis not present

## 2023-07-26 HISTORY — PX: EXCISION MASS, BACK: SHX7560

## 2023-07-26 SURGERY — EXCISION MASS, BACK
Anesthesia: General | Site: Back

## 2023-07-26 MED ORDER — BUPIVACAINE HCL (PF) 0.25 % IJ SOLN
INTRAMUSCULAR | Status: AC
Start: 2023-07-26 — End: ?
  Filled 2023-07-26: qty 30

## 2023-07-26 MED ORDER — ORAL CARE MOUTH RINSE: 15.0000 mL | Freq: Once | OROMUCOSAL | Status: AC

## 2023-07-26 MED ORDER — MIDAZOLAM HCL 2 MG/2ML IJ SOLN
INTRAMUSCULAR | Status: AC
  Filled 2023-07-26: qty 2

## 2023-07-26 MED ORDER — METHOCARBAMOL 750 MG PO TABS: 750.0000 mg | ORAL_TABLET | Freq: Four times a day (QID) | ORAL | 1 refills | Status: AC

## 2023-07-26 MED ORDER — ONDANSETRON HCL 4 MG/2ML IJ SOLN
INTRAMUSCULAR | Status: DC | PRN
  Administered 2023-07-26: 4 mg via INTRAVENOUS

## 2023-07-26 MED ORDER — FENTANYL CITRATE (PF) 250 MCG/5ML IJ SOLN
INTRAMUSCULAR | Status: DC | PRN
  Administered 2023-07-26: 50 ug via INTRAVENOUS
  Administered 2023-07-26: 100 ug via INTRAVENOUS

## 2023-07-26 MED ORDER — ACETAMINOPHEN 10 MG/ML IV SOLN
INTRAVENOUS | Status: DC | PRN
  Administered 2023-07-26: 1000 mg via INTRAVENOUS

## 2023-07-26 MED ORDER — CEFAZOLIN SODIUM-DEXTROSE 2-4 GM/100ML-% IV SOLN
2.0000 g | INTRAVENOUS | Status: AC
  Administered 2023-07-26: 2 g via INTRAVENOUS

## 2023-07-26 MED ORDER — 0.9 % SODIUM CHLORIDE (POUR BTL) OPTIME
TOPICAL | Status: DC | PRN
  Administered 2023-07-26: 1000 mL

## 2023-07-26 MED ORDER — SUGAMMADEX SODIUM 200 MG/2ML IV SOLN
INTRAVENOUS | Status: DC | PRN
  Administered 2023-07-26: 200 mg via INTRAVENOUS

## 2023-07-26 MED ORDER — PROPOFOL 10 MG/ML IV BOLUS
INTRAVENOUS | Status: DC | PRN
  Administered 2023-07-26: 120 mg via INTRAVENOUS

## 2023-07-26 MED ORDER — DEXMEDETOMIDINE HCL IN NACL 80 MCG/20ML IV SOLN
INTRAVENOUS | Status: DC | PRN
  Administered 2023-07-26 (×2): 4 ug via INTRAVENOUS

## 2023-07-26 MED ORDER — OXYCODONE HCL 5 MG PO TABS: 5.0000 mg | ORAL_TABLET | ORAL | 0 refills | Status: AC | PRN

## 2023-07-26 MED ORDER — CHLORHEXIDINE GLUCONATE 0.12 % MT SOLN
15.0000 mL | Freq: Once | OROMUCOSAL | Status: AC
  Administered 2023-07-26: 15 mL via OROMUCOSAL

## 2023-07-26 MED ORDER — ENOXAPARIN SODIUM 40 MG/0.4ML IJ SOSY
PREFILLED_SYRINGE | INTRAMUSCULAR | Status: DC
Start: 2023-07-26 — End: 2023-07-26
  Filled 2023-07-26: qty 0.4

## 2023-07-26 MED ORDER — FENTANYL CITRATE (PF) 250 MCG/5ML IJ SOLN
INTRAMUSCULAR | Status: AC
Start: 2023-07-26 — End: ?
  Filled 2023-07-26: qty 5

## 2023-07-26 MED ORDER — ROCURONIUM BROMIDE 10 MG/ML (PF) SYRINGE
PREFILLED_SYRINGE | INTRAVENOUS | Status: DC | PRN
  Administered 2023-07-26: 60 mg via INTRAVENOUS

## 2023-07-26 MED ORDER — CHLORHEXIDINE GLUCONATE CLOTH 2 % EX PADS: 6.0000 | MEDICATED_PAD | Freq: Once | CUTANEOUS | Status: DC

## 2023-07-26 MED ORDER — DOCUSATE SODIUM 100 MG PO CAPS: 100.0000 mg | ORAL_CAPSULE | Freq: Two times a day (BID) | ORAL | 2 refills | Status: AC

## 2023-07-26 MED ORDER — BUPIVACAINE LIPOSOME 1.3 % IJ SUSP
INTRAMUSCULAR | Status: DC | PRN
  Administered 2023-07-26: 40 mL

## 2023-07-26 MED ORDER — LIDOCAINE 2% (20 MG/ML) 5 ML SYRINGE
INTRAMUSCULAR | Status: DC | PRN
  Administered 2023-07-26: 100 mg via INTRAVENOUS

## 2023-07-26 MED ORDER — DEXAMETHASONE SODIUM PHOSPHATE 10 MG/ML IJ SOLN
INTRAMUSCULAR | Status: DC | PRN
  Administered 2023-07-26: 10 mg via INTRAVENOUS

## 2023-07-26 MED ORDER — FENTANYL CITRATE (PF) 100 MCG/2ML IJ SOLN: 25.0000 ug | INTRAMUSCULAR | Status: DC | PRN

## 2023-07-26 MED ORDER — ENOXAPARIN SODIUM 40 MG/0.4ML IJ SOSY
40.0000 mg | PREFILLED_SYRINGE | Freq: Once | INTRAMUSCULAR | Status: AC
  Administered 2023-07-26: 40 mg via SUBCUTANEOUS

## 2023-07-26 MED ORDER — ONDANSETRON HCL 4 MG/2ML IJ SOLN: 4.0000 mg | Freq: Once | INTRAMUSCULAR | Status: DC | PRN

## 2023-07-26 MED ORDER — LACTATED RINGERS IV SOLN: INTRAVENOUS | Status: DC

## 2023-07-26 MED ORDER — BUPIVACAINE LIPOSOME 1.3 % IJ SUSP: 20.0000 mL | Freq: Once | INTRAMUSCULAR | Status: DC

## 2023-07-26 MED ORDER — BUPIVACAINE LIPOSOME 1.3 % IJ SUSP
INTRAMUSCULAR | Status: AC
  Filled 2023-07-26: qty 20

## 2023-07-26 MED ORDER — MIDAZOLAM HCL 2 MG/2ML IJ SOLN
INTRAMUSCULAR | Status: DC | PRN
  Administered 2023-07-26: 2 mg via INTRAVENOUS

## 2023-07-26 MED ORDER — CHLORHEXIDINE GLUCONATE 0.12 % MT SOLN
OROMUCOSAL | Status: AC
  Filled 2023-07-26: qty 15

## 2023-07-26 MED ORDER — CEFAZOLIN SODIUM-DEXTROSE 2-4 GM/100ML-% IV SOLN
INTRAVENOUS | Status: AC
  Filled 2023-07-26: qty 100

## 2023-07-26 MED ORDER — ACETAMINOPHEN 500 MG PO TABS: 1000.0000 mg | ORAL_TABLET | Freq: Four times a day (QID) | ORAL | 3 refills | Status: AC

## 2023-07-26 SURGICAL SUPPLY — 32 items
CANISTER SUCTION 3000ML PPV (SUCTIONS) ×1 IMPLANT
CHLORAPREP W/TINT 26 (MISCELLANEOUS) ×1 IMPLANT
COVER SURGICAL LIGHT HANDLE (MISCELLANEOUS) ×1 IMPLANT
DERMABOND ADVANCED .7 DNX12 (GAUZE/BANDAGES/DRESSINGS) ×1 IMPLANT
DRAPE LAPAROSCOPIC ABDOMINAL (DRAPES) IMPLANT
DRAPE LAPAROTOMY 100X72 PEDS (DRAPES) IMPLANT
DRSG TEGADERM 4X4.75 (GAUZE/BANDAGES/DRESSINGS) IMPLANT
DRSG TELFA 3X8 NADH STRL (GAUZE/BANDAGES/DRESSINGS) IMPLANT
ELECTRODE REM PT RTRN 9FT ADLT (ELECTROSURGICAL) ×1 IMPLANT
GAUZE 4X4 16PLY ~~LOC~~+RFID DBL (SPONGE) IMPLANT
GAUZE SPONGE 4X4 12PLY STRL (GAUZE/BANDAGES/DRESSINGS) IMPLANT
GLOVE BIO SURGEON STRL SZ 6.5 (GLOVE) ×1 IMPLANT
GLOVE BIOGEL PI IND STRL 6 (GLOVE) ×1 IMPLANT
GOWN STRL REUS W/ TWL LRG LVL3 (GOWN DISPOSABLE) ×1 IMPLANT
KIT BASIN OR (CUSTOM PROCEDURE TRAY) ×1 IMPLANT
KIT TURNOVER KIT B (KITS) ×1 IMPLANT
MARKER SKIN DUAL TIP RULER LAB (MISCELLANEOUS) ×1 IMPLANT
NDL HYPO 25GX1X1/2 BEV (NEEDLE) ×1 IMPLANT
NEEDLE HYPO 25GX1X1/2 BEV (NEEDLE) ×1 IMPLANT
NS IRRIG 1000ML POUR BTL (IV SOLUTION) ×1 IMPLANT
PACK GENERAL/GYN (CUSTOM PROCEDURE TRAY) ×1 IMPLANT
PAD ARMBOARD POSITIONER FOAM (MISCELLANEOUS) ×2 IMPLANT
SPECIMEN JAR SMALL (MISCELLANEOUS) ×1 IMPLANT
STRIP CLOSURE SKIN 1/2X4 (GAUZE/BANDAGES/DRESSINGS) IMPLANT
SUT ETHILON 2 0 FS 18 (SUTURE) IMPLANT
SUT MNCRL AB 4-0 PS2 18 (SUTURE) ×1 IMPLANT
SUT SILK 2 0 PERMA HAND 18 BK (SUTURE) IMPLANT
SUT VIC AB 3-0 SH 18 (SUTURE) IMPLANT
SUT VIC AB 3-0 SH 27X BRD (SUTURE) ×1 IMPLANT
SYR CONTROL 10ML LL (SYRINGE) ×1 IMPLANT
TOWEL GREEN STERILE (TOWEL DISPOSABLE) ×1 IMPLANT
TOWEL GREEN STERILE FF (TOWEL DISPOSABLE) ×1 IMPLANT

## 2023-07-26 NOTE — Transfer of Care (Signed)
 Immediate Anesthesia Transfer of Care Note  Patient: Connie Brady  Procedure(s) Performed: EXCISION MASS, BACK (Back)  Patient Location: PACU  Anesthesia Type:General  Level of Consciousness: awake, alert , and oriented  Airway & Oxygen Therapy: Patient Spontanous Breathing  Post-op Assessment: Report given to RN  Post vital signs: Reviewed and stable  Last Vitals:  Vitals Value Taken Time  BP 126/76 07/26/23 1547  Temp    Pulse 93 07/26/23 1550  Resp 34 07/26/23 1550  SpO2 97 % 07/26/23 1550  Vitals shown include unfiled device data.  Last Pain:  Vitals:   07/26/23 1224  TempSrc:   PainSc: 7       Patients Stated Pain Goal: 1 (07/26/23 1224)  Complications: No notable events documented.

## 2023-07-26 NOTE — Anesthesia Preprocedure Evaluation (Addendum)
 Anesthesia Evaluation  Patient identified by MRN, date of birth, ID band Patient awake    Reviewed: Allergy & Precautions, NPO status , Patient's Chart, lab work & pertinent test results, reviewed documented beta blocker date and time   History of Anesthesia Complications Negative for: history of anesthetic complications  Airway Mallampati: III  TM Distance: >3 FB     Dental no notable dental hx.    Pulmonary neg shortness of breath, pneumonia, neg PE Sjogren's, polymyositis   breath sounds clear to auscultation       Cardiovascular (-) hypertension(-) angina (-) CAD, (-) Past MI and (-) Cardiac Stents  Rhythm:Regular Rate:Normal     Neuro/Psych neg Seizures  Neuromuscular disease    GI/Hepatic hiatal hernia,GERD  ,,(+) neg Cirrhosis        Endo/Other    Renal/GU CRFRenal disease     Musculoskeletal  (+) Arthritis , Osteoarthritis,    Abdominal   Peds  Hematology  (+) Blood dyscrasia, anemia   Anesthesia Other Findings   Reproductive/Obstetrics                             Anesthesia Physical Anesthesia Plan  ASA: 2  Anesthesia Plan: General   Post-op Pain Management:    Induction: Intravenous  PONV Risk Score and Plan: 2 and Ondansetron  and Dexamethasone   Airway Management Planned: Oral ETT  Additional Equipment:   Intra-op Plan:   Post-operative Plan: Extubation in OR  Informed Consent: I have reviewed the patients History and Physical, chart, labs and discussed the procedure including the risks, benefits and alternatives for the proposed anesthesia with the patient or authorized representative who has indicated his/her understanding and acceptance.     Dental advisory given  Plan Discussed with: CRNA  Anesthesia Plan Comments:        Anesthesia Quick Evaluation

## 2023-07-26 NOTE — H&P (Addendum)
 Connie Brady is an 53 y.o. female.   HPI: 2F with soft tissue mass of back. Plan excision today. The patient has had no hospitalizations, doctors visits, ER visits, surgeries, or newly diagnosed allergies since being seen in the office. Having R hip pain, scheduled to see Rheum 6/12. Stopped taking imuran , but has continued prednisone  at 6mg  daily.    Past Medical History:  Diagnosis Date   Anemia    Arthritis    Chronic kidney disease    stage 3   GERD (gastroesophageal reflux disease)    History of hiatal hernia    Iron deficiency    per patient   Low vitamin B12 level    per patient   Pneumonia    Polymyositis (HCC)    also neuropathy of feet   Sjoegren syndrome     Past Surgical History:  Procedure Laterality Date   BUNIONECTOMY Bilateral    KNEE ARTHROSCOPY WITH LATERAL MENISECTOMY Left 08/24/2017   Procedure: LEFT KNEE ARTHROSCOPY WITH LATERAL MENISECTOMY, CHONDROPLASTY, ARTHROSCOPIC ASSISTED INTERNAL FIXATION LATERAL TIBIAL PLATEAU;  Surgeon: Genevie Kerns, MD;  Location: [facility];  Service: Orthopedics;  Laterality: Left;   MUSCLE BIOPSY     r/thigh   PLANTAR FASCIA SURGERY Right    SHOULDER ARTHROSCOPY Left    TOTAL KNEE ARTHROPLASTY Left 04/07/2019   Procedure: TOTAL KNEE ARTHROPLASTY;  Surgeon: Genevie Kerns, MD;  Location: [facility];  Service: Orthopedics;  Laterality: Left;  adductor canal    Family History  Problem Relation Age of Onset   Hypertension Sister    Hypertension Sister    Hypertension Sister    Hypertension Brother    Diabetes Brother    Hypertension Brother    Diabetes Brother     Social History:  reports that she has never smoked. She has been exposed to tobacco smoke. She has never used smokeless tobacco. She reports that she does not drink alcohol and does not use drugs.  Allergies:  Allergies  Allergen Reactions   Lyrica [Pregabalin] Itching   Percocet [Oxycodone -Acetaminophen ] Rash and Other (See  Comments)    Hallucinations     Medications: I have reviewed the patient's current medications.  No results found for this or any previous visit (from the past 48 hours).  No results found.  ROS 10 point review of systems is negative except as listed above in HPI.   Physical Exam Blood pressure 127/75, pulse 78, temperature 98.2 F (36.8 C), temperature source Oral, resp. rate 18, height 5\' 8"  (1.727 m), weight 101.6 kg, SpO2 98%. Constitutional: well-developed, well-nourished HEENT: pupils equal, round, reactive to light, 2mm b/l, moist conjunctiva, external inspection of ears and nose normal, hearing intact Oropharynx: normal oropharyngeal mucosa, normal dentition Neck: no thyromegaly, trachea midline, no midline cervical tenderness to palpation Chest: breath sounds equal bilaterally, normal respiratory effort, no midline or lateral chest wall tenderness to palpation/deformity Abdomen: soft, NT, no bruising, no hepatosplenomegaly GU: normal female genitalia  Back: no wounds, no thoracic/lumbar spine tenderness to palpation, no thoracic/lumbar spine stepoffs, soft tissue mass on L mid back 5x5cm Skin: warm, dry, no rashes Psych: normal memory, normal mood/affect     Assessment/Plan: Soft tissue mass of back - plan excision today, informed consent was obtained after detailed explanation of risks, including bleeding, infection, hematoma/seroma, temporary or permanent neuropathy, recurrence. All questions answered to the patient's satisfaction. FEN - strict NPO DVT - SCDs,   Dispo - home post-op    Anda Bamberg, MD  General and Trauma Surgery [facility] Surgery

## 2023-07-26 NOTE — Anesthesia Procedure Notes (Signed)
 Procedure Name: Intubation Date/Time: 07/26/2023 2:47 PM  Performed by: Hershall Lory, CRNAPre-anesthesia Checklist: Patient identified, Emergency Drugs available, Suction available and Patient being monitored Patient Re-evaluated:Patient Re-evaluated prior to induction Oxygen Delivery Method: Circle System Utilized Preoxygenation: Pre-oxygenation with 100% oxygen Induction Type: IV induction Ventilation: Mask ventilation without difficulty Laryngoscope Size: Miller Grade View: Grade II Tube type: Oral Tube size: 7.0 mm Number of attempts: 1 Airway Equipment and Method: Stylet and Oral airway Placement Confirmation: ETT inserted through vocal cords under direct vision, positive ETCO2 and breath sounds checked- equal and bilateral Secured at: 21 cm Tube secured with: Tape Dental Injury: Teeth and Oropharynx as per pre-operative assessment

## 2023-07-26 NOTE — Op Note (Signed)
   Operative Note   Date: 07/26/2023  Procedure: excision of soft tissue mass of back  Pre-op diagnosis: soft tissue mass of back, ~5x5cm Post-op diagnosis: same  Indication and clinical history: The patient is a 53 y.o. year old female with soft tissue mass of back     Surgeon: Anda Bamberg, MD  Anesthesiologist: [place] Anesthesia: General  Findings:  Specimen: soft tissue mass, suspect lipoma EBL: <5cc Drains/Implants: none  Disposition: PACU - hemodynamically stable.  Description of procedure: The patient was positioned supine on the operating room table. General anesthetic induction and intubation were uneventful. The patient was repositioned to prone. Time-out was performed verifying correct patient, procedure, signature of informed consent, and administration of pre-operative antibiotics. The back was prepped and draped in the usual sterile fashion.  A transverse incision was made and deepened until the mass was reached. The mass was circumferentially separated from the surrounding tissues using blunt dissection. The wound was irrigated and hemostasis confirmed. Local anesthetic was infiltrated. The wound was closed in layers using 3-0 vicryl suture for two deep layers, 4-0 monocryl subcuticular, and 2-0 nylon mattress.   Sterile dressings were applied. All sponge and instrument counts were correct at the conclusion of the procedure. The patient was awakened from anesthesia, extubated uneventfully, and transported to the PACU in good condition. There were no complications.     Anda Bamberg, MD General and Trauma Surgery [facility] Surgery

## 2023-07-26 NOTE — Discharge Instructions (Signed)
 [facility]  LAPAROSCOPIC SURGERY: POST OP INSTRUCTIONS Always review your discharge instruction sheet given to you by the facility where your surgery was performed. IF YOU HAVE DISABILITY OR FAMILY LEAVE FORMS, YOU MUST BRING THEM TO THE OFFICE FOR PROCESSING.   DO NOT GIVE THEM TO YOUR DOCTOR.  PAIN CONTROL  Pain regimen: take over-the-counter tylenol  (acetaminophen ) 1000mg  every six hours and the robaxin  (methocarbamol ) 750mg  every six hours. With both of these, you should be taking something every three hours. Example: tylenol  ( acetaminophen ) at 9am, robaxin  (methocarbamol ) at 12pm, tylenol  (acetaminophen ) again at 3pm, robaxin  (methocarbamol ) at 6pm. You also have a prescription for oxycodone , which should be taken if the tylenol  (acetaminophen ) and robaxin  (methocarbamol ) are not enough to control your pain. You may take the oxycodone  as frequently as every four hours as needed, but if you are taking the other medications as above, you should not need the oxycodone  this frequently. You have also been given a prescription for colace (docusate) which is a stool softener. Please take this as prescribed because the oxycodone  can cause constipation and the colace (docusate) will minimize or prevent constipation. Do not drive while taking or under the influence of the oxycodone  as it is a narcotic medication. Use ice packs to help control pain. If you need a refill on your pain medication, please contact your pharmacy.  They will contact our office to request authorization. Prescriptions will not be filled after 5pm or on week-ends.  HOME MEDICATIONS Take your usually prescribed medications unless otherwise directed.  DIET You should follow a light diet the first few days after arrival home.  Be sure to include lots of fluids daily.   CONSTIPATION It is common to experience some constipation after surgery and if you are taking pain medication.  Increasing fluid intake and  taking a stool softener (such as Colace) will usually help or prevent this problem from occurring.  A mild laxative (Milk of Magnesia or Miralax) should be taken according to package instructions if there are no bowel movements after 48 hours.  WOUND/INCISION CARE Most patients will experience some swelling and bruising in the area of the incisions.  Ice packs will help.  Swelling and bruising can take several days to resolve.  May shower beginning 07/28/2023.  Do not peel off dressing until 07/28/2023. May allow warm soapy water  to run over incision, then rinse and pat dry.  Okay to cover wound with a dressing, but try to leave open to air as much as possible.  Do not soak in any water  (tubs, hot tubs, pools, lakes, oceans) for one week.   ACTIVITIES You may resume regular (light) daily activities beginning the next day--such as daily self-care, walking, climbing stairs--gradually increasing activities as tolerated.  You may have sexual intercourse when it is comfortable.   No lifting you arms higher than shoulder height for one week.  You may drive when you are no longer taking narcotic pain medication, you can comfortably wear a seatbelt, and you can safely maneuver your car and apply brakes.  FOLLOW-UP You should see your doctor in the office for a follow-up appointment approximately 2-3 weeks after your surgery.  You should have been given your post-op/follow-up appointment when your surgery was scheduled.  If you did not receive a post-op/follow-up appointment, make sure that you call for this appointment within a day or two after you arrive home to insure a convenient appointment time.  WHEN TO CALL YOUR DOCTOR: Fever over  101.5 Inability to urinate Continued bleeding from incision. Increased pain, redness, or drainage from the incision. Increasing abdominal pain  The clinic staff is available to answer your questions during regular business hours.  Please don't hesitate to call and ask  to speak to one of the nurses for clinical concerns.  If you have a medical emergency, go to the nearest emergency room or call 911.  A surgeon from [facility] is always on call at the hospital. [address], [place], [place]  [address] ? [address] [address], [place], [place]   [address] [phone] ? [phone] ? FAX [phone] Web site: [email or web address]

## 2023-07-27 ENCOUNTER — Encounter (HOSPITAL_COMMUNITY): Payer: Self-pay | Admitting: Surgery

## 2023-07-27 NOTE — Anesthesia Postprocedure Evaluation (Signed)
 Anesthesia Post Note  Patient: Connie Brady  Procedure(s) Performed: EXCISION MASS, BACK (Back)     Patient location during evaluation: PACU Anesthesia Type: General Level of consciousness: awake and alert Pain management: pain level controlled Vital Signs Assessment: post-procedure vital signs reviewed and stable Respiratory status: spontaneous breathing, nonlabored ventilation and respiratory function stable Cardiovascular status: blood pressure returned to baseline and stable Postop Assessment: no apparent nausea or vomiting Anesthetic complications: no   No notable events documented.                Dolce Sylvia

## 2023-07-29 ENCOUNTER — Other Ambulatory Visit: Payer: Self-pay | Admitting: *Deleted

## 2023-07-29 ENCOUNTER — Ambulatory Visit: Attending: Rheumatology | Admitting: Rheumatology

## 2023-07-29 ENCOUNTER — Encounter: Payer: Self-pay | Admitting: Rheumatology

## 2023-07-29 VITALS — BP 109/73 | HR 72 | Resp 14 | Ht 68.0 in | Wt 221.0 lb

## 2023-07-29 DIAGNOSIS — N1831 Chronic kidney disease, stage 3a: Secondary | ICD-10-CM

## 2023-07-29 DIAGNOSIS — M1711 Unilateral primary osteoarthritis, right knee: Secondary | ICD-10-CM

## 2023-07-29 DIAGNOSIS — M51369 Other intervertebral disc degeneration, lumbar region without mention of lumbar back pain or lower extremity pain: Secondary | ICD-10-CM

## 2023-07-29 DIAGNOSIS — M3501 Sicca syndrome with keratoconjunctivitis: Secondary | ICD-10-CM

## 2023-07-29 DIAGNOSIS — M3320 Polymyositis, organ involvement unspecified: Secondary | ICD-10-CM

## 2023-07-29 DIAGNOSIS — Z79899 Other long term (current) drug therapy: Secondary | ICD-10-CM

## 2023-07-29 DIAGNOSIS — M19071 Primary osteoarthritis, right ankle and foot: Secondary | ICD-10-CM

## 2023-07-29 DIAGNOSIS — Z96652 Presence of left artificial knee joint: Secondary | ICD-10-CM

## 2023-07-29 DIAGNOSIS — M19042 Primary osteoarthritis, left hand: Secondary | ICD-10-CM

## 2023-07-29 DIAGNOSIS — M7061 Trochanteric bursitis, right hip: Secondary | ICD-10-CM

## 2023-07-29 DIAGNOSIS — M19041 Primary osteoarthritis, right hand: Secondary | ICD-10-CM

## 2023-07-29 DIAGNOSIS — M19072 Primary osteoarthritis, left ankle and foot: Secondary | ICD-10-CM

## 2023-07-29 DIAGNOSIS — I7300 Raynaud's syndrome without gangrene: Secondary | ICD-10-CM | POA: Diagnosis not present

## 2023-07-29 DIAGNOSIS — M8589 Other specified disorders of bone density and structure, multiple sites: Secondary | ICD-10-CM

## 2023-07-29 DIAGNOSIS — Z7952 Long term (current) use of systemic steroids: Secondary | ICD-10-CM

## 2023-07-29 DIAGNOSIS — M7062 Trochanteric bursitis, left hip: Secondary | ICD-10-CM

## 2023-07-29 DIAGNOSIS — Z131 Encounter for screening for diabetes mellitus: Secondary | ICD-10-CM

## 2023-07-29 DIAGNOSIS — M160 Bilateral primary osteoarthritis of hip: Secondary | ICD-10-CM

## 2023-07-29 DIAGNOSIS — M542 Cervicalgia: Secondary | ICD-10-CM

## 2023-07-29 DIAGNOSIS — Z8639 Personal history of other endocrine, nutritional and metabolic disease: Secondary | ICD-10-CM

## 2023-07-29 NOTE — Addendum Note (Signed)
 Addended by: Adrianne Horn on: 07/29/2023 09:29 AM   Modules accepted: Orders

## 2023-08-02 LAB — SURGICAL PATHOLOGY

## 2023-08-17 ENCOUNTER — Ambulatory Visit: Admitting: Pulmonary Disease

## 2023-08-31 NOTE — Progress Notes (Unsigned)
 Office Visit Note  Patient: Connie Brady             Date of Birth: [birth year]           MRN: [id number]             PCP: Vernon Velna SAUNDERS, MD Referring: No ref. provider found Visit Date: 09/14/2023 Occupation: @GUAROCC @  Subjective:  Pain in right hip and right shoulder  History of Present Illness: Connie Brady is a 53 y.o. 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  She returns today after her last visit in June 2025.  She states she continues to have discomfort in her right shoulder and right hip.  Her right hip which she describes over the trochanteric region wakes her up in the middle of the night.  None of the other joints are painful.  She denies any increased muscular weakness or tenderness.  She has a stiffness lasting almost all day and sometimes nocturnal pain due to hip area.  She has been exercising on a regular basis about 5 times per week.  She continues to have some dryness in her eyes.  She has been using pilocarpine  5 mg usually once a day.    Activities of Daily Living:  Patient reports morning stiffness for 24 hours.   Patient Reports nocturnal pain.  Difficulty dressing/grooming: Denies Difficulty climbing stairs: Denies Difficulty getting out of chair: Reports Difficulty using hands for taps, buttons, cutlery, and/or writing: Denies  Review of Systems  Constitutional:  Negative for fatigue.  HENT:  Negative for mouth sores and mouth dryness.   Eyes:  Positive for dryness.  Respiratory:  Negative for shortness of breath.   Cardiovascular:  Negative for chest pain and palpitations.  Gastrointestinal:  Negative for blood in stool, constipation and diarrhea.  Endocrine: Negative for increased urination.  Genitourinary:  Negative for involuntary urination.  Musculoskeletal:  Positive for joint pain, joint pain, myalgias, muscle weakness, morning stiffness, muscle tenderness and myalgias. Negative for gait problem and joint swelling.  Skin:  Negative  for color change, rash, hair loss and sensitivity to sunlight.  Allergic/Immunologic: Negative for susceptible to infections.  Neurological:  Negative for dizziness and headaches.  Hematological:  Negative for swollen glands.  Psychiatric/Behavioral:  Positive for sleep disturbance. Negative for depressed mood. The patient is not nervous/anxious.     PMFS History:  Patient Active Problem List   Diagnosis Date Noted   Sjogren syndrome, unspecified (HCC) 05/22/2021   Pain of left lower leg 11/15/2020   Osteoarthritis of left knee 04/07/2019   Left lateral knee pain 08/24/2017   S/P arthroscopic surgery of left knee 08/24/2017   Abnormal cervical Papanicolaou smear 08/31/2016   Obesity 08/31/2016   Premenstrual symptom 08/31/2016   Vitamin D  deficiency 08/25/2016   History of chronic kidney disease 05/07/2016   Chronic kidney disease (CKD), stage III (moderate) (HCC) 03/30/2016   High risk medication use 01/10/2016   Primary osteoarthritis of both hips 01/10/2016   Primary osteoarthritis of both knees 01/10/2016   Primary osteoarthritis of both feet 01/10/2016   Bilateral plantar fasciitis 01/10/2016   Chest pain 01/26/2013   Polymyositis (HCC) 01/26/2013   Sjogren's syndrome (HCC)     Past Medical History:  Diagnosis Date   Anemia    Arthritis    Chronic kidney disease    stage 3   GERD (gastroesophageal reflux disease)    History of hiatal hernia    Iron deficiency    per patient   Low  vitamin B12 level    per patient   Pneumonia    Polymyositis (HCC)    also neuropathy of feet   Sjoegren syndrome     Family History  Problem Relation Age of Onset   Hypertension Sister    Hypertension Sister    Hypertension Sister    Hypertension Brother    Diabetes Brother    Hypertension Brother    Diabetes Brother    Past Surgical History:  Procedure Laterality Date   BUNIONECTOMY Bilateral    EXCISION MASS, BACK N/A 07/26/2023   Procedure: EXCISION MASS, BACK;  Surgeon:  Paola Dreama SAILOR, MD;  Location: [facility];  Service: General;  Laterality: N/A;  EXCISION OF SOFT TISSUE MASS ON BACK   KNEE ARTHROSCOPY WITH LATERAL MENISECTOMY Left 08/24/2017   Procedure: LEFT KNEE ARTHROSCOPY WITH LATERAL MENISECTOMY, CHONDROPLASTY, ARTHROSCOPIC ASSISTED INTERNAL FIXATION LATERAL TIBIAL PLATEAU;  Surgeon: Gerome Charleston, MD;  Location: [facility];  Service: Orthopedics;  Laterality: Left;   MUSCLE BIOPSY     r/thigh   PLANTAR FASCIA SURGERY Right    SHOULDER ARTHROSCOPY Left    TOTAL KNEE ARTHROPLASTY Left 04/07/2019   Procedure: TOTAL KNEE ARTHROPLASTY;  Surgeon: Gerome Charleston, MD;  Location: [facility];  Service: Orthopedics;  Laterality: Left;  adductor canal   Social History   Social History Narrative   Not on file   Immunization History  Administered Date(s) Administered   PFIZER(Purple Top)SARS-COV-2 Vaccination 08/17/2019, 09/07/2019, 06/30/2020     Objective: Vital Signs: BP 108/72 (BP Location: Left Arm, Patient Position: Sitting, Cuff Size: Normal)   Pulse 78   Resp 15   Ht 5' 8 (1.727 m)   Wt 221 lb (100.2 kg)   LMP  (LMP Unknown)   BMI 33.60 kg/m    Physical Exam Vitals and nursing note reviewed.  Constitutional:      Appearance: She is well-developed.  HENT:     Head: Normocephalic and atraumatic.  Eyes:     Conjunctiva/sclera: Conjunctivae normal.  Cardiovascular:     Rate and Rhythm: Normal rate and regular rhythm.     Heart sounds: Normal heart sounds.  Pulmonary:     Effort: Pulmonary effort is normal.     Breath sounds: Normal breath sounds.  Abdominal:     General: Bowel sounds are normal.     Palpations: Abdomen is soft.  Musculoskeletal:     Cervical back: Normal range of motion.  Lymphadenopathy:     Cervical: No cervical adenopathy.  Skin:    General: Skin is warm and dry.     Capillary Refill: Capillary refill takes less than 2 seconds.  Neurological:     Mental Status: She is alert and oriented to  person, place, and time.  Psychiatric:        Behavior: Behavior normal.      Musculoskeletal Exam: Patient had good range of motion of the cervical, thoracic and lumbar spine.  She had no difficulty getting up from the squatting position.  Shoulders, elbows, wrist joints, MCPs PIPs and DIPs with good range of motion with no synovitis.  She has some discomfort range of motion of right shoulder joint.  Hip joints, knee joints, ankles, MTPs and PIPs with good range of motion.  She had tenderness over right trochanteric bursa.  CDAI Exam: CDAI Score: -- Patient Global: --; Provider Global: -- Swollen: --; Tender: -- Joint Exam 09/14/2023   No joint exam has been documented for this visit   There is currently  no information documented on the homunculus. Go to the Rheumatology activity and complete the homunculus joint exam.  Investigation: No additional findings.  Imaging: No results found.  Recent Labs: Lab Results  Component Value Date   WBC 5.7 09/08/2023   HGB 12.7 09/08/2023   PLT 255 09/08/2023   NA 139 09/08/2023   K 4.5 09/08/2023   CL 103 09/08/2023   CO2 20 09/08/2023   GLUCOSE 92 09/08/2023   BUN 11 09/08/2023   CREATININE 1.22 (H) 09/08/2023   BILITOT 0.2 09/08/2023   ALKPHOS 75 09/08/2023   AST 16 09/08/2023   ALT 9 09/08/2023   PROT 7.0 09/08/2023   ALBUMIN 4.1 09/08/2023   CALCIUM 9.0 09/08/2023   GFRAA 50 (L) 08/02/2020   September 08, 2023 ANA positive, SSA positive, dsDNA negative, RNP negative, Smith negative, SSB negative, C3-C4 normal, urine protein creatinine ratio normal, sed rate 21, CK188, aldolase 4.2, SPEP normal, myositis panel positive for SSA 52 Kd (rest pending), creatinine 1.22, GFR 53  Speciality Comments: PLQ Eye Exam: 08/03/2022 WNL @ [facility].    Fosamax  to start date December 17, 2019  Procedures:  Large Joint Inj: R greater trochanter on 09/14/2023 2:22 PM Indications: pain Details: 27 G 1.5 in needle, lateral  approach  Arthrogram: No  Medications: 40 mg triamcinolone  acetonide 40 MG/ML; 1.5 mL lidocaine  1 % Aspirate: 0 mL Outcome: tolerated well, no immediate complications  Risk of infection, tendon injury, nerve injury, hypopigmentation and dermal atrophy were discussed. Procedure, treatment alternatives, risks and benefits explained, specific risks discussed. Consent was given by the patient. Immediately prior to procedure a time out was called to verify the correct patient, procedure, equipment, support staff and site/side marked as required. Patient was prepped and draped in the usual sterile fashion.     Allergies: Lyrica [pregabalin] and Percocet [oxycodone -acetaminophen ]   Assessment / Plan:     Visit Diagnoses: Polymyositis (HCC) - elevated CK, positive muscle biopsy, myositis panel negative, initial CK 522: She denies any increased muscular weakness or tenderness.  She good strength in all 4 extremities.  Labs from September 08, 2023 CK1 88.  High risk medication use - Imuran  50 mg 3 tablets by mouth daily, Plaquenil  200 mg p.o. daily and prednisone  6 mg daily.  September 08, 2023 CBC and CMP were normal with creatinine 1.22.  She was advised to get labs every 3 months.  Information reimmunization was placed in the AVS.  She was advised to hold Imuran  if she develops an infection resume after the infection resolves.  Sjogren's syndrome with keratoconjunctivitis sicca (HCC) - positive SSA and positive ANA.  She continues to have mild sicca symptoms which are manageable with pilocarpine .  Raynaud's phenomenon without gangrene-she denies any recent Raynaud's symptoms.  On prednisone  therapy - hemoglobin A1c was 5.6 on April 13, 2023.  Primary osteoarthritis of both hands-she had bilateral PIP and DIP thickening without any synovitis.  Primary osteoarthritis of both hips-she good range of motion without any discomfort.  Trochanteric bursitis of both hips-she has been having intermittent  discomfort in the trochanteric region with more severe pain and discomfort in the right trochanteric region.  She has been experiencing nocturnal pain.  Patient states she has been doing stretching exercises without much relief.  I gave her a handout on stretching exercises.  Per patient's request right trochanteric bursa was injected with lidocaine  and Kenalog  as described above.  She tolerated the procedure well.  Postprocedure instructions were given.  S/P total knee  arthroplasty, left-she had good range of motion without discomfort.  Primary osteoarthritis of right knee-warmth swelling or effusion was noted.  Primary osteoarthritis of both feet-perfecting shoes were discussed.  Degeneration of intervertebral disc of lumbar region without discogenic back pain or lower extremity pain-she has intermittent pain.  Osteopenia of multiple sites - calcium and vitamin D  use advised.  DEXA scan was normal on November 25, 2021.  History of vitamin D  deficiency  Stage 3a chronic kidney disease (HCC)-followed by [facility].  Creatinine remains elevated.  Orders: No orders of the defined types were placed in this encounter.  No orders of the defined types were placed in this encounter.    Follow-Up Instructions: Return in about 5 months (around 02/14/2024).   Maya Nash, MD  Note - This record has been created using Animal nutritionist.  Chart creation errors have been sought, but may not always  have been located. Such creation errors do not reflect on  the standard of medical care.

## 2023-09-08 ENCOUNTER — Other Ambulatory Visit: Payer: Self-pay | Admitting: *Deleted

## 2023-09-08 DIAGNOSIS — M3320 Polymyositis, organ involvement unspecified: Secondary | ICD-10-CM

## 2023-09-08 DIAGNOSIS — Z79899 Other long term (current) drug therapy: Secondary | ICD-10-CM

## 2023-09-08 DIAGNOSIS — M3501 Sicca syndrome with keratoconjunctivitis: Secondary | ICD-10-CM

## 2023-09-08 NOTE — Addendum Note (Signed)
 Addended by: KNUTE REENA DEL on: 09/08/2023 09:50 AM   Modules accepted: Orders

## 2023-09-08 NOTE — Addendum Note (Signed)
 Addended by: KNUTE REENA DEL on: 09/08/2023 09:49 AM   Modules accepted: Orders

## 2023-09-09 NOTE — Progress Notes (Signed)
 Creatinine remains elevated-1.22 and Gfr is low-53--improved from June. Rest of CMP WNL.   CBC WNL

## 2023-09-13 ENCOUNTER — Ambulatory Visit: Payer: Self-pay | Admitting: Rheumatology

## 2023-09-13 NOTE — Progress Notes (Signed)
I will discuss results at the follow-up.

## 2023-09-14 ENCOUNTER — Ambulatory Visit: Payer: 59 | Attending: Rheumatology | Admitting: Rheumatology

## 2023-09-14 ENCOUNTER — Encounter: Payer: Self-pay | Admitting: Rheumatology

## 2023-09-14 VITALS — BP 108/72 | HR 78 | Resp 15 | Ht 68.0 in | Wt 221.0 lb

## 2023-09-14 DIAGNOSIS — M19042 Primary osteoarthritis, left hand: Secondary | ICD-10-CM

## 2023-09-14 DIAGNOSIS — I7300 Raynaud's syndrome without gangrene: Secondary | ICD-10-CM | POA: Diagnosis not present

## 2023-09-14 DIAGNOSIS — M7062 Trochanteric bursitis, left hip: Secondary | ICD-10-CM

## 2023-09-14 DIAGNOSIS — M8589 Other specified disorders of bone density and structure, multiple sites: Secondary | ICD-10-CM

## 2023-09-14 DIAGNOSIS — N1831 Chronic kidney disease, stage 3a: Secondary | ICD-10-CM

## 2023-09-14 DIAGNOSIS — Z79899 Other long term (current) drug therapy: Secondary | ICD-10-CM

## 2023-09-14 DIAGNOSIS — M3320 Polymyositis, organ involvement unspecified: Secondary | ICD-10-CM

## 2023-09-14 DIAGNOSIS — M51369 Other intervertebral disc degeneration, lumbar region without mention of lumbar back pain or lower extremity pain: Secondary | ICD-10-CM

## 2023-09-14 DIAGNOSIS — M19071 Primary osteoarthritis, right ankle and foot: Secondary | ICD-10-CM

## 2023-09-14 DIAGNOSIS — M19072 Primary osteoarthritis, left ankle and foot: Secondary | ICD-10-CM

## 2023-09-14 DIAGNOSIS — M19041 Primary osteoarthritis, right hand: Secondary | ICD-10-CM

## 2023-09-14 DIAGNOSIS — M3501 Sicca syndrome with keratoconjunctivitis: Secondary | ICD-10-CM | POA: Diagnosis not present

## 2023-09-14 DIAGNOSIS — Z7952 Long term (current) use of systemic steroids: Secondary | ICD-10-CM

## 2023-09-14 DIAGNOSIS — M1711 Unilateral primary osteoarthritis, right knee: Secondary | ICD-10-CM

## 2023-09-14 DIAGNOSIS — M7061 Trochanteric bursitis, right hip: Secondary | ICD-10-CM | POA: Diagnosis not present

## 2023-09-14 DIAGNOSIS — M160 Bilateral primary osteoarthritis of hip: Secondary | ICD-10-CM

## 2023-09-14 DIAGNOSIS — Z96652 Presence of left artificial knee joint: Secondary | ICD-10-CM

## 2023-09-14 DIAGNOSIS — Z8639 Personal history of other endocrine, nutritional and metabolic disease: Secondary | ICD-10-CM

## 2023-09-14 MED ORDER — LIDOCAINE HCL 1 % IJ SOLN
1.5000 mL | INTRAMUSCULAR | Status: AC | PRN
  Administered 2023-09-14: 1.5 mL

## 2023-09-14 MED ORDER — TRIAMCINOLONE ACETONIDE 40 MG/ML IJ SUSP
40.0000 mg | INTRAMUSCULAR | Status: AC | PRN
  Administered 2023-09-14: 40 mg via INTRA_ARTICULAR

## 2023-09-14 NOTE — Patient Instructions (Addendum)
 Standing Labs We placed an order today for your standing lab work.   Please have your standing labs drawn in October and every 3 months  Please have your labs drawn 2 weeks prior to your appointment so that the provider can discuss your lab results at your appointment, if possible.  Please note that you may see your imaging and lab results in MyChart before we have reviewed them. We will contact you once all results are reviewed. Please allow our office up to 72 hours to thoroughly review all of the results before contacting the office for clarification of your results.  WALK-IN LAB HOURS  Monday through Thursday from 8:00 am -12:30 pm and 1:00 pm-4:30 pm and Friday from 8:00 am-12:00 pm.  Patients with office visits requiring labs will be seen before walk-in labs.  You may encounter longer than normal wait times. Please allow additional time. Wait times may be shorter on  Monday and Thursday afternoons.  We do not book appointments for walk-in labs. We appreciate your patience and understanding with our staff.   Labs are drawn by [facility]. Please bring your co-pay at the time of your lab draw.  You may receive a bill from [facility] for your lab work.  Please note if you are on Hydroxychloroquine  and and an order has been placed for a Hydroxychloroquine  level,  you will need to have it drawn 4 hours or more after your last dose.  If you wish to have your labs drawn at another location, please call the office 24 hours in advance so we can fax the orders.  The office is located at [address], [place], [place] [address]   If you have any questions regarding directions or hours of operation,  please call [phone].   As a reminder, please drink plenty of water  prior to coming for your lab work. Thanks!   Vaccines You are taking a medication(s) that can suppress your immune system.  The following immunizations are recommended: Flu annually Covid-19  Td/Tdap (tetanus,  diphtheria, pertussis) every 10 years Pneumonia (Prevnar 15 then Pneumovax 23 at least 1 year apart.  Alternatively, can take Prevnar 20 without needing additional dose) Shingrix: 2 doses from 4 weeks to 6 months apart  Please check with your PCP to make sure you are up to date.   If you have signs or symptoms of an infection or start antibiotics: First, call your PCP for workup of your infection. Hold your medication through the infection, until you complete your antibiotics, and until symptoms resolve if you take the following: Injectable medication (Actemra, Benlysta, Cimzia, Cosentyx, Enbrel, Humira, Kevzara, Orencia, Remicade, Simponi, Stelara, Taltz, Tremfya) Methotrexate Leflunomide (Arava) Mycophenolate (Cellcept) Earma Jewel, or Rinvoq   Iliotibial Band Syndrome Rehab Ask your health care provider which exercises are safe for you. Do exercises exactly as told by your provider and adjust them as told. It's normal to feel mild stretching, pulling, tightness, or discomfort as you do these exercises. Stop right away if you feel sudden pain or your pain gets a lot worse. Do not begin these exercises until told by your provider. Stretching and range-of-motion exercises These exercises warm up your muscles and joints. They also improve the movement and flexibility of your hip and pelvis. Quadriceps stretch, prone  Lie face down (prone) on a firm surface like a bed or padded floor. Bend your left / right knee. Reach back to hold your ankle or pant leg. If you can't reach your ankle or  pant leg, use a belt looped around your foot and grab the belt instead. Gently pull your heel toward your butt. Your knee should not slide out to the side. You should feel a stretch in the front of your thigh and knee, also called the quadriceps. Hold this position for __________ seconds. Repeat __________ times. Complete this exercise __________ times a day. Iliotibial band stretch The iliotibial band  is a strip of tissue that runs along the outside of your hip down to your knee. Lie on your side with your left / right leg on top. Bend both knees and grab your left / right ankle. Stretch out your bottom arm to help you balance. Slowly bring your top knee back so your thigh goes behind your back. Slowly lower your top leg toward the floor until you feel a gentle stretch on the outside of your left / right hip and thigh. If you don't feel a stretch and your knee won't go farther, place the heel of your other foot on top of your knee and pull your knee down toward the floor with your foot. Hold this position for __________ seconds. Repeat __________ times. Complete this exercise __________ times a day. Strengthening exercises These exercises build strength and endurance in your hip and pelvis. Endurance means your muscles can keep working even when they're tired. Straight leg raises, side-lying This exercise strengthens the muscles that rotate the leg at the hip and move it away from your body. These muscles are called hip abductors. Lie on your side with your left / right leg on top. Lie so your head, shoulder, hip, and knee line up. You can bend your bottom knee to help you balance. Roll your hips slightly forward so they're stacked directly over each other. Your left / right knee should face forward. Tense the muscles in your outer thigh and hip. Lift your top leg 4-6 inches (10-15 cm) off the ground. Hold this position for __________ seconds. Slowly lower your leg back down to the starting position. Let your muscles fully relax before doing this exercise again. Repeat __________ times. Complete this exercise __________ times a day. Leg raises, prone This exercise strengthens the muscles that move the hips backward. These muscles are called hip extensors. Lie face down (prone) on your bed or a firm surface. You can put a pillow under your hips for comfort and to support your lower back. Bend  your left / right knee so your foot points straight up toward the ceiling. Keep the other leg straight and behind you. Squeeze your butt muscles. Lift your left / right thigh off the firm surface. Do not let your back arch. Tense your thigh muscle as hard as you can without having more knee pain. Hold this position for __________ seconds. Slowly lower your leg to the starting position. Allow your leg to relax all the way. Repeat __________ times. Complete this exercise __________ times a day. Hip hike  Stand sideways on a bottom step. Place your feet so that your left / right leg is on the step, and the other foot is hanging off the side. If you need support for balance, hold onto a railing or wall. Keep your knees straight and your abdomen square, meaning your hips are level. Then, lift your left / right hip up toward the ceiling. Slowly let your leg that's hanging off the step lower towards the floor. Your foot should get closer to the ground. Do not lean or bend your knees  during this movement. Repeat __________ times. Complete this exercise __________ times a day. This information is not intended to replace advice given to you by your health care provider. Make sure you discuss any questions you have with your health care provider. Document Revised: 04/17/2022 Document Reviewed: 04/17/2022 Elsevier Patient Education  2024 [employer]  Shoulder Exercises Ask your health care provider which exercises are safe for you. Do exercises exactly as told by your health care provider and adjust them as directed. It is normal to feel mild stretching, pulling, tightness, or discomfort as you do these exercises. Stop right away if you feel sudden pain or your pain gets worse. Do not begin these exercises until told by your health care provider. Stretching exercises External rotation and abduction This exercise is sometimes called corner stretch. The exercise rotates your arm outward (external  rotation) and moves your arm out from your body (abduction). Stand in a doorway with one of your feet slightly in front of the other. This is called a staggered stance. If you cannot reach your forearms to the door frame, stand facing a corner of a room. Choose one of the following positions as told by your health care provider: Place your hands and forearms on the door frame above your head. Place your hands and forearms on the door frame at the height of your head. Place your hands on the door frame at the height of your elbows. Slowly move your weight onto your front foot until you feel a stretch across your chest and in the front of your shoulders. Keep your head and chest upright and keep your abdominal muscles tight. Hold for __________ seconds. To release the stretch, shift your weight to your back foot. Repeat __________ times. Complete this exercise __________ times a day. Extension, standing  Stand and hold a broomstick, a cane, or a similar object behind your back. Your hands should be a little wider than shoulder-width apart. Your palms should face away from your back. Keeping your elbows straight and your shoulder muscles relaxed, move the stick away from your body until you feel a stretch in your shoulders (extension). Avoid shrugging your shoulders while you move the stick. Keep your shoulder blades tucked down toward the middle of your back. Hold for __________ seconds. Slowly return to the starting position. Repeat __________ times. Complete this exercise __________ times a day. Range-of-motion exercises Pendulum  Stand near a wall or a surface that you can hold onto for balance. Bend at the waist and let your left / right arm hang straight down. Use your other arm to support you. Keep your back straight and do not lock your knees. Relax your left / right arm and shoulder muscles, and move your hips and your trunk so your left / right arm swings freely. Your arm should swing  because of the motion of your body, not because you are using your arm or shoulder muscles. Keep moving your hips and trunk so your arm swings in the following directions, as told by your health care provider: Side to side. Forward and backward. In clockwise and counterclockwise circles. Continue each motion for __________ seconds, or for as long as told by your health care provider. Slowly return to the starting position. Repeat __________ times. Complete this exercise __________ times a day. Shoulder flexion, standing  Stand and hold a broomstick, a cane, or a similar object. Place your hands a little more than shoulder-width apart on the object. Your left / right hand  should be palm-up, and your other hand should be palm-down. Keep your elbow straight and your shoulder muscles relaxed. Push the stick up with your healthy arm to raise your left / right arm in front of your body, and then over your head until you feel a stretch in your shoulder (flexion). Avoid shrugging your shoulder while you raise your arm. Keep your shoulder blade tucked down toward the middle of your back. Hold for __________ seconds. Slowly return to the starting position. Repeat __________ times. Complete this exercise __________ times a day. Shoulder abduction, standing  Stand and hold a broomstick, a cane, or a similar object. Place your hands a little more than shoulder-width apart on the object. Your left / right hand should be palm-up, and your other hand should be palm-down. Keep your elbow straight and your shoulder muscles relaxed. Push the object across your body toward your left / right side. Raise your left / right arm to the side of your body (abduction) until you feel a stretch in your shoulder. Do not raise your arm above shoulder height unless your health care provider tells you to do that. If directed, raise your arm over your head. Avoid shrugging your shoulder while you raise your arm. Keep your  shoulder blade tucked down toward the middle of your back. Hold for __________ seconds. Slowly return to the starting position. Repeat __________ times. Complete this exercise __________ times a day. Internal rotation  Place your left / right hand behind your back, palm-up. Use your other hand to dangle an exercise band, a broomstick, or a similar object over your shoulder. Grasp the band with your left / right hand so you are holding on to both ends. Gently pull up on the band until you feel a stretch in the front of your left / right shoulder. The movement of your arm toward the center of your body is called internal rotation. Avoid shrugging your shoulder while you raise your arm. Keep your shoulder blade tucked down toward the middle of your back. Hold for __________ seconds. Release the stretch by letting go of the band and lowering your hands. Repeat __________ times. Complete this exercise __________ times a day. Strengthening exercises External rotation  Sit in a stable chair without armrests. Secure an exercise band to a stable object at elbow height on your left / right side. Place a soft object, such as a folded towel or a small pillow, between your left / right upper arm and your body to move your elbow about 4 inches (10 cm) away from your side. Hold the end of the exercise band so it is tight and there is no slack. Keeping your elbow pressed against the soft object, slowly move your forearm out, away from your abdomen (external rotation). Keep your body steady so only your forearm moves. Hold for __________ seconds. Slowly return to the starting position. Repeat __________ times. Complete this exercise __________ times a day. Shoulder abduction  Sit in a stable chair without armrests, or stand up. Hold a __________ lb / kg weight in your left / right hand, or hold an exercise band with both hands. Start with your arms straight down and your left / right palm facing in,  toward your body. Slowly lift your left / right hand out to your side (abduction). Do not lift your hand above shoulder height unless your health care provider tells you that this is safe. Keep your arms straight. Avoid shrugging your shoulder while you do  this movement. Keep your shoulder blade tucked down toward the middle of your back. Hold for __________ seconds. Slowly lower your arm, and return to the starting position. Repeat __________ times. Complete this exercise __________ times a day. Shoulder extension  Sit in a stable chair without armrests, or stand up. Secure an exercise band to a stable object in front of you so it is at shoulder height. Hold one end of the exercise band in each hand. Straighten your elbows and lift your hands up to shoulder height. Squeeze your shoulder blades together as you pull your hands down to the sides of your thighs (extension). Stop when your hands are straight down by your sides. Do not let your hands go behind your body. Hold for __________ seconds. Slowly return to the starting position. Repeat __________ times. Complete this exercise __________ times a day. Shoulder row  Sit in a stable chair without armrests, or stand up. Secure an exercise band to a stable object in front of you so it is at chest height. Hold one end of the exercise band in each hand. Position your palms so that your thumbs are facing the ceiling (neutral position). Bend each of your elbows to a 90-degree angle (right angle) and keep your upper arms at your sides. Step back or move the chair back until the band is tight and there is no slack. Slowly pull your elbows back behind you. Hold for __________ seconds. Slowly return to the starting position. Repeat __________ times. Complete this exercise __________ times a day. Shoulder press-ups  Sit in a stable chair that has armrests. Sit upright, with your feet flat on the floor. Put your hands on the armrests so your  elbows are bent and your fingers are pointing forward. Your hands should be about even with the sides of your body. Push down on the armrests and use your arms to lift yourself off the chair. Straighten your elbows and lift yourself up as much as you comfortably can. Move your shoulder blades down, and avoid letting your shoulders move up toward your ears. Keep your feet on the ground. As you get stronger, your feet should support less of your body weight as you lift yourself up. Hold for __________ seconds. Slowly lower yourself back into the chair. Repeat __________ times. Complete this exercise __________ times a day. Wall push-ups  Stand so you are facing a stable wall. Your feet should be about one arm-length away from the wall. Lean forward and place your palms on the wall at shoulder height. Keep your feet flat on the floor as you bend your elbows and lean forward toward the wall. Hold for __________ seconds. Straighten your elbows to push yourself back to the starting position. Repeat __________ times. Complete this exercise __________ times a day. This information is not intended to replace advice given to you by your health care provider. Make sure you discuss any questions you have with your health care provider. Document Revised: 03/25/2021 Document Reviewed: 03/25/2021 Elsevier Patient Education  2024 [employer].

## 2023-09-21 LAB — MYOMARKER 3 PLUS PROFILE (RDL)
Anti-EJ Ab (RDL): NEGATIVE
Anti-Jo-1 Ab (RDL): <20 U (ref <20)
Anti-Ku Ab (RDL): NEGATIVE
Anti-MDA-5 Ab (CADM-140)(RDL): <20 U (ref <20)
Anti-Mi-2 Ab (RDL): NEGATIVE
Anti-NXP-2 (P140) Ab (RDL): <20 U (ref <20)
Anti-OJ Ab (RDL): NEGATIVE
Anti-PL-12 Ab (RDL: NEGATIVE
Anti-PL-7 Ab (RDL): NEGATIVE
Anti-PM/Scl-100 Ab (RDL): <20 U (ref <20)
Anti-SAE1 Ab, IgG (RDL): <20 U (ref <20)
Anti-SRP Ab (RDL): NEGATIVE
Anti-SS-A 52kD Ab, IgG (RDL): 43 U — ABNORMAL HIGH (ref <20)
Anti-TIF-1gamma Ab (RDL): <20 U (ref <20)
Anti-U1 RNP Ab (RDL): <20 U (ref <20)
Anti-U2 RNP Ab (RDL): NEGATIVE
Anti-U3 RNP (Fibrillarin)(RDL): NEGATIVE

## 2023-09-21 LAB — PROTEIN / CREATININE RATIO, URINE
Creatinine, Urine: 166.2 mg/dL
Protein, Ur: 6 mg/dL
Protein/Creat Ratio: 36 mg/g{creat} (ref 0–200)

## 2023-09-21 LAB — COMPREHENSIVE METABOLIC PANEL WITH GFR
ALT: 9 IU/L (ref 0–32)
AST: 16 IU/L (ref 0–40)
Albumin: 4.1 g/dL (ref 3.8–4.9)
Alkaline Phosphatase: 75 IU/L (ref 44–121)
BUN/Creatinine Ratio: 9 (ref 9–23)
BUN: 11 mg/dL (ref 6–24)
Bilirubin Total: 0.2 mg/dL (ref 0.0–1.2)
CO2: 20 mmol/L (ref 20–29)
Calcium: 9 mg/dL (ref 8.7–10.2)
Chloride: 103 mmol/L (ref 96–106)
Creatinine, Ser: 1.22 mg/dL — ABNORMAL HIGH (ref 0.57–1.00)
Globulin, Total: 2.9 g/dL (ref 1.5–4.5)
Glucose: 92 mg/dL (ref 70–99)
Potassium: 4.5 mmol/L (ref 3.5–5.2)
Sodium: 139 mmol/L (ref 134–144)
Total Protein: 7 g/dL (ref 6.0–8.5)
eGFR: 53 mL/min/1.73 — ABNORMAL LOW (ref >59)

## 2023-09-21 LAB — PROTEIN ELECTROPHORESIS, SERUM, WITH REFLEX
A/G Ratio: 1.3 (ref 0.7–1.7)
Albumin ELP: 3.9 g/dL (ref 2.9–4.4)
Alpha 1: 0.2 g/dL (ref 0.0–0.4)
Alpha 2: 0.7 g/dL (ref 0.4–1.0)
Beta: 1.1 g/dL (ref 0.7–1.3)
Gamma Globulin: 1.2 g/dL (ref 0.4–1.8)
Globulin, Total: 3.1 g/dL (ref 2.2–3.9)

## 2023-09-21 LAB — CBC WITH DIFFERENTIAL/PLATELET
Basophils Absolute: 0 x10E3/uL (ref 0.0–0.2)
Basos: 0 %
EOS (ABSOLUTE): 0 x10E3/uL (ref 0.0–0.4)
Eos: 0 %
Hematocrit: 39.8 % (ref 34.0–46.6)
Hemoglobin: 12.7 g/dL (ref 11.1–15.9)
Immature Grans (Abs): 0 x10E3/uL (ref 0.0–0.1)
Immature Granulocytes: 0 %
Lymphocytes Absolute: 1.3 x10E3/uL (ref 0.7–3.1)
Lymphs: 22 %
MCH: 29.1 pg (ref 26.6–33.0)
MCHC: 31.9 g/dL (ref 31.5–35.7)
MCV: 91 fL (ref 79–97)
Monocytes Absolute: 0.3 x10E3/uL (ref 0.1–0.9)
Monocytes: 5 %
Neutrophils Absolute: 4.1 x10E3/uL (ref 1.4–7.0)
Neutrophils: 73 %
Platelets: 255 x10E3/uL (ref 150–450)
RBC: 4.37 x10E6/uL (ref 3.77–5.28)
RDW: 13.1 % (ref 11.7–15.4)
WBC: 5.7 x10E3/uL (ref 3.4–10.8)

## 2023-09-21 LAB — ANA W/REFLEX: Anti Nuclear Antibody (ANA): POSITIVE — AB

## 2023-09-21 LAB — ENA+DNA/DS+SJORGEN'S
ENA RNP Ab: <0.2 AI (ref 0.0–0.9)
ENA SM Ab Ser-aCnc: <0.2 AI (ref 0.0–0.9)
ENA SSB (LA) Ab: <0.2 AI (ref 0.0–0.9)

## 2023-09-21 LAB — SEDIMENTATION RATE: Sed Rate: 21 mm/h (ref 0–40)

## 2023-09-21 LAB — ALDOLASE: Aldolase: 4.2 U/L (ref 3.3–10.3)

## 2023-09-21 LAB — ANTI-DNA ANTIBODY, DOUBLE-STRANDED: dsDNA Ab: 1 [IU]/mL (ref 0–9)

## 2023-09-21 LAB — CK: Total CK: 188 U/L — ABNORMAL HIGH (ref 32–182)

## 2023-09-21 LAB — C3 AND C4
Complement C3, Serum: 128 mg/dL (ref 82–167)
Complement C4, Serum: 29 mg/dL (ref 12–38)

## 2023-09-21 LAB — SJOGRENS SYNDROME-A EXTRACTABLE NUCLEAR ANTIBODY: ENA SSA (RO) Ab: >8 AI — ABNORMAL HIGH (ref 0.0–0.9)

## 2023-11-08 ENCOUNTER — Other Ambulatory Visit: Payer: Self-pay | Admitting: Rheumatology

## 2023-11-09 NOTE — Telephone Encounter (Signed)
 Last Fill: 06/17/2023 (PLQ) and 07/13/2023 (prednisone )   Eye exam: 08/03/2022   Labs: 09/08/2023 Creatinine remains elevated-1.22 and Gfr is low-53--improved from June. Rest of CMP WNL.   CBC WNL  Next Visit: 02/14/2024  Last Visit: 09/14/2023  DX: Polymyositis   Current Dose per office note on 09/14/2023: Plaquenil  200 mg p.o. daily and prednisone  6 mg daily.   Patient states she updated her PLQ eye exam on 10/25/2023 at [facility]. I called to obtain report.   Okay to refill Plaquenil  and prednisone ?

## 2023-11-25 ENCOUNTER — Other Ambulatory Visit: Payer: Self-pay | Admitting: Rheumatology

## 2023-11-25 NOTE — Telephone Encounter (Signed)
 Last Fill: 06/24/2023  Labs: 09/08/2023 Creatinine remains elevated-1.22 and Gfr is low-53--improved from June. Rest of CMP WNL.   CBC WNL  Next Visit: 02/14/2024  Last Visit: 09/14/2023  DX:  Polymyositis    Current Dose per office note 09/14/2023: Imuran  50 mg 3 tablets by mouth daily   Okay to refill Imuran ?

## 2024-01-31 NOTE — Progress Notes (Unsigned)
 "  Office Visit Note  Patient: Connie Brady             Date of Birth: 1970/07/18           MRN: [id number]             PCP: Vernon Velna SAUNDERS, MD Referring: Vernon Velna SAUNDERS, MD Visit Date: 02/14/2024 Occupation: Data Unavailable  Subjective:  Medication monitoring   History of Present Illness: Connie Brady is a 53 y.o. female with history of polymyositis and sjogren's syndrome.  Patient remains on Imuran  50 mg 3 tablets by mouth daily, Plaquenil  200 mg p.o. daily and prednisone  6 mg daily.  She is tolerating combination therapy without any side effects and has not had any gaps in therapy.  She denies any signs or symptoms of a myositis flare.  Patient experiences some soreness in her lower legs which she attributes to her exercise regimen.  For the past 3 weeks she has had increased swelling in the left knee replacement which she attributes to on dancing.  She denies any mechanical symptoms.  She has not been using any ice, compression, or topical agents. She denies any recent or recurrent infections.  She denies any new medical conditions.     Activities of Daily Living:  Patient reports morning stiffness for 24 hours.   Patient Reports nocturnal pain.  Difficulty dressing/grooming: Denies Difficulty climbing stairs: Denies Difficulty getting out of chair: Reports Difficulty using hands for taps, buttons, cutlery, and/or writing: Denies  Review of Systems  Constitutional:  Positive for fatigue.  HENT:  Negative for mouth sores and mouth dryness.   Eyes:  Positive for dryness.  Respiratory:  Negative for shortness of breath.   Cardiovascular:  Negative for chest pain and palpitations.  Gastrointestinal:  Negative for blood in stool, constipation and diarrhea.  Endocrine: Negative for increased urination.  Genitourinary:  Negative for involuntary urination.  Musculoskeletal:  Positive for joint pain, joint pain, joint swelling, muscle weakness, morning stiffness and  muscle tenderness. Negative for gait problem, myalgias and myalgias.  Skin:  Positive for sensitivity to sunlight. Negative for color change, rash and hair loss.  Allergic/Immunologic: Negative for susceptible to infections.  Neurological:  Negative for dizziness and headaches.  Hematological:  Negative for swollen glands.  Psychiatric/Behavioral:  Positive for sleep disturbance. Negative for depressed mood. The patient is not nervous/anxious.     PMFS History:  Patient Active Problem List   Diagnosis Date Noted   Sjogren syndrome, unspecified 05/22/2021   Pain of left lower leg 11/15/2020   Osteoarthritis of left knee 04/07/2019   Left lateral knee pain 08/24/2017   S/P arthroscopic surgery of left knee 08/24/2017   Abnormal cervical Papanicolaou smear 08/31/2016   Obesity 08/31/2016   Premenstrual symptom 08/31/2016   Vitamin D  deficiency 08/25/2016   History of chronic kidney disease 05/07/2016   Chronic kidney disease (CKD), stage III (moderate) (HCC) 03/30/2016   High risk medication use 01/10/2016   Primary osteoarthritis of both hips 01/10/2016   Primary osteoarthritis of both knees 01/10/2016   Primary osteoarthritis of both feet 01/10/2016   Bilateral plantar fasciitis 01/10/2016   Chest pain 01/26/2013   Polymyositis (HCC) 01/26/2013   Sjogren's syndrome     Past Medical History:  Diagnosis Date   Anemia    Arthritis    Chronic kidney disease    stage 3   GERD (gastroesophageal reflux disease)    History of hiatal hernia    Iron deficiency  per patient   Low vitamin B12 level    per patient   Pneumonia    Polymyositis (HCC)    also neuropathy of feet   Sjoegren syndrome     Family History  Problem Relation Age of Onset   Hypertension Sister    Hypertension Sister    Hypertension Sister    Hypertension Brother    Diabetes Brother    Hypertension Brother    Diabetes Brother    Past Surgical History:  Procedure Laterality Date   BUNIONECTOMY  Bilateral    EXCISION MASS, BACK N/A 07/26/2023   Procedure: EXCISION MASS, BACK;  Surgeon: Paola Dreama SAILOR, MD;  Location: [facility];  Service: General;  Laterality: N/A;  EXCISION OF SOFT TISSUE MASS ON BACK   KNEE ARTHROSCOPY WITH LATERAL MENISECTOMY Left 08/24/2017   Procedure: LEFT KNEE ARTHROSCOPY WITH LATERAL MENISECTOMY, CHONDROPLASTY, ARTHROSCOPIC ASSISTED INTERNAL FIXATION LATERAL TIBIAL PLATEAU;  Surgeon: Gerome Charleston, MD;  Location: [facility];  Service: Orthopedics;  Laterality: Left;   MUSCLE BIOPSY     r/thigh   PLANTAR FASCIA SURGERY Right    SHOULDER ARTHROSCOPY Left    TOTAL KNEE ARTHROPLASTY Left 04/07/2019   Procedure: TOTAL KNEE ARTHROPLASTY;  Surgeon: Gerome Charleston, MD;  Location: [facility];  Service: Orthopedics;  Laterality: Left;  adductor canal   Social History[1] Social History   Social History Narrative   Not on file     Immunization History  Administered Date(s) Administered   PFIZER(Purple Top)SARS-COV-2 Vaccination 08/17/2019, 09/07/2019, 06/30/2020     Objective: Vital Signs: BP 108/70   Pulse 83   Temp 97.8 F (36.6 C)   Resp 14   Ht 5' 8 (1.727 m)   Wt 213 lb (96.6 kg)   LMP  (LMP Unknown)   BMI 32.39 kg/m    Physical Exam Vitals and nursing note reviewed.  Constitutional:      Appearance: She is well-developed.  HENT:     Head: Normocephalic and atraumatic.  Eyes:     Conjunctiva/sclera: Conjunctivae normal.  Cardiovascular:     Rate and Rhythm: Normal rate and regular rhythm.     Heart sounds: Normal heart sounds.  Pulmonary:     Effort: Pulmonary effort is normal.     Breath sounds: Normal breath sounds.  Abdominal:     General: Bowel sounds are normal.     Palpations: Abdomen is soft.  Musculoskeletal:     Cervical back: Normal range of motion.  Lymphadenopathy:     Cervical: No cervical adenopathy.  Skin:    General: Skin is warm and dry.     Capillary Refill: Capillary refill takes less than 2 seconds.   Neurological:     Mental Status: She is alert and oriented to person, place, and time.  Psychiatric:        Behavior: Behavior normal.      Musculoskeletal Exam: C-spine, thoracic spine, lumbar spine have good range of motion.  No midline spinal tenderness.  No SI joint tenderness.  Shoulder joints, elbow joints, wrist joints, MCPs, PIPs, DIPs have good range of motion with no synovitis.  Complete fist formation bilaterally.  Hip joints have good range of motion with no groin pain.  Left knee replacement has good range of motion with some discomfort.  Ankle joints have good range of motion no tenderness or joint swelling.     CDAI Exam: CDAI Score: -- Patient Global: --; Provider Global: -- Swollen: --; Tender: -- Joint Exam 02/14/2024  No joint exam has been documented for this visit   There is currently no information documented on the homunculus. Go to the Rheumatology activity and complete the homunculus joint exam.  Investigation: No additional findings.  Imaging: No results found.  Recent Labs: Lab Results  Component Value Date   WBC 5.7 09/08/2023   HGB 12.7 09/08/2023   PLT 255 09/08/2023   NA 139 09/08/2023   K 4.5 09/08/2023   CL 103 09/08/2023   CO2 20 09/08/2023   GLUCOSE 92 09/08/2023   BUN 11 09/08/2023   CREATININE 1.22 (H) 09/08/2023   BILITOT 0.2 09/08/2023   ALKPHOS 75 09/08/2023   AST 16 09/08/2023   ALT 9 09/08/2023   PROT 7.0 09/08/2023   ALBUMIN 4.1 09/08/2023   CALCIUM 9.0 09/08/2023   GFRAA 50 (L) 08/02/2020    Speciality Comments: PLQ Eye Exam: 08/03/2022 WNL @ [facility].    Fosamax  to start date December 17, 2019  Procedures:  No procedures performed Allergies: Percocet [oxycodone -acetaminophen ] and Pregabalin   Assessment / Plan:     Visit Diagnoses: Polymyositis (HCC) - elevated CK, positive muscle biopsy, myositis panel negative, initial CK 522: She has not had any signs or symptoms of a myositis flare.  She is clinically  been doing well taking Imuran  50 mg 3 tablets by mouth daily, Plaquenil  200 mg 1 tablet daily, prednisone  6 mg daily.  She is tolerating combination therapy without any side effects and has not had any gaps in therapy.  She is not experiencing any muscular weakness in the upper and lower extremities.  She is able to arise from a seated position without difficulty and is able to raise the arms above the head.  CK was 188-stable on 09/08/23.  CK will be rechecked today.  She was advised to notify us  if she develops any signs or symptoms of a flare.  She will follow-up in the office in 5 months or sooner if needed.- Plan: CK  High risk medication use - Imuran  50 mg 3 tablets by mouth daily, Plaquenil  200 mg p.o. daily and prednisone  6 mg daily.  CBC and CMP updated on 09/08/23.   PLQ Eye Exam: 08/03/2022 WNL @ [facility].  Plan to call for updated Plaquenil  examination. 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  She takes pilocarpine  5 mg 1 to 2 tablets daily as needed for symptomatic relief.  Discussed the use of over-the-counter products.  Plan to obtain the following lab work for further evaluation. - Plan: Serum protein electrophoresis with reflex, Urinalysis, Routine w reflex microscopic, Sedimentation rate, C3 and C4  On prednisone  therapy -Prednisone  6 mg daily-unable to taper.  Plan to check hemoglobin A1c.  Plan: HgB A1c  Raynaud's phenomenon without gangrene: Intermittent symptoms of Raynaud's phenomenon.  No signs of sclerodactyly.  Primary osteoarthritis of both hands: No tenderness or synovitis.  Complete fist formation bilaterally.  Primary osteoarthritis of both hips: No groin pain currently.   Trochanteric bursitis of both hips: Not currently symptomatic.   S/P total knee arthroplasty, left: She has been experiencing increased discomfort  in the left knee joint for the past 3 weeks.  No mechanical symptoms.  She is noticing increased swelling which he attributes to when dancing.  According to the patient she has been told that she has to avoid using any topical agents since the knee has been  replaced.  Discussed the use of ice and a compression sleeve.  Patient plans on reaching out to the orthopedist to discuss options if her symptoms persist or worsen.  Primary osteoarthritis of right knee: No warmth or effusion noted.  Primary osteoarthritis of both feet: She is not experiencing any increased discomfort in her feet at this time.  She is good range of motion of both ankle joints with no tenderness or joint swelling.  She is wearing proper fitting shoes.  Other medical conditions are listed as follows:   Degeneration of intervertebral disc of lumbar region without discogenic back pain or lower extremity pain: No symptoms of radiculopathy.  Osteopenia of multiple sites - She takes vitamin D  daily.   DEXA scan was normal on November 25, 2021.  History of vitamin D  deficiency: She is taking a daily vitamin D  supplement.   Stage 3a chronic kidney disease (HCC): Creatinine was 1.22 and GFR was 53 on 09/08/2023.  Orders: Orders Placed This Encounter  Procedures   CBC with Differential/Platelet   Comprehensive metabolic panel with GFR   CK   HgB A1c   Serum protein electrophoresis with reflex   Urinalysis, Routine w reflex microscopic   Sedimentation rate   C3 and C4   No orders of the defined types were placed in this encounter.    Follow-Up Instructions: Return in about 5 months (around 07/14/2024) for Polymyositis, Sjogren's syndrome.   Waddell CHRISTELLA Craze, PA-C  Note - This record has been created using Dragon software.  Chart creation errors have been sought, but may not always  have been located. Such creation errors do not reflect on  the standard of medical care.     [1]  Social History Tobacco Use   Smoking  status: Never    Passive exposure: Past   Smokeless tobacco: Never  Vaping Use   Vaping status: Never Used  Substance Use Topics   Alcohol use: No   Drug use: No   "

## 2024-02-01 ENCOUNTER — Other Ambulatory Visit: Payer: Self-pay | Admitting: *Deleted

## 2024-02-01 MED ORDER — PILOCARPINE HCL 5 MG PO TABS: ORAL_TABLET | ORAL | 2 refills | Status: AC

## 2024-02-01 NOTE — Telephone Encounter (Signed)
 Refill request received via fax from [facility] [employer] for Pilocarpine   Last Fill: 02/16/2023  Next Visit: 02/14/2024  Last Visit: 09/14/2023  Dx: Sjogren's syndrome with keratoconjunctivitis sicca   Current Dose per office note on 09/14/2023: Not discussed  Okay to refill Pilocarpine ?

## 2024-02-08 ENCOUNTER — Other Ambulatory Visit: Payer: Self-pay | Admitting: Rheumatology

## 2024-02-08 NOTE — Telephone Encounter (Signed)
 Last Fill: 11/09/2023  Eye exam: 08/03/2022   Labs: 09/08/2023 Creatinine remains elevated-1.22 and Gfr is low-53--improved from June. Rest of CMP WNL.   CBC WNL  Next Visit: 02/14/2024  Last Visit: 09/14/2023  DX: Polymyositis    Current Dose per office note 09/14/2023: Plaquenil  200 mg p.o. daily a   Left message for patient to advise she is due for PLQ eye exam  Okay to refill Plaquenil ?

## 2024-02-14 ENCOUNTER — Ambulatory Visit: Attending: Physician Assistant | Admitting: Physician Assistant

## 2024-02-14 ENCOUNTER — Encounter: Payer: Self-pay | Admitting: Physician Assistant

## 2024-02-14 VITALS — BP 108/70 | HR 83 | Temp 97.8°F | Resp 14 | Ht 68.0 in | Wt 213.0 lb

## 2024-02-14 DIAGNOSIS — N1831 Chronic kidney disease, stage 3a: Secondary | ICD-10-CM

## 2024-02-14 DIAGNOSIS — M3501 Sicca syndrome with keratoconjunctivitis: Secondary | ICD-10-CM | POA: Diagnosis not present

## 2024-02-14 DIAGNOSIS — M19072 Primary osteoarthritis, left ankle and foot: Secondary | ICD-10-CM

## 2024-02-14 DIAGNOSIS — M3320 Polymyositis, organ involvement unspecified: Secondary | ICD-10-CM | POA: Diagnosis not present

## 2024-02-14 DIAGNOSIS — M7062 Trochanteric bursitis, left hip: Secondary | ICD-10-CM

## 2024-02-14 DIAGNOSIS — M8589 Other specified disorders of bone density and structure, multiple sites: Secondary | ICD-10-CM

## 2024-02-14 DIAGNOSIS — Z7952 Long term (current) use of systemic steroids: Secondary | ICD-10-CM | POA: Diagnosis not present

## 2024-02-14 DIAGNOSIS — M7061 Trochanteric bursitis, right hip: Secondary | ICD-10-CM

## 2024-02-14 DIAGNOSIS — Z96652 Presence of left artificial knee joint: Secondary | ICD-10-CM

## 2024-02-14 DIAGNOSIS — M51369 Other intervertebral disc degeneration, lumbar region without mention of lumbar back pain or lower extremity pain: Secondary | ICD-10-CM | POA: Diagnosis not present

## 2024-02-14 DIAGNOSIS — Z79899 Other long term (current) drug therapy: Secondary | ICD-10-CM

## 2024-02-14 DIAGNOSIS — M19041 Primary osteoarthritis, right hand: Secondary | ICD-10-CM | POA: Diagnosis not present

## 2024-02-14 DIAGNOSIS — M19042 Primary osteoarthritis, left hand: Secondary | ICD-10-CM

## 2024-02-14 DIAGNOSIS — M160 Bilateral primary osteoarthritis of hip: Secondary | ICD-10-CM

## 2024-02-14 DIAGNOSIS — Z8639 Personal history of other endocrine, nutritional and metabolic disease: Secondary | ICD-10-CM

## 2024-02-14 DIAGNOSIS — M1711 Unilateral primary osteoarthritis, right knee: Secondary | ICD-10-CM

## 2024-02-14 DIAGNOSIS — I7300 Raynaud's syndrome without gangrene: Secondary | ICD-10-CM

## 2024-02-14 DIAGNOSIS — M19071 Primary osteoarthritis, right ankle and foot: Secondary | ICD-10-CM | POA: Diagnosis not present

## 2024-02-15 ENCOUNTER — Ambulatory Visit: Payer: Self-pay | Admitting: Physician Assistant

## 2024-02-15 NOTE — Progress Notes (Signed)
 Creatinine remains elevated but stable. GFR is low-51-avoid the use of NSAIDs.   CK WNL-great news!!

## 2024-02-15 NOTE — Progress Notes (Signed)
 Trace ketones in urine.  CBC WNL. ESR WNL Hgb A1c 5.5%.  please forward results to PCP.

## 2024-02-15 NOTE — Telephone Encounter (Signed)
 Patient called the office back , advised that Complements are WNL and SPEP is still pending. Patient verbalized understanding

## 2024-02-15 NOTE — Progress Notes (Signed)
 Complements WNL. SPEP pending

## 2024-02-16 LAB — CBC WITH DIFFERENTIAL/PLATELET
Absolute Lymphocytes: 1943 {cells}/uL (ref 850–3900)
Absolute Monocytes: 383 {cells}/uL (ref 200–950)
Basophils Absolute: 41 {cells}/uL (ref 0–200)
Basophils Relative: 0.8 %
Eosinophils Absolute: 61 {cells}/uL (ref 15–500)
Eosinophils Relative: 1.2 %
HCT: 39.6 % (ref 35.9–46.0)
Hemoglobin: 12.5 g/dL (ref 11.7–15.5)
MCH: 28.7 pg (ref 27.0–33.0)
MCHC: 31.6 g/dL (ref 31.6–35.4)
MCV: 90.8 fL (ref 81.4–101.7)
MPV: 9.9 fL (ref 7.5–12.5)
Monocytes Relative: 7.5 %
Neutro Abs: 2672 {cells}/uL (ref 1500–7800)
Neutrophils Relative %: 52.4 %
Platelets: 222 Thousand/uL (ref 140–400)
RBC: 4.36 Million/uL (ref 3.80–5.10)
RDW: 12.8 % (ref 11.0–15.0)
Total Lymphocyte: 38.1 %
WBC: 5.1 Thousand/uL (ref 3.8–10.8)

## 2024-02-16 LAB — COMPREHENSIVE METABOLIC PANEL WITH GFR
AG Ratio: 1.6 (calc) (ref 1.0–2.5)
ALT: 7 U/L (ref 6–29)
AST: 14 U/L (ref 10–35)
Albumin: 3.9 g/dL (ref 3.6–5.1)
Alkaline phosphatase (APISO): 55 U/L (ref 37–153)
BUN/Creatinine Ratio: 7 (calc) (ref 6–22)
BUN: 9 mg/dL (ref 7–25)
CO2: 26 mmol/L (ref 20–32)
Calcium: 9 mg/dL (ref 8.6–10.4)
Chloride: 108 mmol/L (ref 98–110)
Creat: 1.24 mg/dL — ABNORMAL HIGH (ref 0.50–1.03)
Globulin: 2.4 g/dL (ref 1.9–3.7)
Glucose, Bld: 96 mg/dL (ref 65–99)
Potassium: 3.9 mmol/L (ref 3.5–5.3)
Sodium: 142 mmol/L (ref 135–146)
Total Bilirubin: 0.5 mg/dL (ref 0.2–1.2)
Total Protein: 6.3 g/dL (ref 6.1–8.1)
eGFR: 51 mL/min/1.73m2 — ABNORMAL LOW

## 2024-02-16 LAB — PROTEIN ELECTROPHORESIS, SERUM, WITH REFLEX
Albumin ELP: 3.7 g/dL — ABNORMAL LOW (ref 3.8–4.8)
Alpha 1: 0.2 g/dL (ref 0.2–0.3)
Alpha 2: 0.6 g/dL (ref 0.5–0.9)
Beta 2: 0.4 g/dL (ref 0.2–0.5)
Beta Globulin: 0.4 g/dL (ref 0.4–0.6)
Gamma Globulin: 1 g/dL (ref 0.8–1.7)
Total Protein: 6.4 g/dL (ref 6.1–8.1)

## 2024-02-16 LAB — URINALYSIS, ROUTINE W REFLEX MICROSCOPIC
Bilirubin Urine: NEGATIVE
Glucose, UA: NEGATIVE
Hgb urine dipstick: NEGATIVE
Leukocytes,Ua: NEGATIVE
Nitrite: NEGATIVE
Protein, ur: NEGATIVE
Specific Gravity, Urine: 1.019 (ref 1.001–1.035)
pH: 5.5 (ref 5.0–8.0)

## 2024-02-16 LAB — C3 AND C4
C3 Complement: 109 mg/dL (ref 83–193)
C4 Complement: 26 mg/dL (ref 15–57)

## 2024-02-16 LAB — SEDIMENTATION RATE: Sed Rate: 9 mm/h (ref 0–30)

## 2024-02-16 LAB — HEMOGLOBIN A1C
Hgb A1c MFr Bld: 5.5 %
Mean Plasma Glucose: 111 mg/dL
eAG (mmol/L): 6.2 mmol/L

## 2024-02-16 LAB — CK: Total CK: 141 U/L (ref 21–240)

## 2024-02-17 NOTE — Progress Notes (Signed)
SPEP did not reveal any abnormal protein bands.

## 2024-02-20 ENCOUNTER — Other Ambulatory Visit: Payer: Self-pay | Admitting: Rheumatology

## 2024-02-21 NOTE — Telephone Encounter (Signed)
 Last Fill: 11/25/2023  Labs: 02/14/2024 Creatinine remains elevated but stable. GFR is low-51-avoid the use of NSAIDs.   CK WNL-great news!! Complements WNL. SPEP did not reveal any abnormal protein bands.   Next Visit: 07/14/2024  Last Visit: 02/14/2024  DX: Polymyositis   Current Dose per office note on 02/14/2024: Imuran  50 mg 3 tablets by mouth daily   Okay to refill Imuran ?

## 2024-03-09 ENCOUNTER — Other Ambulatory Visit: Payer: Self-pay | Admitting: Rheumatology

## 2024-03-09 NOTE — Telephone Encounter (Signed)
 Last Fill: 11/09/2023  Next Visit: 07/14/2024  Last Visit: 02/14/2024  Dx: Polymyositis   Current Dose per office note on 02/14/2024: prednisone  6 mg daily.   Okay to refill Prednisone ?

## 2024-03-09 NOTE — Telephone Encounter (Signed)
 Last Fill: 11/09/2023  Next Visit: 07/14/2024  Last Visit: 02/14/2024  DX: Polymyositis   Current Dose per office note on 02/14/2024: prednisone  6 mg daily.   Okay to refill prednisone  1mg  tablets?

## 2024-03-15 ENCOUNTER — Other Ambulatory Visit: Payer: Self-pay | Admitting: Rheumatology

## 2024-03-16 NOTE — Telephone Encounter (Signed)
 Last Fill: 02/08/2024  Eye exam: 08/03/2022, updated on 10/25/2023 but we have not received report.    Labs: 02/14/2024 Creatinine remains elevated but stable. GFR is low-51-avoid the use of NSAIDs.   CK WNL-great news!! Complements WNL. SPEP did not reveal any abnormal protein bands.   Next Visit: 07/14/2024  Last Visit: 02/14/2024  IK:[id number]   Current Dose per office note on 02/14/2024: Plaquenil  200 mg p.o. daily.  Attempted to contact [facility] and left message on machine to request eye exam.   Okay to refill Plaquenil ?

## 2024-07-14 ENCOUNTER — Ambulatory Visit: Admitting: Physician Assistant
